# Patient Record
Sex: Male | Born: 1970 | Race: White | Hispanic: No | Marital: Single | Smoking: Former smoker
Health system: Southern US, Community
[De-identification: ages and names within clinical notes are randomized; demographics above are authoritative.]

## PROBLEM LIST (undated history)

## (undated) DIAGNOSIS — J449 Chronic obstructive pulmonary disease, unspecified: Secondary | ICD-10-CM

## (undated) DIAGNOSIS — F909 Attention-deficit hyperactivity disorder, unspecified type: Secondary | ICD-10-CM

## (undated) DIAGNOSIS — R011 Cardiac murmur, unspecified: Secondary | ICD-10-CM

## (undated) DIAGNOSIS — K219 Gastro-esophageal reflux disease without esophagitis: Secondary | ICD-10-CM

## (undated) DIAGNOSIS — J45909 Unspecified asthma, uncomplicated: Secondary | ICD-10-CM

## (undated) HISTORY — PX: OTHER SURGICAL HISTORY: SHX169

---

## 1999-03-24 ENCOUNTER — Emergency Department (HOSPITAL_COMMUNITY): Admission: EM | Admit: 1999-03-24 | Discharge: 1999-03-24 | Payer: Self-pay | Admitting: Emergency Medicine

## 2000-02-27 ENCOUNTER — Emergency Department (HOSPITAL_COMMUNITY): Admission: EM | Admit: 2000-02-27 | Discharge: 2000-02-27 | Payer: Self-pay | Admitting: Emergency Medicine

## 2000-02-27 ENCOUNTER — Encounter: Payer: Self-pay | Admitting: Emergency Medicine

## 2000-03-01 ENCOUNTER — Emergency Department (HOSPITAL_COMMUNITY): Admission: EM | Admit: 2000-03-01 | Discharge: 2000-03-01 | Payer: Self-pay | Admitting: Emergency Medicine

## 2000-12-05 ENCOUNTER — Emergency Department (HOSPITAL_COMMUNITY): Admission: EM | Admit: 2000-12-05 | Discharge: 2000-12-05 | Payer: Self-pay | Admitting: Emergency Medicine

## 2001-06-04 ENCOUNTER — Inpatient Hospital Stay (HOSPITAL_COMMUNITY): Admission: EM | Admit: 2001-06-04 | Discharge: 2001-06-11 | Payer: Self-pay | Admitting: Psychiatry

## 2001-06-04 ENCOUNTER — Encounter: Payer: Self-pay | Admitting: Emergency Medicine

## 2001-06-12 ENCOUNTER — Other Ambulatory Visit (HOSPITAL_COMMUNITY): Admission: RE | Admit: 2001-06-12 | Discharge: 2001-06-21 | Payer: Self-pay | Admitting: Psychiatry

## 2003-03-18 ENCOUNTER — Emergency Department (HOSPITAL_COMMUNITY): Admission: EM | Admit: 2003-03-18 | Discharge: 2003-03-18 | Payer: Self-pay | Admitting: Emergency Medicine

## 2003-03-18 ENCOUNTER — Encounter: Payer: Self-pay | Admitting: Emergency Medicine

## 2006-02-22 ENCOUNTER — Encounter: Payer: Self-pay | Admitting: Emergency Medicine

## 2007-08-21 ENCOUNTER — Emergency Department (HOSPITAL_COMMUNITY): Admission: EM | Admit: 2007-08-21 | Discharge: 2007-08-21 | Payer: Self-pay | Admitting: Emergency Medicine

## 2008-10-16 ENCOUNTER — Emergency Department (HOSPITAL_COMMUNITY): Admission: EM | Admit: 2008-10-16 | Discharge: 2008-10-16 | Payer: Self-pay | Admitting: Emergency Medicine

## 2010-05-21 ENCOUNTER — Observation Stay (HOSPITAL_COMMUNITY): Admission: EM | Admit: 2010-05-21 | Discharge: 2010-05-22 | Payer: Self-pay | Admitting: Emergency Medicine

## 2010-08-02 ENCOUNTER — Emergency Department (HOSPITAL_COMMUNITY): Admission: EM | Admit: 2010-08-02 | Discharge: 2010-08-02 | Payer: Self-pay | Admitting: Emergency Medicine

## 2011-01-08 LAB — CULTURE, BLOOD (ROUTINE X 2): Culture: NO GROWTH

## 2011-01-08 LAB — BASIC METABOLIC PANEL
BUN: 15 mg/dL (ref 6–23)
CO2: 25 mEq/L (ref 19–32)
Calcium: 9.2 mg/dL (ref 8.4–10.5)
Chloride: 104 mEq/L (ref 96–112)
Creatinine, Ser: 0.75 mg/dL (ref 0.4–1.5)
GFR calc Af Amer: >60 mL/min (ref >60)
GFR calc non Af Amer: >60 mL/min (ref >60)
Glucose, Bld: 127 mg/dL — ABNORMAL HIGH (ref 70–99)
Potassium: 4 mEq/L (ref 3.5–5.1)
Sodium: 137 mEq/L (ref 135–145)

## 2011-01-08 LAB — DIFFERENTIAL
Basophils Absolute: 0.1 10*3/uL (ref 0.0–0.1)
Basophils Relative: 1 % (ref 0–1)
Eosinophils Absolute: 0.1 10*3/uL (ref 0.0–0.7)
Eosinophils Relative: 0 % (ref 0–5)
Lymphocytes Relative: 15 % (ref 12–46)
Lymphs Abs: 2.5 10*3/uL (ref 0.7–4.0)
Monocytes Absolute: 1 10*3/uL (ref 0.1–1.0)
Monocytes Relative: 6 % (ref 3–12)
Neutro Abs: 13.3 10*3/uL — ABNORMAL HIGH (ref 1.7–7.7)
Neutrophils Relative %: 78 % — ABNORMAL HIGH (ref 43–77)

## 2011-01-08 LAB — CBC
HCT: 42.1 % (ref 39.0–52.0)
Hemoglobin: 14.4 g/dL (ref 13.0–17.0)
MCH: 32.8 pg (ref 26.0–34.0)
MCHC: 34.2 g/dL (ref 30.0–36.0)
MCV: 95.9 fL (ref 78.0–100.0)
Platelets: 257 10*3/uL (ref 150–400)
RBC: 4.39 MIL/uL (ref 4.22–5.81)
RDW: 14.8 % (ref 11.5–15.5)
WBC: 17 10*3/uL — ABNORMAL HIGH (ref 4.0–10.5)

## 2011-03-08 NOTE — Consult Note (Signed)
[name], [name]           ACCOUNT NO.:  [id number]   MEDICAL RECORD NO.:  [id number]          PATIENT TYPE:  EMS   LOCATION:  ED                           FACILITY:  [facility]   PHYSICIAN:  Randall Post, MD    DATE OF BIRTH:  1971/05/22   DATE OF CONSULTATION:  10/16/2008  DATE OF DISCHARGE:                                 CONSULTATION   CHIEF COMPLAINT:  Right knee laceration.   HISTORY:  [name] is a 40 year old man who cut his right  anterior knee on a storm door.  He works in Production designer, theatre/television/film.  This was not  an on-the-job injury.  He complained of acute sharp pain directly over  the knee.  It hurts when he bends it.  It is better when he keeps it  straight.  He describes the pain is sharp, and it is non-radiating.  He  denies any numbness or any other injuries.  The laceration occurred  almost 18 hours ago.   PAST MEDICAL HISTORY:  No diabetes or cardiac disease.   FAMILY HISTORY:  No diabetes or cardiac disease.   SOCIAL HISTORY:  He smokes one pack per day.  A tobacco intervention  session was performed.  He has a prescription for Chantix, and is  interested in quitting.  He is going to plan to quit after the New Year.  We have discussed the implications of tobacco use and the potential  complications with wound healing.  He understands and is going to quit.   REVIEW OF SYSTEMS:  A 14-point review of systems was performed and was  otherwise negative, with the exception of the musculoskeletal laceration  as above.  He has not had any fevers or chills.   PHYSICAL EXAMINATION:  CONSTITUTIONAL:  He is alert and oriented, in no  acute distress, and is well developed, well nourished.  EYES:  His extraocular movements are intact.  LYMPHATIC:  He has no cervical or axillary lymphadenopathy.  CARDIOVASCULAR:  He has no pedal edema.  RESPIRATORY:  He has no increase in respiratory efforts.  PSYCHIATRIC:  His judgment and insight are intact.  SKIN:  He has a  laceration over the anterior right knee.  NEUROLOGIC:  His sensation is intact distally throughout both lower  extremities.  MUSCULOSKELETAL:  His straight leg raise is intact, and his extensor  mechanism is intact.  He has a laceration down into the prepatellar  bursa.  This does not involve the joint.  He has no joint effusion.   X-RAYS:  X-rays demonstrate soft tissue wound anterior to the patella,  but there is no air in the joint, and no evidence for arthrotomy.  There  is no bony defect   IMPRESSION:  Right prepatellar laceration.   PLAN:  IV antibiotics in the emergency room.  Local anesthetic, followed  by a copious pulse lavage irrigation here in the emergency room.  Loose  wound closure, with p.o. antibiotics to go home, and a knee immobilizer.  Return to clinic with me on Monday for a wound check.      Randall Post, MD  Electronically  Signed     JPL/MEDQ  D:  10/16/2008  T:  10/17/2008  Job:  [id number]
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
## 2012-12-05 ENCOUNTER — Encounter (HOSPITAL_COMMUNITY): Payer: Self-pay | Admitting: *Deleted

## 2012-12-05 ENCOUNTER — Emergency Department (HOSPITAL_COMMUNITY)
Admission: EM | Admit: 2012-12-05 | Discharge: 2012-12-05 | Disposition: A | Discharge status: Home / Self Care | Payer: BC Managed Care – PPO | Attending: Emergency Medicine | Admitting: Emergency Medicine

## 2012-12-05 ENCOUNTER — Emergency Department (HOSPITAL_COMMUNITY): Payer: BC Managed Care – PPO

## 2012-12-05 DIAGNOSIS — F909 Attention-deficit hyperactivity disorder, unspecified type: Secondary | ICD-10-CM | POA: Insufficient documentation

## 2012-12-05 DIAGNOSIS — Z79899 Other long term (current) drug therapy: Secondary | ICD-10-CM | POA: Insufficient documentation

## 2012-12-05 DIAGNOSIS — J45901 Unspecified asthma with (acute) exacerbation: Secondary | ICD-10-CM

## 2012-12-05 DIAGNOSIS — F17200 Nicotine dependence, unspecified, uncomplicated: Secondary | ICD-10-CM | POA: Insufficient documentation

## 2012-12-05 HISTORY — DX: Attention-deficit hyperactivity disorder, unspecified type: F90.9

## 2012-12-05 HISTORY — DX: Unspecified asthma, uncomplicated: J45.909

## 2012-12-05 MED ORDER — PREDNISONE 20 MG PO TABS
60.0000 mg | ORAL_TABLET | Freq: Once | ORAL | Status: AC
  Administered 2012-12-05: 60 mg via ORAL
  Filled 2012-12-05: qty 3

## 2012-12-05 MED ORDER — PREDNISONE 20 MG PO TABS: ORAL_TABLET | ORAL | Status: DC

## 2012-12-05 MED ORDER — ALBUTEROL SULFATE (5 MG/ML) 0.5% IN NEBU
5.0000 mg | INHALATION_SOLUTION | Freq: Once | RESPIRATORY_TRACT | Status: AC
  Administered 2012-12-05: 5 mg via RESPIRATORY_TRACT
  Filled 2012-12-05: qty 1

## 2012-12-05 MED ORDER — IPRATROPIUM BROMIDE 0.02 % IN SOLN
0.5000 mg | Freq: Once | RESPIRATORY_TRACT | Status: AC
  Administered 2012-12-05: 0.5 mg via RESPIRATORY_TRACT
  Filled 2012-12-05: qty 2.5

## 2012-12-05 MED ORDER — ALBUTEROL SULFATE HFA 108 (90 BASE) MCG/ACT IN AERS
2.0000 | INHALATION_SPRAY | RESPIRATORY_TRACT | Status: DC | PRN
  Administered 2012-12-05: 2 via RESPIRATORY_TRACT
  Filled 2012-12-05: qty 6.7

## 2012-12-05 NOTE — ED Notes (Signed)
Pt with hx of asthma to ED c/o increased wheezing and sob x 1 month that has gotten worse in the past 3 days.  Audible wheezing noted, labored breathing, sats of 96%.

## 2012-12-05 NOTE — ED Provider Notes (Signed)
History     CSN: [id number]  Arrival date & time 12/05/12  1719   First MD Initiated Contact with Patient 12/05/12 1733      Chief Complaint  Patient presents with  . Asthma    (Consider location/radiation/quality/duration/timing/severity/associated sxs/prior treatment) Patient is a 42 y.o. male presenting with asthma. The history is provided by the patient.  Asthma This is a recurrent problem. The current episode started more than 1 week ago (Pt has a hard time breathing for about a week.  It has gotten worse in the past 2 days.  He has a history of asthma.). The problem occurs constantly. The problem has been gradually worsening. Associated symptoms include shortness of breath. Nothing aggravates the symptoms. Nothing relieves the symptoms. He has tried nothing for the symptoms.    Past Medical History  Diagnosis Date  . Asthma   . ADHD (attention deficit hyperactivity disorder)     Past Surgical History  Procedure Laterality Date  . Cervical fracture repair      No family history on file.  History  Substance Use Topics  . Smoking status: Current Every Day Smoker -- 1.00 packs/day    Types: Cigarettes  . Smokeless tobacco: Not on file  . Alcohol Use: No      Review of Systems  Constitutional: Negative.   HENT: Negative.   Eyes: Negative.   Respiratory: Positive for shortness of breath.   Cardiovascular: Negative.   Gastrointestinal: Negative.   Endocrine: Negative.   Genitourinary: Negative.   Neurological: Negative.   Psychiatric/Behavioral: Negative.     Allergies  Review of patient's allergies indicates no known allergies.  Home Medications   Current Outpatient Rx  Name  Route  Sig  Dispense  Refill  . albuterol (PROVENTIL HFA;VENTOLIN HFA) 108 (90 BASE) MCG/ACT inhaler   Inhalation   Inhale 2 puffs into the lungs every 6 (six) hours as needed for wheezing. For shortness of breath         . amphetamine-dextroamphetamine (ADDERALL XR) 30 MG 24  hr capsule   Oral   Take 60 mg by mouth every morning.         . citalopram (CELEXA) 10 MG tablet   Oral   Take 10 mg by mouth daily.         Marland Kitchen guaiFENesin (MUCINEX) 600 MG 12 hr tablet   Oral   Take 1,200 mg by mouth 2 (two) times daily.         Marland Kitchen ibuprofen (ADVIL,MOTRIN) 200 MG tablet   Oral   Take 400 mg by mouth every 6 (six) hours as needed for pain. For pain         . Multiple Vitamin (MULTIVITAMIN WITH MINERALS) TABS   Oral   Take 1 tablet by mouth daily.         Marland Kitchen oxymetazoline (AFRIN) 0.05 % nasal spray   Nasal   Place 2 sprays into the nose 2 (two) times daily.           BP 118/81  Pulse 116  Temp(Src) 99 F (37.2 C) (Oral)  Resp 20  SpO2 96%  Physical Exam  Nursing note and vitals reviewed. Constitutional: He is oriented to person, place, and time. He appears well-developed and well-nourished.  With audible wheezing.    HENT:  Head: Normocephalic and atraumatic.  Right Ear: External ear normal.  Left Ear: External ear normal.  Mouth/Throat: Oropharynx is clear and moist.  Eyes: Conjunctivae and EOM are normal. Pupils are  equal, round, and reactive to light.  Neck: Normal range of motion. Neck supple.  Cardiovascular: Normal rate, regular rhythm and normal heart sounds.   Pulmonary/Chest: Effort normal. He has wheezes.  Bilateral wheezing.    Abdominal: Soft. Bowel sounds are normal.  Musculoskeletal: Normal range of motion. He exhibits no edema and no tenderness.  Neurological: He is alert and oriented to person, place, and time.  No sensory or motor deficit.  Skin: Skin is warm and dry.  Psychiatric: He has a normal mood and affect. His behavior is normal.    ED Course  Procedures (including critical care time)  Labs Reviewed - No data to display Dg Chest 2 View  12/05/2012  *RADIOLOGY REPORT*  Clinical Data: Asthma and shortness of breath.  CHEST - 2 VIEW  Comparison: None.  Findings: The heart size is normal.  Mild perihilar  bronchitic change is present.  No focal airspace consolidation is evident. The visualized soft tissues and bony thorax are unremarkable.  IMPRESSION:  1.  Mild perihilar bronchitic changes are compatible with asthma. 2.  No focal airspace consolidation.   Original Report Authenticated By: Marin Roberts, M.D.    7:39 PM Has persistent wheezing after two albuterol treatments and 60 mg of prednisone.  Ordered a third nebulizer Rx.    1. Asthma attack            Carleene Cooper III, MD 12/05/12 2120

## 2014-01-05 ENCOUNTER — Emergency Department (HOSPITAL_COMMUNITY): Payer: 59

## 2014-01-05 ENCOUNTER — Encounter (HOSPITAL_COMMUNITY): Payer: Self-pay | Admitting: Emergency Medicine

## 2014-01-05 ENCOUNTER — Emergency Department (HOSPITAL_COMMUNITY)
Admission: EM | Admit: 2014-01-05 | Discharge: 2014-01-05 | Disposition: A | Discharge status: Home / Self Care | Payer: 59 | Attending: Emergency Medicine | Admitting: Emergency Medicine

## 2014-01-05 DIAGNOSIS — F17200 Nicotine dependence, unspecified, uncomplicated: Secondary | ICD-10-CM | POA: Insufficient documentation

## 2014-01-05 DIAGNOSIS — Z8659 Personal history of other mental and behavioral disorders: Secondary | ICD-10-CM | POA: Insufficient documentation

## 2014-01-05 DIAGNOSIS — J45901 Unspecified asthma with (acute) exacerbation: Secondary | ICD-10-CM | POA: Insufficient documentation

## 2014-01-05 DIAGNOSIS — Z79899 Other long term (current) drug therapy: Secondary | ICD-10-CM | POA: Insufficient documentation

## 2014-01-05 DIAGNOSIS — J40 Bronchitis, not specified as acute or chronic: Secondary | ICD-10-CM

## 2014-01-05 LAB — CBC WITH DIFFERENTIAL/PLATELET
Basophils Absolute: 0 10*3/uL (ref 0.0–0.1)
Basophils Relative: 0 % (ref 0–1)
Eosinophils Absolute: 0.2 10*3/uL (ref 0.0–0.7)
Eosinophils Relative: 2 % (ref 0–5)
HCT: 39 % (ref 39.0–52.0)
Hemoglobin: 13.6 g/dL (ref 13.0–17.0)
Lymphocytes Relative: 40 % (ref 12–46)
Lymphs Abs: 4.4 10*3/uL — ABNORMAL HIGH (ref 0.7–4.0)
MCH: 31.4 pg (ref 26.0–34.0)
MCHC: 34.9 g/dL (ref 30.0–36.0)
MCV: 90.1 fL (ref 78.0–100.0)
Monocytes Absolute: 0.8 10*3/uL (ref 0.1–1.0)
Monocytes Relative: 7 % (ref 3–12)
Neutro Abs: 5.7 10*3/uL (ref 1.7–7.7)
Neutrophils Relative %: 51 % (ref 43–77)
Platelets: 312 10*3/uL (ref 150–400)
RBC: 4.33 MIL/uL (ref 4.22–5.81)
RDW: 13.6 % (ref 11.5–15.5)
WBC: 11.2 10*3/uL — ABNORMAL HIGH (ref 4.0–10.5)

## 2014-01-05 LAB — COMPREHENSIVE METABOLIC PANEL
ALT: 51 U/L (ref 0–53)
AST: 32 U/L (ref 0–37)
Albumin: 3.6 g/dL (ref 3.5–5.2)
Alkaline Phosphatase: 115 U/L (ref 39–117)
BUN: 19 mg/dL (ref 6–23)
CO2: 30 mEq/L (ref 19–32)
Calcium: 9.4 mg/dL (ref 8.4–10.5)
Chloride: 103 mEq/L (ref 96–112)
Creatinine, Ser: 1.01 mg/dL (ref 0.50–1.35)
GFR calc Af Amer: >90 mL/min (ref >90)
GFR calc non Af Amer: 89 mL/min — ABNORMAL LOW (ref >90)
Glucose, Bld: 98 mg/dL (ref 70–99)
Potassium: 4.5 mEq/L (ref 3.7–5.3)
Sodium: 144 mEq/L (ref 137–147)
Total Bilirubin: <0.2 mg/dL — ABNORMAL LOW (ref 0.3–1.2)
Total Protein: 6.7 g/dL (ref 6.0–8.3)

## 2014-01-05 LAB — TROPONIN I: Troponin I: <0.3 ng/mL (ref <0.30)

## 2014-01-05 MED ORDER — ALBUTEROL SULFATE HFA 108 (90 BASE) MCG/ACT IN AERS
2.0000 | INHALATION_SPRAY | RESPIRATORY_TRACT | Status: DC | PRN
  Administered 2014-01-05: 2 via RESPIRATORY_TRACT
  Filled 2014-01-05: qty 6.7

## 2014-01-05 MED ORDER — AEROCHAMBER PLUS W/MASK MISC
Status: AC
  Administered 2014-01-05: 1
  Filled 2014-01-05: qty 1

## 2014-01-05 MED ORDER — PREDNISONE 20 MG PO TABS: 60.0000 mg | ORAL_TABLET | Freq: Every day | ORAL | Status: DC

## 2014-01-05 MED ORDER — IPRATROPIUM BROMIDE 0.02 % IN SOLN
0.5000 mg | Freq: Once | RESPIRATORY_TRACT | Status: AC
  Administered 2014-01-05: 0.5 mg via RESPIRATORY_TRACT
  Filled 2014-01-05: qty 2.5

## 2014-01-05 MED ORDER — ALBUTEROL SULFATE (2.5 MG/3ML) 0.083% IN NEBU
5.0000 mg | INHALATION_SOLUTION | Freq: Once | RESPIRATORY_TRACT | Status: AC
  Administered 2014-01-05: 5 mg via RESPIRATORY_TRACT
  Filled 2014-01-05: qty 6

## 2014-01-05 MED ORDER — PREDNISONE 20 MG PO TABS
60.0000 mg | ORAL_TABLET | Freq: Once | ORAL | Status: AC
  Administered 2014-01-05: 60 mg via ORAL
  Filled 2014-01-05: qty 3

## 2014-01-05 NOTE — ED Notes (Signed)
PT completed breathing treatment. Still sounds tight.

## 2014-01-05 NOTE — ED Notes (Signed)
Dr. Bernette MayersSheldon notified about respiratory assessment on pt, states will get chest x-ray and assess from there.

## 2014-01-05 NOTE — Discharge Instructions (Signed)
Bronchitis °Bronchitis is inflammation of the airways that extend from the windpipe into the lungs (bronchi). The inflammation often causes mucus to develop, which leads to a cough. If the inflammation becomes severe, it may cause shortness of breath. °CAUSES  °Bronchitis may be caused by:  °· Viral infections.   °· Bacteria.   °· Cigarette smoke.   °· Allergens, pollutants, and other irritants.   °SIGNS AND SYMPTOMS  °The most common symptom of bronchitis is a frequent cough that produces mucus. Other symptoms include: °· Fever.   °· Body aches.   °· Chest congestion.   °· Chills.   °· Shortness of breath.   °· Sore throat.   °DIAGNOSIS  °Bronchitis is usually diagnosed through a medical history and physical exam. Tests, such as chest X-rays, are sometimes done to rule out other conditions.  °TREATMENT  °You may need to avoid contact with whatever caused the problem (smoking, for example). Medicines are sometimes needed. These may include: °· Antibiotics. These may be prescribed if the condition is caused by bacteria. °· Cough suppressants. These may be prescribed for relief of cough symptoms.   °· Inhaled medicines. These may be prescribed to help open your airways and make it easier for you to breathe.   °· Steroid medicines. These may be prescribed for those with recurrent (chronic) bronchitis. °HOME CARE INSTRUCTIONS °· Get plenty of rest.   °· Drink enough fluids to keep your urine clear or pale yellow (unless you have a medical condition that requires fluid restriction). Increasing fluids may help thin your secretions and will prevent dehydration.   °· Only take over-the-counter or prescription medicines as directed by your health care provider. °· Only take antibiotics as directed. Make sure you finish them even if you start to feel better. °· Avoid secondhand smoke, irritating chemicals, and strong fumes. These will make bronchitis worse. If you are a smoker, quit smoking. Consider using nicotine gum or  skin patches to help control withdrawal symptoms. Quitting smoking will help your lungs heal faster.   °· Put a cool-mist humidifier in your bedroom at night to moisten the air. This may help loosen mucus. Change the water in the humidifier daily. You can also run the hot water in your shower and sit in the bathroom with the door closed for 5 10 minutes.   °· Follow up with your health care provider as directed.   °· Wash your hands frequently to avoid catching bronchitis again or spreading an infection to others.   °SEEK MEDICAL CARE IF: °Your symptoms do not improve after 1 week of treatment.  °SEEK IMMEDIATE MEDICAL CARE IF: °· Your fever increases. °· You have chills.   °· You have chest pain.   °· You have worsening shortness of breath.   °· You have bloody sputum. °· You faint.   °· You have lightheadedness. °· You have a severe headache.   °· You vomit repeatedly. °MAKE SURE YOU:  °· Understand these instructions. °· Will watch your condition. °· Will get help right away if you are not doing well or get worse. °Document Released: 10/10/2005 Document Revised: 07/31/2013 Document Reviewed: 06/04/2013 °ExitCare® Patient Information ©2014 [employer]. ° ° °Emergency Department Resource Guide °1) Find a Doctor and Pay Out of Pocket °Although you won't have to find out who is covered by your insurance plan, it is a good idea to ask around and get recommendations. You will then need to call the office and see if the doctor you have chosen will accept you as a new patient and what types of options they offer for patients who are self-pay. Some doctors   offer discounts or will set up payment plans for their patients who do not have insurance, but you will need to ask so you aren't surprised when you get to your appointment. ° °2) Contact Your Local Health Department °Not all health departments have doctors that can see patients for sick visits, but many do, so it is worth a call to see if yours does. If you don't  know where your local health department is, you can check in your phone book. The CDC also has a tool to help you locate your state's health department, and many state websites also have listings of all of their local health departments. ° °3) Find a Walk-in Clinic °If your illness is not likely to be very severe or complicated, you may want to try a walk in clinic. These are popping up all over the country in pharmacies, drugstores, and shopping centers. They're usually staffed by nurse practitioners or physician assistants that have been trained to treat common illnesses and complaints. They're usually fairly quick and inexpensive. However, if you have serious medical issues or chronic medical problems, these are probably not your best option. ° °No Primary Care Doctor: °- Call [facility] at  832-8000 - they can help you locate a primary care doctor that  accepts your insurance, provides certain services, etc. °- Physician Referral Service- 1-800-533-3463 ° °Chronic Pain Problems: °Organization         Address  Phone   Notes  °[facility]  (336) 297-2271 Patients need to be referred by their primary care doctor.  ° °Medication Assistance: °Organization         Address  Phone   Notes  °[place] [facility] [address] °[place], [place] [address] (336) 641-8030 --Must be a resident of [place] °-- Must have NO insurance coverage whatsoever (no Medicaid/ Medicare, etc.) °-- The pt. MUST have a primary care doctor that directs their care regularly and follows them in the community °  °[facility]  (866) 331-1348   °[employer]  (888) 892-1162   ° °Agencies that provide inexpensive medical care: °Organization         Address  Phone   Notes  °[facility]  (336) 832-8035   °[facility]    (336) 832-7272   °[facility] [address] °[place], [place] [address] (336) 832-4777   °[facility] [address], °[place] (336) 271-4999   °[facility]    (336) 373-0678   °[facility]    (336) 272-1050   °[facility] ° [address], [place] Phone:  (336) 832-4444, Fax:  (336) 832-4440 Hours of Operation:  9 am - 6 pm, M-F.  Also accepts Medicaid/Medicare and self-pay.  °[facility] ° [address], [place] Phone: (336) 832-3150, Fax: (336) 832-3151. Hours of Operation:  8:30 am - 5:30 pm, M-F.  Also accepts Medicaid and self-pay.  °[facility] [place] [address], [place] Phone: (336) 878-6027   °[facility] [address], [place], [place] (336)723-1848, Ext. 123 Mondays & Thursdays: 7-9 AM.  First 15 patients are seen on a first come, first serve basis. °  ° °Medicaid-accepting [place] Providers: ° °Organization         Address  Phone   Notes  °[facility] [address], [place] (336) 641-2100 Also accepts self-pay patients.  °[facility] [address], [place] ° (336) 856-9996   °[facility] [address], [place] (336) 288-8857   °[facility] [address] [place] [address], [place] (336) 299-7000   °Veita Bland [address], [place]  ° (336) 373-1557 Only accepts [place] Access Medicaid patients after they have their name applied to their card.  ° °Self-Pay (no insurance) in [place]: ° °Organization         Address  Phone   Notes  °Sickle Cell Patients, [facility] [address], [place] (336) 832-1970   °[facility] [address], [place] (336) 832-4400   °[facility] ° [address], [place] (336) 992-4800   °[facility]/Dr. Osei-Bonsu ° [address] [place] [address], [place] or [address], [place] (336) 841-8500 Phone  number for both [place] and [place] locations is the same.  °[facility] [address], [place] (336) 299-0000   °[facility] [address] [place] [address], [place] or [address] (336) 852-7530 °(336) 878-2260   °[facility] [address], [place] (336) 350-1642, phone; (336) 294-5005, fax Sees patients 1st and 3rd Saturday of every month.  Must not qualify for public or private insurance (i.e. Medicaid, Medicare, [place] Health Choice, Veterans' Benefits) • Household income should be no more than 200% of the poverty level •The clinic cannot treat you if you are pregnant or think you are pregnant • Sexually transmitted diseases are not treated at the clinic.  ° ° °Dental Care: °Organization         Address  Phone  Notes  °[facility] [address], [place] (336) 641-6152 Accepts children up to age 21 who are enrolled in Medicaid or [place] Health Choice; pregnant women with a Medicaid card; and children who have applied for Medicaid or [place] Health Choice, but were declined, whose parents can pay a reduced fee at time of service.  °[facility] [place]  [address], [place] (336) 641-7733 Accepts children up to age 21 who are enrolled in Medicaid or [place] Health Choice; pregnant women with a Medicaid card; and children who have applied for Medicaid or [place] Health Choice, but were declined, whose parents can pay a reduced fee at time of service.  °[facility] ° [address],  (336) 641-4533 Patients are seen by appointment only. Walk-ins are not accepted. [facility] will see patients 18 years of age and older. °Monday - Tuesday (8am-5pm) °Most Wednesdays (8:30-5pm) °$30 per visit, cash only  °[facility] ° [address], [place] (336) 641-4533 Patients are seen by appointment only.  Walk-ins are not accepted. [facility] will see patients 18 years of age and older. °One Wednesday Evening (Monthly: Volunteer Based).  $30 per visit, cash only  °[facility]  (919) 537-3737 for adults; Children under age 4, call Graduate Pediatric Dentistry at ([phone]) [phone]. Children aged 4-14, please call (919) [phone] to request a pediatric application. ° Dental services are provided in all areas of dental care including fillings, crowns and bridges, complete and partial dentures, implants, gum treatment, root canals, and extractions. Preventive care   is also provided. Treatment is provided to both adults and children. °Patients are selected via a lottery and there is often a waiting list. °  °[facility] [address], °[place] ° (336) 763-8833 [email or web address] °  °[facility] [address], [place], [place] (336)723-1848, Ext. 123 Second and Fourth Thursday of each month, opens at 6:30 AM; Clinic ends at 9 AM.  Patients are seen on a first-come first-served basis, and a limited number are seen during each clinic.  ° °[facility] ° [address], [place], [place] (336) 723-7904   Eligibility Requirements °You must have lived in [place], [place], or [place] counties for at least the last three months. °  You cannot be eligible for state or federal sponsored healthcare insurance, including Veterans Administration, Medicaid, or Medicare. °  You generally cannot be eligible for healthcare insurance through your employer.  °  How to apply: °Eligibility screenings are held every Tuesday and Wednesday afternoon from 1:00 pm until 4:00 pm. You do not need an appointment for the interview!  °[facility] [address], [place], [place] 336-631-2330   °[facility]  336-342-8273   °[facility]  336-703-3100   °[facility]  336-570-6415   ° °Behavioral Health  Resources in the Community: °Intensive Outpatient Programs °Organization         Address  Phone  Notes  °[facility] [address], [place], [place] 336-878-6098   °[facility] [address], [place], [place] 336-832-9800   °[facility] [address], [place], [place] ° 336-882-2125   °[facility] [address],  °[place], [place] 1-800-853-5163 or 336-641-4981   °Substance Abuse Resources °Organization         Address  Phone  Notes  °[facility]  336-882-2125   °[facility]  336-784-9470   °[facility]  336-285-9073   °[facility]  336-845-3988   °Residential & Outpatient Substance Abuse Program  1-800-659-3381   °Psychological Services °Organization         Address  Phone  Notes  ° Health  336- 832-9600   °[facility]  336- 378-7881   °[facility] [address], [place] 1-800-853-5163 or 336-641-4981   ° °Mobile Crisis Teams °Organization         Address  Phone  Notes  °[facility], Mobile Crisis Care Unit  1-877-626-1772   °Assertive °[facility] ° [address] [place], [place] 336-834-9664   °Sharon DeEsch [address] °[place] 336-554-5454   ° °Self-Help/Support Groups °Organization         Address  Phone             Notes  °[facility] [place] - variety of support groups  336- 373-1402 Call for more information  °Narcotics Anonymous (NA), [facility] [address], °[place]  2 meetings at this location  ° °Residential Treatment Programs °Organization         Address  Phone  Notes  °[facility] [address],    °[place]  1-866-801-8205   °[facility] ° [address], [place], [place] 704-293-8524   °[facility] [address], [place] 336-845-3988 Admissions: 8am-3pm M-F  °[facility] [address]    °  [place], [place] 336-841-1104   °[facility] [address], [place], [place] 336-379-7146   °[facility] [address]  °[place], [place] 336-285-9073   °[facility] Intensive Outpatient [address], [place], [place] 336-852-3033   °[facility] [address]  °[place], [place] 1-877-615-2722 or 336-784-9470   °[facility] [address] [place], [place] 336-227-7417 Accepts Medicaid  °[facility] [address]  °[place] 1-800-659-3381 Substance Abuse/Addiction Treatment  ° °[place] Behavioral Health Resources °Organization         Address  Phone  Notes  °[facility]  (888) 581-9988   °Julie Brannon, PhD [address] [place], [place]   (336) 349-5553 or (336) 951-0000   °[facility]   [address] °[place], [place] (336) 349-4454   °[facility] [address], [place], [place] (336) 342-8316 Insurance/Medicaid/sponsorship through [facility]  °[facility] [address]                                    [place], [place] (336) 342-8316 Therapy/tele-psych/case  °[facility] [address]  ° [place], [place] (336) 349-2233    °Dr. Arfeen  (336) 349-4544   °[facility] [place]  [employer] [place] [facility] 1) [address], [place] °2) [address], [place] °3)  [address], [place] (336) 349-3220 °(336) 342-7768 ° °(336) 342-8140   °[place] [facility] (336) 342-1394 or (336) 342-3537 (After Hours)    ° ° ° °

## 2014-01-05 NOTE — ED Provider Notes (Signed)
CSN: [id number]     Arrival date & time 01/05/14  1817 History   First MD Initiated Contact with Patient 01/05/14 1838     Chief Complaint  Patient presents with  . Chest Injury     (Consider location/radiation/quality/duration/timing/severity/associated sxs/prior Treatment) HPI Pt with history of mild-intermittent asthma and ongoing tobacco abuse reports 2 days of wheezing, SOB, cough productive of yellow sputum and chest soreness which is worse with coughing. No leg swelling or travel. No history of HTN, DM, HLD or CAD.   Past Medical History  Diagnosis Date  . Asthma   . ADHD (attention deficit hyperactivity disorder)    Past Surgical History  Procedure Laterality Date  . Cervical fracture repair     No family history on file. History  Substance Use Topics  . Smoking status: Current Every Day Smoker -- 1.00 packs/day    Types: Cigarettes  . Smokeless tobacco: Not on file  . Alcohol Use: No    Review of Systems  All other systems reviewed and are negative except as noted in HPI.    Allergies  Review of patient's allergies indicates no known allergies.  Home Medications   Current Outpatient Rx  Name  Route  Sig  Dispense  Refill  . albuterol (PROVENTIL HFA;VENTOLIN HFA) 108 (90 BASE) MCG/ACT inhaler   Inhalation   Inhale 2 puffs into the lungs every 6 (six) hours as needed for wheezing. For shortness of breath         . ibuprofen (ADVIL,MOTRIN) 200 MG tablet   Oral   Take 400 mg by mouth every 6 (six) hours as needed for pain. For pain         . Multiple Vitamin (MULTIVITAMIN WITH MINERALS) TABS   Oral   Take 1 tablet by mouth daily.         Marland Kitchen oxymetazoline (AFRIN) 0.05 % nasal spray   Nasal   Place 2 sprays into the nose 2 (two) times daily.          BP 128/74  Pulse 98  Temp(Src) 98.8 F (37.1 C)  Resp 20  Ht 5\' 7"  (1.702 m)  Wt 159 lb (72.122 kg)  BMI 24.90 kg/m2  SpO2 95% Physical Exam  Nursing note and vitals  reviewed. Constitutional: He is oriented to person, place, and time. He appears well-developed and well-nourished.  HENT:  Head: Normocephalic and atraumatic.  Eyes: EOM are normal. Pupils are equal, round, and reactive to light.  Neck: Normal range of motion. Neck supple.  Cardiovascular: Normal rate, normal heart sounds and intact distal pulses.   Pulmonary/Chest: Effort normal. No respiratory distress. He has wheezes. He has no rales. He exhibits no tenderness.  Abdominal: Bowel sounds are normal. He exhibits no distension. There is no tenderness.  Musculoskeletal: Normal range of motion. He exhibits no edema and no tenderness.  Neurological: He is alert and oriented to person, place, and time. He has normal strength. No cranial nerve deficit or sensory deficit.  Skin: Skin is warm and dry. No rash noted.  Psychiatric: He has a normal mood and affect.    ED Course  Procedures (including critical care time) Labs Review Labs Reviewed  CBC WITH DIFFERENTIAL - Abnormal; Notable for the following:    WBC 11.2 (*)    Lymphs Abs 4.4 (*)    All other components within normal limits  COMPREHENSIVE METABOLIC PANEL - Abnormal; Notable for the following:    Total Bilirubin <0.2 (*)    GFR calc  non Af Amer 89 (*)    All other components within normal limits  TROPONIN I   Imaging Review Dg Chest 2 View  01/05/2014   CLINICAL DATA:  Pain post trauma  EXAM: CHEST  2 VIEW  COMPARISON:  December 05, 2012  FINDINGS: The lungs are clear. The heart size and pulmonary vascularity are normal. No adenopathy. No pneumothorax. No bone lesions. There is a linear metallic foreign body either overlying or in the lower neck midline.  IMPRESSION: The near metallic foreign body either overlying or in the lower midline neck region. Lungs clear. No pneumothorax.   Electronically Signed   By: Bretta BangWilliam  Woodruff M.D.   On: 01/05/2014 20:10  metal from previous cervical spine surgery  EKG not in MUSE:  Date:  01/05/2014  Rate: 106  Rhythm: sinus tachycardia  QRS Axis: normal  Intervals: normal  ST/T Wave abnormalities: normal  Conduction Disutrbances:right bundle branch block, incomplete  Narrative Interpretation:   Old EKG Reviewed: none available    MDM   Final diagnoses:  Bronchitis    Pt with symptoms consistent with asthma/bronchitis. Labs and CXR ordered in triage are in process, in the meantime will give a neb and prednisone and reassess for symptom improvement.   8:54 PM Labs and imaging reviewed and unremarkable. Low risk for CAD, PE or other acute life threatening cardiac or pulmonary process. Likely a viral bronchitis, advised HFA, prednisone, stopping smoking and establishing with a PCP.   Charles B. Bernette MayersSheldon, MD 01/05/14 2055

## 2014-01-05 NOTE — ED Notes (Signed)
The pt is c/o mid-chest pain for 2 days with sob.  Productive cough.  He has a cold

## 2014-10-30 ENCOUNTER — Emergency Department (HOSPITAL_COMMUNITY)
Admission: EM | Admit: 2014-10-30 | Discharge: 2014-10-30 | Disposition: A | Discharge status: Home / Self Care | Payer: 59 | Source: Home / Self Care | Attending: Emergency Medicine | Admitting: Emergency Medicine

## 2014-10-30 ENCOUNTER — Ambulatory Visit (HOSPITAL_COMMUNITY): Payer: 59 | Attending: Emergency Medicine

## 2014-10-30 ENCOUNTER — Encounter (HOSPITAL_COMMUNITY): Payer: Self-pay | Admitting: *Deleted

## 2014-10-30 DIAGNOSIS — S99921A Unspecified injury of right foot, initial encounter: Secondary | ICD-10-CM

## 2014-10-30 DIAGNOSIS — R05 Cough: Secondary | ICD-10-CM | POA: Insufficient documentation

## 2014-10-30 DIAGNOSIS — R918 Other nonspecific abnormal finding of lung field: Secondary | ICD-10-CM | POA: Insufficient documentation

## 2014-10-30 DIAGNOSIS — R509 Fever, unspecified: Secondary | ICD-10-CM | POA: Diagnosis present

## 2014-10-30 DIAGNOSIS — J111 Influenza due to unidentified influenza virus with other respiratory manifestations: Secondary | ICD-10-CM

## 2014-10-30 DIAGNOSIS — S99929A Unspecified injury of unspecified foot, initial encounter: Secondary | ICD-10-CM

## 2014-10-30 DIAGNOSIS — R059 Cough, unspecified: Secondary | ICD-10-CM

## 2014-10-30 LAB — POCT RAPID STREP A: Streptococcus, Group A Screen (Direct): NEGATIVE

## 2014-10-30 MED ORDER — IPRATROPIUM-ALBUTEROL 0.5-2.5 (3) MG/3ML IN SOLN
RESPIRATORY_TRACT | Status: AC
  Filled 2014-10-30: qty 3

## 2014-10-30 MED ORDER — OSELTAMIVIR PHOSPHATE 75 MG PO CAPS: 75.0000 mg | ORAL_CAPSULE | Freq: Two times a day (BID) | ORAL | Status: DC

## 2014-10-30 MED ORDER — ACETAMINOPHEN 325 MG PO TABS
650.0000 mg | ORAL_TABLET | Freq: Once | ORAL | Status: AC
  Administered 2014-10-30: 650 mg via ORAL

## 2014-10-30 MED ORDER — IPRATROPIUM-ALBUTEROL 0.5-2.5 (3) MG/3ML IN SOLN
3.0000 mL | Freq: Once | RESPIRATORY_TRACT | Status: AC
  Administered 2014-10-30: 3 mL via RESPIRATORY_TRACT

## 2014-10-30 MED ORDER — HYDROCODONE-HOMATROPINE 5-1.5 MG/5ML PO SYRP: 5.0000 mL | ORAL_SOLUTION | Freq: Four times a day (QID) | ORAL | Status: DC | PRN

## 2014-10-30 MED ORDER — ACETAMINOPHEN 325 MG PO TABS
ORAL_TABLET | ORAL | Status: AC
  Filled 2014-10-30: qty 2

## 2014-10-30 MED ORDER — ALBUTEROL SULFATE HFA 108 (90 BASE) MCG/ACT IN AERS: 2.0000 | INHALATION_SPRAY | RESPIRATORY_TRACT | Status: DC | PRN

## 2014-10-30 NOTE — Discharge Instructions (Signed)
You have the flu. Take Tamiflu 1 pill twice a day for 5 days. Use the albuterol inhaler or nebulizer every 4 hours as needed. Use Hycodan cough syrup as needed for cough. This medicine has a narcotic in it so do not drive all taking it. You should start to feel better in the next 3-4 days. The fatigue may last longer.  If you develop trouble breathing, chest pain, are getting worse please go to the emergency room.

## 2014-10-30 NOTE — ED Notes (Addendum)
Pt  Reports     Non  Productive   Cough         X  3  Days  With  sorethroat   /   Body  Aches              He     Is  Sitting  Upright on  The  Exam table          Pt  Masked  And  Is  In a  Private  room

## 2014-10-30 NOTE — ED Provider Notes (Signed)
CSN: [id number]     Arrival date & time 10/30/14  1157 History   None    Chief Complaint  Patient presents with  . Fever   (Consider location/radiation/quality/duration/timing/severity/associated sxs/prior Treatment) HPI  He is a 44 year old man here with his wife for evaluation of fever and cough. His symptoms started about 3 days ago with fever, cough, sore throat, congestion. His symptoms are gradually worsening. He has had an episode of vomiting yesterday as well as several episodes of diarrhea. He reports some stomach pain with coughing.  He also reports headaches and body aches. He did not get a flu shot this year. He is a smoker. He has not had any cigarettes in the last 4 days and is planning on quitting.  He also would like to be evaluated for right toe pain. He was going to the bathroom last night when he kicked the door. He has pain and bruising in his right little toe.  Past Medical History  Diagnosis Date  . Asthma   . ADHD (attention deficit hyperactivity disorder)    Past Surgical History  Procedure Laterality Date  . Cervical fracture repair     History reviewed. No pertinent family history. History  Substance Use Topics  . Smoking status: Current Every Day Smoker -- 1.00 packs/day    Types: Cigarettes  . Smokeless tobacco: Not on file  . Alcohol Use: No    Review of Systems  Constitutional: Positive for fever. Negative for chills.  HENT: Positive for congestion, rhinorrhea and sore throat.   Respiratory: Positive for cough and wheezing. Negative for shortness of breath.   Cardiovascular: Negative for chest pain.  Gastrointestinal: Positive for vomiting, abdominal pain and diarrhea.  Musculoskeletal: Positive for myalgias.  Neurological: Positive for headaches. Negative for dizziness.    Allergies  Review of patient's allergies indicates no known allergies.  Home Medications   Prior to Admission medications   Medication Sig Start Date End Date Taking?  Authorizing Provider  albuterol (PROVENTIL HFA;VENTOLIN HFA) 108 (90 BASE) MCG/ACT inhaler Inhale 2 puffs into the lungs every 4 (four) hours as needed for wheezing or shortness of breath. 10/30/14   Charm RingsErin J Ola Raap, MD  HYDROcodone-homatropine (HYCODAN) 5-1.5 MG/5ML syrup Take 5 mLs by mouth every 6 (six) hours as needed for cough. 10/30/14   Charm RingsErin J Ladan Vanderzanden, MD  ibuprofen (ADVIL,MOTRIN) 200 MG tablet Take 400 mg by mouth every 6 (six) hours as needed for pain. For pain    Historical Provider, MD  Multiple Vitamin (MULTIVITAMIN WITH MINERALS) TABS Take 1 tablet by mouth daily.    Historical Provider, MD  oseltamivir (TAMIFLU) 75 MG capsule Take 1 capsule (75 mg total) by mouth every 12 (twelve) hours. 10/30/14   Charm RingsErin J Keysean Savino, MD  predniSONE (DELTASONE) 20 MG tablet Take 3 tablets (60 mg total) by mouth daily with breakfast. 01/05/14   Charles B. Bernette MayersSheldon, MD  RANITIDINE HCL PO Take 1 tablet by mouth daily as needed (for indigestion).    Historical Provider, MD   BP 118/73 mmHg  Pulse 147  Temp(Src) 103 F (39.4 C) (Oral)  Resp 16  SpO2 96% Physical Exam  Constitutional: He is oriented to person, place, and time. He appears well-developed and well-nourished. No distress.  HENT:  Head: Normocephalic and atraumatic.  Right Ear: External ear normal.  Left Ear: External ear normal.  Nose: Nose normal.  Mouth/Throat: Mucous membranes are not dry. Oropharyngeal exudate and posterior oropharyngeal erythema present.  Neck: Neck supple.  Cardiovascular: Normal rate,  regular rhythm and normal heart sounds.   No murmur heard. Pulmonary/Chest: Effort normal. No respiratory distress. He has wheezes (worse in the bases). He has no rales.  Bibasilar rhonchi  Lymphadenopathy:    He has no cervical adenopathy.  Neurological: He is alert and oriented to person, place, and time.    ED Course  Procedures (including critical care time) Labs Review Labs Reviewed  POCT RAPID STREP A [facility] URG CARE ONLY)    Imaging  Review Dg Chest 2 View  10/30/2014   CLINICAL DATA:  Cough.  Fever.  EXAM: CHEST  2 VIEW  COMPARISON:  01/05/2014  FINDINGS: There is peribronchial thickening consistent with bronchitis. There are no infiltrates or effusions. Heart size and vascularity are normal. No acute osseous abnormality.  IMPRESSION: Bronchitic changes.   Electronically Signed   By: Geanie Cooley M.D.   On: 10/30/2014 15:02   Dg Toe 5th Right  10/30/2014   CLINICAL DATA:  Stubbed right fifth toe yesterday, right fifth toe pain  EXAM: RIGHT FIFTH TOE  COMPARISON:  None.  FINDINGS: There is no evidence of fracture or dislocation. There is no evidence of arthropathy or other focal bone abnormality. Soft tissues are unremarkable.  IMPRESSION: Negative.   Electronically Signed   By: Natasha Mead M.D.   On: 10/30/2014 14:59     MDM   1. Influenza   2. Toe injury   3. Cough   4. Fever    Tylenol 650 mg given orally for fever. Also treated with a DuoNeb.  Subjective improvement after DuoNeb.  X-ray negative for pneumonia. Toe is not broken. Symptomatically treatment for toe with ice and ibuprofen. He can buddy tape it for comfort if needed. He likely has the flu. Treat with Tamiflu for 5 days. Albuterol and Hycodan given for symptomatic control. Follow-up if symptoms worsen, develops shortness of breath, or chest pain.    Charm Rings, MD 10/30/14 [phone]

## 2014-11-01 LAB — CULTURE, GROUP A STREP

## 2017-01-09 DIAGNOSIS — J018 Other acute sinusitis: Secondary | ICD-10-CM | POA: Diagnosis not present

## 2017-05-17 DIAGNOSIS — J189 Pneumonia, unspecified organism: Secondary | ICD-10-CM | POA: Diagnosis not present

## 2017-06-29 DIAGNOSIS — J0140 Acute pansinusitis, unspecified: Secondary | ICD-10-CM | POA: Diagnosis not present

## 2017-06-29 DIAGNOSIS — J209 Acute bronchitis, unspecified: Secondary | ICD-10-CM | POA: Diagnosis not present

## 2017-06-29 DIAGNOSIS — J189 Pneumonia, unspecified organism: Secondary | ICD-10-CM | POA: Diagnosis not present

## 2017-07-05 DIAGNOSIS — Z23 Encounter for immunization: Secondary | ICD-10-CM | POA: Diagnosis not present

## 2018-09-22 ENCOUNTER — Encounter (HOSPITAL_COMMUNITY): Payer: Self-pay | Admitting: Emergency Medicine

## 2018-09-22 ENCOUNTER — Emergency Department (HOSPITAL_COMMUNITY): Payer: BLUE CROSS/BLUE SHIELD

## 2018-09-22 ENCOUNTER — Other Ambulatory Visit: Payer: Self-pay

## 2018-09-22 ENCOUNTER — Emergency Department (HOSPITAL_COMMUNITY)
Admission: EM | Admit: 2018-09-22 | Discharge: 2018-09-22 | Disposition: A | Discharge status: Home / Self Care | Payer: BLUE CROSS/BLUE SHIELD | Attending: Emergency Medicine | Admitting: Emergency Medicine

## 2018-09-22 ENCOUNTER — Ambulatory Visit (HOSPITAL_COMMUNITY)
Admission: EM | Admit: 2018-09-22 | Discharge: 2018-09-22 | Disposition: A | Discharge status: Nursing Home (Includes ICF) | Payer: Self-pay

## 2018-09-22 DIAGNOSIS — F17210 Nicotine dependence, cigarettes, uncomplicated: Secondary | ICD-10-CM | POA: Insufficient documentation

## 2018-09-22 DIAGNOSIS — J069 Acute upper respiratory infection, unspecified: Secondary | ICD-10-CM | POA: Diagnosis not present

## 2018-09-22 DIAGNOSIS — R079 Chest pain, unspecified: Secondary | ICD-10-CM | POA: Diagnosis not present

## 2018-09-22 DIAGNOSIS — R05 Cough: Secondary | ICD-10-CM | POA: Diagnosis not present

## 2018-09-22 DIAGNOSIS — J4541 Moderate persistent asthma with (acute) exacerbation: Secondary | ICD-10-CM | POA: Diagnosis not present

## 2018-09-22 DIAGNOSIS — Z79899 Other long term (current) drug therapy: Secondary | ICD-10-CM | POA: Insufficient documentation

## 2018-09-22 DIAGNOSIS — R0602 Shortness of breath: Secondary | ICD-10-CM | POA: Diagnosis not present

## 2018-09-22 LAB — BASIC METABOLIC PANEL
Anion gap: 6 (ref 5–15)
BUN: 18 mg/dL (ref 6–20)
CO2: 25 mmol/L (ref 22–32)
Calcium: 9.1 mg/dL (ref 8.9–10.3)
Chloride: 104 mmol/L (ref 98–111)
Creatinine, Ser: 0.93 mg/dL (ref 0.61–1.24)
GFR calc Af Amer: >60 mL/min (ref >60)
GFR calc non Af Amer: >60 mL/min (ref >60)
Glucose, Bld: 99 mg/dL (ref 70–99)
Potassium: 3.9 mmol/L (ref 3.5–5.1)
Sodium: 135 mmol/L (ref 135–145)

## 2018-09-22 LAB — CBC WITH DIFFERENTIAL/PLATELET
Abs Immature Granulocytes: 0.02 10*3/uL (ref 0.00–0.07)
Basophils Absolute: 0 10*3/uL (ref 0.0–0.1)
Basophils Relative: 0 %
Eosinophils Absolute: 0.3 10*3/uL (ref 0.0–0.5)
Eosinophils Relative: 3 %
HCT: 41.7 % (ref 39.0–52.0)
Hemoglobin: 13.2 g/dL (ref 13.0–17.0)
Immature Granulocytes: 0 %
Lymphocytes Relative: 31 %
Lymphs Abs: 2.7 10*3/uL (ref 0.7–4.0)
MCH: 29.1 pg (ref 26.0–34.0)
MCHC: 31.7 g/dL (ref 30.0–36.0)
MCV: 91.9 fL (ref 80.0–100.0)
Monocytes Absolute: 0.7 10*3/uL (ref 0.1–1.0)
Monocytes Relative: 8 %
Neutro Abs: 5.1 10*3/uL (ref 1.7–7.7)
Neutrophils Relative %: 58 %
Platelets: 331 10*3/uL (ref 150–400)
RBC: 4.54 MIL/uL (ref 4.22–5.81)
RDW: 14 % (ref 11.5–15.5)
WBC: 8.9 10*3/uL (ref 4.0–10.5)
nRBC: 0 % (ref 0.0–0.2)

## 2018-09-22 LAB — I-STAT TROPONIN, ED: Troponin i, poc: 0 ng/mL (ref 0.00–0.08)

## 2018-09-22 MED ORDER — ALBUTEROL SULFATE HFA 108 (90 BASE) MCG/ACT IN AERS
2.0000 | INHALATION_SPRAY | Freq: Once | RESPIRATORY_TRACT | Status: AC
  Administered 2018-09-22: 2 via RESPIRATORY_TRACT
  Filled 2018-09-22: qty 6.7

## 2018-09-22 MED ORDER — SODIUM CHLORIDE 0.9 % IV BOLUS
1000.0000 mL | Freq: Once | INTRAVENOUS | Status: AC
  Administered 2018-09-22: 1000 mL via INTRAVENOUS

## 2018-09-22 MED ORDER — PREDNISONE 20 MG PO TABS: ORAL_TABLET | ORAL | 0 refills | Status: DC

## 2018-09-22 MED ORDER — PREDNISONE 20 MG PO TABS
60.0000 mg | ORAL_TABLET | Freq: Once | ORAL | Status: AC
  Administered 2018-09-22: 60 mg via ORAL
  Filled 2018-09-22: qty 3

## 2018-09-22 MED ORDER — IPRATROPIUM BROMIDE 0.02 % IN SOLN
0.5000 mg | Freq: Once | RESPIRATORY_TRACT | Status: AC
  Administered 2018-09-22: 0.5 mg via RESPIRATORY_TRACT
  Filled 2018-09-22: qty 2.5

## 2018-09-22 MED ORDER — ALBUTEROL SULFATE (2.5 MG/3ML) 0.083% IN NEBU
5.0000 mg | INHALATION_SOLUTION | Freq: Once | RESPIRATORY_TRACT | Status: AC
  Administered 2018-09-22: 5 mg via RESPIRATORY_TRACT
  Filled 2018-09-22: qty 6

## 2018-09-22 NOTE — ED Triage Notes (Signed)
Patient arrived from home, c/o of SOB and CP 4/10, staes he checked his SpO2 at [facility] and it was 87%. Hx of asthma, did a albuterol treatment PTA. NAD. AOx4.

## 2018-09-22 NOTE — ED Provider Notes (Signed)
[facility] Provider Note   CSN: [id number] Arrival date & time: 09/22/18  1542     History   Chief Complaint Chief Complaint  Patient presents with  . Shortness of Breath  . Chest Pain    HPI [name] is a 47 y.o. male.  HPI  47 year old male with a history of asthma and prior smoking presents with shortness of breath, cough, and chest tightness.  This started around 1 week ago and has significant he worsened starting 3 days ago.  He is been having chest congestion and wheezing.  A little bit of a sore throat a couple days ago but that has resolved.  No leg swelling.  No fevers at this time.  [facility] with an oxygen saturation of 87% and told to come to the ER.  He is been feeling short of breath more and more and has significant trouble with exertion.  Past Medical History:  Diagnosis Date  . ADHD (attention deficit hyperactivity disorder)   . Asthma     There are no active problems to display for this patient.   Past Surgical History:  Procedure Laterality Date  . cervical fracture repair          Home Medications    Prior to Admission medications   Medication Sig Start Date End Date Taking? Authorizing Provider  albuterol (PROVENTIL HFA;VENTOLIN HFA) 108 (90 BASE) MCG/ACT inhaler Inhale 2 puffs into the lungs every 4 (four) hours as needed for wheezing or shortness of breath. 10/30/14   Charm RingsHonig, Erin J, MD  HYDROcodone-homatropine [facility](HYCODAN) 5-1.5 MG/5ML syrup Take 5 mLs by mouth every 6 (six) hours as needed for cough. 10/30/14   Charm RingsHonig, Erin J, MD  ibuprofen (ADVIL,MOTRIN) 200 MG tablet Take 400 mg by mouth every 6 (six) hours as needed for pain. For pain    [provider]  Multiple Vitamin (MULTIVITAMIN WITH MINERALS) TABS Take 1 tablet by mouth daily.    [provider]  oseltamivir (TAMIFLU) 75 MG capsule Take 1 capsule (75 mg total) by mouth every 12 (twelve)  hours. 10/30/14   Charm RingsHonig, Erin J, MD  predniSONE (DELTASONE) 20 MG tablet 2 tabs po daily x 4 days 09/23/18   Pricilla LovelessGoldston, Naida Escalante, MD  RANITIDINE HCL PO Take 1 tablet by mouth daily as needed (for indigestion).    [provider]    Family History History reviewed. No pertinent family history.  Social History Social History   Tobacco Use  . Smoking status: Current Every Day Smoker    Packs/day: 1.00    Types: Cigarettes  Substance Use Topics  . Alcohol use: No  . Drug use: No     Allergies   Patient has no known allergies.   Review of Systems Review of Systems  Constitutional: Negative for fever.  HENT: Positive for congestion and sore throat.   Respiratory: Positive for cough, chest tightness, shortness of breath and wheezing.   Cardiovascular: Negative for chest pain and leg swelling.  All other systems reviewed and are negative.    Physical Exam Updated Vital Signs BP 124/70   Pulse 85   Temp 98.4 F (36.9 C) (Oral)   Resp 17   Ht 5\' 6"  (1.676 m)   Wt 84.4 kg   SpO2 98%   BMI 30.02 kg/m   Physical Exam  Constitutional: He appears well-developed and well-nourished.  Non-toxic appearance. He does not appear ill. No distress.  HENT:  Head: Normocephalic and atraumatic.  Right Ear: External ear normal.  Left Ear: External ear normal.  Nose: Nose normal.  Eyes: Right eye exhibits no discharge. Left eye exhibits no discharge.  Neck: Neck supple.  Cardiovascular: Normal rate, regular rhythm and normal heart sounds.  Pulmonary/Chest: Effort normal. No accessory muscle usage. No tachypnea. He has wheezes (diffuse, expiratory).  Abdominal: Soft. There is no tenderness.  Musculoskeletal: He exhibits no edema.       Right lower leg: He exhibits no edema.       Left lower leg: He exhibits no edema.  Neurological: He is alert.  Skin: Skin is warm and dry.  Psychiatric: His mood appears not anxious.  Nursing note and vitals reviewed.    ED Treatments /  Results  Labs (all labs ordered are listed, but only abnormal results are displayed) Labs Reviewed  BASIC METABOLIC PANEL  CBC WITH DIFFERENTIAL/PLATELET  I-STAT TROPONIN, ED    EKG EKG Interpretation  Date/Time:  Saturday September 22 2018 15:53:29 EST Ventricular Rate:  84 PR Interval:    QRS Duration: 122 QT Interval:  359 QTC Calculation: 425 R Axis:   79 Text Interpretation:  Sinus rhythm Right bundle branch block similar to earlier in the day Confirmed by Pricilla Loveless [phone]) on 09/22/2018 4:19:00 PM   Radiology Dg Chest 2 View  Result Date: 09/22/2018 CLINICAL DATA:  Shortness of breath with chest tightness and cough 1 week. EXAM: CHEST - 2 VIEW COMPARISON:  10/30/2014 FINDINGS: Lungs are adequately inflated without focal airspace consolidation or effusion. Cardiomediastinal silhouette and remainder of the exam is unchanged. IMPRESSION: No active cardiopulmonary disease. Electronically Signed   By: Elberta Fortis M.D.   On: 09/22/2018 17:13    Procedures Procedures (including critical care time)  Medications Ordered in ED Medications  albuterol (PROVENTIL HFA;VENTOLIN HFA) 108 (90 Base) MCG/ACT inhaler 2 puff (has no administration in time range)  sodium chloride 0.9 % bolus 1,000 mL (0 mLs Intravenous Stopped 09/22/18 1732)  predniSONE (DELTASONE) tablet 60 mg (60 mg Oral Given 09/22/18 1607)  albuterol (PROVENTIL) (2.5 MG/3ML) 0.083% nebulizer solution 5 mg (5 mg Nebulization Given 09/22/18 1608)  albuterol (PROVENTIL) (2.5 MG/3ML) 0.083% nebulizer solution 5 mg (5 mg Nebulization Given 09/22/18 1732)  ipratropium (ATROVENT) nebulizer solution 0.5 mg (0.5 mg Nebulization Given 09/22/18 1732)     Initial Impression / Assessment and Plan / ED Course  I have reviewed the triage vital signs and the nursing notes.  Pertinent labs & imaging results that were available during my care of the patient were reviewed by me and considered in my medical decision making (see  chart for details).     Patient feels better with albuterol treatments.  His symptoms appear to be bronchitis/asthma exacerbation.  He has some chest tightness but I doubt ACS.  Given length of symptoms with negative troponin I do not think further troponin testing is needed.  I doubt PE or dissection as well.  He has not been in hypoxic while in the emergency department.  He walked to the bathroom and felt well.  No indication for antibiotics as there is no obvious bacterial illness.  He appears stable for discharge home and has been given an albuterol inhaler and started on steroid burst.  We discussed the need to follow-up with a PCP as well as return precautions.  Final Clinical Impressions(s) / ED Diagnoses   Final diagnoses:  Moderate persistent asthma with exacerbation    ED Discharge Orders  Ordered    predniSONE (DELTASONE) 20 MG tablet     09/22/18 1841           Pricilla Loveless, MD 09/22/18 1853

## 2018-09-22 NOTE — ED Notes (Signed)
ED Provider at bedside. 

## 2018-09-22 NOTE — Discharge Instructions (Signed)
Use the albuterol inhaler 1-2 puffs every 4 hours as needed for cough or shortness of breath.  If you develop worsening shortness of breath, cough, dizziness, fever, chest pain, or any other new/concerning symptoms and return to the ER for evaluation.

## 2018-09-22 NOTE — ED Notes (Signed)
Patient verbalizes understanding of discharge instructions. Opportunity for questioning and answers were provided. Armband removed by staff, pt discharged from ED.  

## 2018-09-22 NOTE — ED Notes (Signed)
Pt presents with chest tightness and pain, weakness, nausea and low O2 sats.  Provider notified, EKG done and pt put on 6 Lts of oxygen to get O2 sats up  Pt wheeled down to ED per provider orders

## 2018-10-11 ENCOUNTER — Encounter: Payer: Self-pay | Admitting: Family Medicine

## 2018-10-11 ENCOUNTER — Ambulatory Visit (INDEPENDENT_AMBULATORY_CARE_PROVIDER_SITE_OTHER): Payer: BLUE CROSS/BLUE SHIELD | Admitting: Family Medicine

## 2018-10-11 VITALS — BP 112/68 | HR 88 | Temp 97.8°F | Ht 66.0 in | Wt 170.0 lb

## 2018-10-11 DIAGNOSIS — J4541 Moderate persistent asthma with (acute) exacerbation: Secondary | ICD-10-CM

## 2018-10-11 DIAGNOSIS — J3489 Other specified disorders of nose and nasal sinuses: Secondary | ICD-10-CM

## 2018-10-11 DIAGNOSIS — K219 Gastro-esophageal reflux disease without esophagitis: Secondary | ICD-10-CM

## 2018-10-11 DIAGNOSIS — Z8739 Personal history of other diseases of the musculoskeletal system and connective tissue: Secondary | ICD-10-CM

## 2018-10-11 DIAGNOSIS — M1990 Unspecified osteoarthritis, unspecified site: Secondary | ICD-10-CM | POA: Diagnosis not present

## 2018-10-11 DIAGNOSIS — F1011 Alcohol abuse, in remission: Secondary | ICD-10-CM

## 2018-10-11 DIAGNOSIS — Z87891 Personal history of nicotine dependence: Secondary | ICD-10-CM

## 2018-10-11 DIAGNOSIS — J302 Other seasonal allergic rhinitis: Secondary | ICD-10-CM

## 2018-10-11 DIAGNOSIS — R062 Wheezing: Secondary | ICD-10-CM | POA: Diagnosis not present

## 2018-10-11 DIAGNOSIS — Z7689 Persons encountering health services in other specified circumstances: Secondary | ICD-10-CM

## 2018-10-11 MED ORDER — IPRATROPIUM-ALBUTEROL 0.5-2.5 (3) MG/3ML IN SOLN
3.0000 mL | Freq: Once | RESPIRATORY_TRACT | Status: AC
  Administered 2018-10-11: 3 mL via RESPIRATORY_TRACT

## 2018-10-11 MED ORDER — FLUTICASONE-SALMETEROL 100-50 MCG/DOSE IN AEPB: 1.0000 | INHALATION_SPRAY | Freq: Two times a day (BID) | RESPIRATORY_TRACT | 3 refills | Status: DC

## 2018-10-11 NOTE — Progress Notes (Signed)
Patient presents to clinic today to establish care.  SUBJECTIVE: PMH: Pt is a 47 yo male with pmh sig for asthma, arthritis, seasonal allergies, GERD, h/o addiction.  Patient has not had a PCP.  Asthma: -Diagnosed in [id number] -Seen in ED on 09/22/2018, given albuterol inhaler -Endorses increased wheezing, coughing -Using albuterol as needed -Not on any other asthma medications -Never seen a pulmonologist  Arthritis: -History of broken neck in 1989 -Also with pain in hands, knees -Takes Motrin as needed  GERD: -Was taking Zantac every day -Now taking apple cider vinegar which has improved symptoms -ACV has also helped gout in the right toe  Seasonal allergies: -May take Claritin as needed -Endorses nasal drainage  H/o alcohol abuse: -Patient endorses history of alcohol abuse. -Denies substance abuse -Sober x9 years  H/o Tobacco abuse: -Quit 3 years ago -Was smoking 1 pack/day since early teen years. -finally "allowed" to smoke at age 47.  Allergies: NKDA  Past surgical history: Surgical repair for C5 fracture  Social history: Patient is single.  He is accompanied by his girlfriend.  Patient works for a living.  Patient denies current alcohol, tobacco, or drug use.  Health Maintenance: Dental --needs to find a dentist Immunizations --influenza vaccine October 2019  Past Medical History:  Diagnosis Date  . ADHD (attention deficit hyperactivity disorder)   . Asthma     Past Surgical History:  Procedure Laterality Date  . cervical fracture repair      Current Outpatient Medications on File Prior to Visit  Medication Sig Dispense Refill  . albuterol (PROVENTIL HFA;VENTOLIN HFA) 108 (90 BASE) MCG/ACT inhaler Inhale 2 puffs into the lungs every 4 (four) hours as needed for wheezing or shortness of breath. 1 Inhaler 0   No current facility-administered medications on file prior to visit.     No Known Allergies  History reviewed. No pertinent family  history.  Social History   Socioeconomic History  . Marital status: Legally Separated    Spouse name: Not on file  . Number of children: Not on file  . Years of education: Not on file  . Highest education level: Not on file  Occupational History  . Not on file  Social Needs  . Financial resource strain: Not on file  . Food insecurity:    Worry: Not on file    Inability: Not on file  . Transportation needs:    Medical: Not on file    Non-medical: Not on file  Tobacco Use  . Smoking status: Current Every Day Smoker    Packs/day: 1.00    Types: Cigarettes  . Smokeless tobacco: Never Used  Substance and Sexual Activity  . Alcohol use: No  . Drug use: No  . Sexual activity: Not on file  Lifestyle  . Physical activity:    Days per week: Not on file    Minutes per session: Not on file  . Stress: Not on file  Relationships  . Social connections:    Talks on phone: Not on file    Gets together: Not on file    Attends religious service: Not on file    Active member of club or organization: Not on file    Attends meetings of clubs or organizations: Not on file    Relationship status: Not on file  . Intimate partner violence:    Fear of current or ex partner: Not on file    Emotionally abused: Not on file    Physically abused: Not on  file    Forced sexual activity: Not on file  Other Topics Concern  . Not on file  Social History Narrative  . Not on file    ROS General: Denies fever, chills, night sweats, changes in weight, changes in appetite HEENT: Denies headaches, ear pain, changes in vision, rhinorrhea, sore throat CV: Denies CP, palpitations, SOB, orthopnea Pulm: Denies SOB   + cough, wheezing GI: Denies abdominal pain, nausea, vomiting, diarrhea, constipation GU: Denies dysuria, hematuria, frequency, vaginal discharge Msk: Denies muscle cramps, joint pains Neuro: Denies weakness, numbness, tingling Skin: Denies rashes, bruising Psych: Denies depression,  anxiety, hallucinations  BP 112/68 (BP Location: Left Arm, Patient Position: Sitting, Cuff Size: Normal)   Pulse 88   Temp 97.8 F (36.6 C) (Oral)   Ht 5\' 6"  (1.676 m)   Wt 170 lb (77.1 kg)   SpO2 95%   BMI 27.44 kg/m   Physical Exam Gen. Pleasant, well developed, well-nourished, in NAD HEENT - [place]/AT, PERRL, no scleral icterus, no nasal drainage, Large nasal septum perforation, pharynx without erythema or exudate. Neck: No JVD, no thyromegaly, no carotid bruits Lungs: no use of accessory muscles, scattered wheezes throughout.  Wheezes in b/l bases s/p neb.  no rales or rhonchi Cardiovascular: RRR, No r/g/m, no peripheral edema Abdomen: BS present, soft, nontender,nondistended Neuro:  A&Ox3, CN II-XII intact, normal gait Skin:  Warm, dry, intact, no lesions   Recent Results (from the past 2160 hour(s))  Basic metabolic panel     Status: None   Collection Time: 09/22/18  3:54 PM  Result Value Ref Range   Sodium 135 135 - 145 mmol/L   Potassium 3.9 3.5 - 5.1 mmol/L   Chloride 104 98 - 111 mmol/L   CO2 25 22 - 32 mmol/L   Glucose, Bld 99 70 - 99 mg/dL   BUN 18 6 - 20 mg/dL   Creatinine, Ser 0.98 0.61 - 1.24 mg/dL   Calcium 9.1 8.9 - 11.9 mg/dL   GFR calc non Af Amer >60 >60 mL/min   GFR calc Af Amer >60 >60 mL/min   Anion gap 6 5 - 15    Comment: Performed at [facility], [address], [place], [place] [address]  CBC with Differential     Status: None   Collection Time: 09/22/18  3:54 PM  Result Value Ref Range   WBC 8.9 4.0 - 10.5 K/uL   RBC 4.54 4.22 - 5.81 MIL/uL   Hemoglobin 13.2 13.0 - 17.0 g/dL   HCT 95.6 21.3 - 08.6 %   MCV 91.9 80.0 - 100.0 fL   MCH 29.1 26.0 - 34.0 pg   MCHC 31.7 30.0 - 36.0 g/dL   RDW 57.8 46.9 - 62.9 %   Platelets 331 150 - 400 K/uL   nRBC 0.0 0.0 - 0.2 %   Neutrophils Relative % 58 %   Neutro Abs 5.1 1.7 - 7.7 K/uL   Lymphocytes Relative 31 %   Lymphs Abs 2.7 0.7 - 4.0 K/uL   Monocytes Relative 8 %   Monocytes Absolute 0.7 0.1  - 1.0 K/uL   Eosinophils Relative 3 %   Eosinophils Absolute 0.3 0.0 - 0.5 K/uL   Basophils Relative 0 %   Basophils Absolute 0.0 0.0 - 0.1 K/uL   Immature Granulocytes 0 %   Abs Immature Granulocytes 0.02 0.00 - 0.07 K/uL    Comment: Performed at [facility], [address], [place], [place] [address]  I-stat troponin, ED  Status: None   Collection Time: 09/22/18  4:10 PM  Result Value Ref Range   Troponin i, poc 0.00 0.00 - 0.08 ng/mL   Comment 3            Comment: Due to the release kinetics of cTnI, a negative result within the first hours of the onset of symptoms does not rule out myocardial infarction with certainty. If myocardial infarction is still suspected, repeat the test at appropriate intervals.     Assessment/Plan: Wheezing  - Plan: ipratropium-albuterol (DUONEB) 0.5-2.5 (3) MG/3ML nebulizer solution 3 mL  Moderate persistent asthma with acute exacerbation  -Wheezing on exam.  DuoNeb given in clinic with improvement in breathing -continue Albuterol inhaler prn -consider humidifier at night -will start Advair -pt wishes to see Pulm.  Referral placed. - Plan: Fluticasone-Salmeterol (ADVAIR) 100-50 MCG/DOSE AEPB, Ambulatory referral to Pulmonology  Arthritis -discussed taking NSAIDs with food -Consider water aerobics  Gastroesophageal reflux disease, esophagitis presence not specified -Currently stable -Avoid foods known to cause symptoms -Pt wishes to continue ACV  Seasonal allergies -Continue OTC meds such as Claritin as needed  Encounter to establish care -We reviewed the PMH, PSH, FH, SH, Meds and Allergies. -We provided refills for any medications we will prescribe as needed. -We addressed current concerns per orders and patient instructions. -We have asked for records for pertinent exams, studies, vaccines and notes from previous providers. -We have advised patient to follow up per instructions below.  History of gout -currently  asymptomatic -encouraged to increase po intake of water and limit foods known to cause symptoms.  History of alcohol abuse -congratulated on sobriety -continue to monitor  Perforation of nasal septum  -stable  Former smoker -congratulated on efforts to remain tobacco free -continue to evaluate at each visit. -consider low dose CT for lung cancer screening given 30+yr pk hx  F/u prn in the next month  Abbe AmsterdamShannon Banks, MD

## 2018-10-11 NOTE — Patient Instructions (Signed)
Asthma, Adult  Asthma is a long-term (chronic) condition that causes recurrent episodes in which the airways become tight and narrow. The airways are the passages that lead from the nose and mouth down into the lungs. Asthma episodes, also called asthma attacks, can cause coughing, wheezing, shortness of breath, and chest pain. The airways can also fill with mucus. During an attack, it can be difficult to breathe. Asthma attacks can range from minor to life threatening. Asthma cannot be cured, but medicines and lifestyle changes can help control it and treat acute attacks. What are the causes? This condition is believed to be caused by inherited (genetic) and environmental factors, but its exact cause is not known. There are many things that can bring on an asthma attack or make asthma symptoms worse (triggers). Asthma triggers are different for each person. Common triggers include:  Mold.  Dust.  Cigarette smoke.  Cockroaches.  Things that can cause allergy symptoms (allergens), such as animal dander or pollen from trees or grass.  Air pollutants such as household cleaners, wood smoke, smog, or chemical odors.  Cold air, weather changes, and winds (which increase molds and pollen in the air).  Strong emotional expressions such as crying or laughing hard.  Stress.  Certain medicines (such as aspirin) or types of medicines (such as beta-blockers).  Sulfites in foods and drinks. Foods and drinks that may contain sulfites include dried fruit, potato chips, and sparkling grape juice.  Infections or inflammatory conditions such as the flu, a cold, or inflammation of the nasal membranes (rhinitis).  Gastroesophageal reflux disease (GERD).  Exercise or strenuous activity. What are the signs or symptoms? Symptoms of this condition may occur right after asthma is triggered or many hours later. Symptoms include:  Wheezing. This can sound like whistling when you breathe.  Excessive  nighttime or early morning coughing.  Frequent or severe coughing with a common cold.  Chest tightness.  Shortness of breath.  Tiredness (fatigue) with minimal activity. How is this diagnosed? This condition is diagnosed based on:  Your medical history.  A physical exam.  Tests, which may include: ? Lung function studies and pulmonary studies (spirometry). These tests can evaluate the flow of air in your lungs. ? Allergy tests. ? Imaging tests, such as X-rays. How is this treated? There is no cure for this condition, but treatment can help control your symptoms. Treatment for asthma usually involves:  Identifying and avoiding your asthma triggers.  Using medicines to control your symptoms. Generally, two types of medicines are used to treat asthma: ? Controller medicines. These help prevent asthma symptoms from occurring. They are usually taken every day. ? Fast-acting reliever or rescue medicines. These quickly relieve asthma symptoms by widening the narrow and tight airways. They are used as needed and provide short-term relief.  Using supplemental oxygen. This may be needed during a severe episode.  Using other medicines, such as: ? Allergy medicines, such as antihistamines, if your asthma attacks are triggered by allergens. ? Immune medicines (immunomodulators). These are medicines that help control the immune system.  Creating an asthma action plan. An asthma action plan is a written plan for managing and treating your asthma attacks. This plan includes: ? A list of your asthma triggers and how to avoid them. ? Information about when medicines should be taken and when their dosage should be changed. ? Instructions about using a device called a peak flow meter. A peak flow meter measures how well the lungs are working and the   severity of your asthma. It helps you monitor your condition. Follow these instructions at home: Controlling your home environment Control your home  environment in the following ways to help avoid triggers and prevent asthma attacks:  Change your heating and air conditioning filter regularly.  Limit your use of fireplaces and wood stoves.  Get rid of pests (such as roaches and mice) and their droppings.  Throw away plants if you see mold on them.  Clean floors and dust surfaces regularly. Use unscented cleaning products.  Try to have someone else vacuum for you regularly. Stay out of rooms while they are being vacuumed and for a short while afterward. If you vacuum, use a dust mask from a hardware store, a double-layered or microfilter vacuum cleaner bag, or a vacuum cleaner with a HEPA filter.  Replace carpet with wood, tile, or vinyl flooring. Carpet can trap dander and dust.  Use allergy-proof pillows, mattress covers, and box spring covers.  Keep your bedroom a trigger-free room.  Avoid pets and keep windows closed when allergens are in the air.  Wash beddings every week in hot water and dry them in a dryer.  Use blankets that are made of polyester or cotton.  Clean bathrooms and kitchens with bleach. If possible, have someone repaint the walls in these rooms with mold-resistant paint. Stay out of the rooms that are being cleaned and painted.  Wash your hands often with soap and water. If soap and water are not available, use hand sanitizer.  Do not allow anyone to smoke in your home. General instructions  Take over-the-counter and prescription medicines only as told by your health care provider. ? Speak with your health care provider if you have questions about how or when to take the medicines. ? Make note if you are requiring more frequent dosages.  Do not use any products that contain nicotine or tobacco, such as cigarettes and e-cigarettes. If you need help quitting, ask your health care provider. Also, avoid being exposed to secondhand smoke.  Use a peak flow meter as told by your health care provider. Record and  keep track of the readings.  Understand and use the asthma action plan to help minimize, or stop an asthma attack, without needing to seek medical care.  Make sure you stay up to date on your yearly vaccinations as told by your health care provider. This may include vaccines for the flu and pneumonia.  Avoid outdoor activities when allergen counts are high and when air quality is low.  Wear a ski mask that covers your nose and mouth during outdoor winter activities. Exercise indoors on cold days if you can.  Warm up before exercising, and take time for a cool-down period after exercise.  Keep all follow-up visits as told by your health care provider. This is important. Where to find more information  For information about asthma, turn to the Centers for Disease Control and Prevention at www.cdc.gov/asthma/faqs.htm  For air quality information, turn to AirNow at https://airnow.gov/ Contact a health care provider if:  You have wheezing, shortness of breath, or a cough even while you are taking medicine to prevent attacks.  The mucus you cough up (sputum) is thicker than usual.  Your sputum changes from clear or white to yellow, green, gray, or bloody.  Your medicines are causing side effects, such as a rash, itching, swelling, or trouble breathing.  You need to use a reliever medicine more than 2-3 times a week.  Your peak flow reading   is still at 50-79% of your personal best after following your action plan for 1 hour.  You have a fever. Get help right away if:  You are getting worse and do not respond to treatment during an asthma attack.  You are short of breath when at rest or when doing very little physical activity.  You have difficulty eating, drinking, or talking.  You have chest pain or tightness.  You develop a fast heartbeat or palpitations.  You have a bluish color to your lips or fingernails.  You are light-headed or dizzy, or you faint.  Your peak flow  reading is less than 50% of your personal best.  You feel too tired to breathe normally. Summary  Asthma is a long-term (chronic) condition that causes recurrent episodes in which the airways become tight and narrow. These episodes can cause coughing, wheezing, shortness of breath, and chest pain.  Asthma cannot be cured, but medicines and lifestyle changes can help control it and treat acute attacks.  Make sure you understand how to avoid triggers and how and when to use your medicines.  Asthma attacks can range from minor to life threatening. Get help right away if you have an asthma attack and do not respond to treatment with your usual rescue medicines. This information is not intended to replace advice given to you by your health care provider. Make sure you discuss any questions you have with your health care provider. Document Released: 10/10/2005 Document Revised: 11/14/2016 Document Reviewed: 11/14/2016 Elsevier Interactive Patient Education  2019 Elsevier Inc.  Postnasal Drip Postnasal drip is the feeling of mucus going down the back of your throat. Mucus is a slimy substance that moistens and cleans your nose and throat, as well as the air pockets in face bones near your forehead and cheeks (sinuses). Small amounts of mucus pass from your nose and sinuses down the back of your throat all the time. This is normal. When you produce too much mucus or the mucus gets too thick, you can feel it. Some common causes of postnasal drip include:  Having more mucus because of: ? A cold or the flu. ? Allergies. ? Cold air. ? Certain medicines.  Having more mucus that is thicker because of: ? A sinus or nasal infection. ? Dry air. ? A food allergy. Follow these instructions at home: Relieving discomfort   Gargle with a salt-water mixture 3-4 times a day or as needed. To make a salt-water mixture, completely dissolve -1 tsp of salt in 1 cup of warm water.  If the air in your home is  dry, use a humidifier to add moisture to the air.  Use a saline spray or container (neti pot) to flush out the nose (nasal irrigation). These methods can help clear away mucus and keep the nasal passages moist. General instructions  Take over-the-counter and prescription medicines only as told by your health care provider.  Follow instructions from your health care provider about eating or drinking restrictions. You may need to avoid caffeine.  Avoid things that you know you are allergic to (allergens), like dust, mold, pollen, pets, or certain foods.  Drink enough fluid to keep your urine pale yellow.  Keep all follow-up visits as told by your health care provider. This is important. Contact a health care provider if:  You have a fever.  You have a sore throat.  You have difficulty swallowing.  You have headache.  You have sinus pain.  You have a cough that  does not go away.  The mucus from your nose becomes thick and is green or yellow in color.  You have cold or flu symptoms that last more than 10 days. Summary  Postnasal drip is the feeling of mucus going down the back of your throat.  If your health care provider approves, use nasal irrigation or a nasal spray 2?4 times a day.  Avoid things that you know you are allergic to (allergens), like dust, mold, pollen, pets, or certain foods. This information is not intended to replace advice given to you by your health care provider. Make sure you discuss any questions you have with your health care provider. Document Released: 01/23/2017 Document Revised: 01/23/2017 Document Reviewed: 01/23/2017 Elsevier Interactive Patient Education  2019 [employer]

## 2018-10-12 ENCOUNTER — Encounter: Payer: Self-pay | Admitting: Family Medicine

## 2018-10-12 DIAGNOSIS — J3489 Other specified disorders of nose and nasal sinuses: Secondary | ICD-10-CM | POA: Insufficient documentation

## 2018-10-12 DIAGNOSIS — J309 Allergic rhinitis, unspecified: Secondary | ICD-10-CM | POA: Insufficient documentation

## 2018-10-12 DIAGNOSIS — J4541 Moderate persistent asthma with (acute) exacerbation: Secondary | ICD-10-CM | POA: Insufficient documentation

## 2018-10-12 DIAGNOSIS — K219 Gastro-esophageal reflux disease without esophagitis: Secondary | ICD-10-CM | POA: Insufficient documentation

## 2018-10-12 DIAGNOSIS — F1011 Alcohol abuse, in remission: Secondary | ICD-10-CM | POA: Insufficient documentation

## 2018-10-12 DIAGNOSIS — Z87891 Personal history of nicotine dependence: Secondary | ICD-10-CM | POA: Insufficient documentation

## 2018-10-12 DIAGNOSIS — J302 Other seasonal allergic rhinitis: Secondary | ICD-10-CM | POA: Insufficient documentation

## 2018-10-12 DIAGNOSIS — M1990 Unspecified osteoarthritis, unspecified site: Secondary | ICD-10-CM | POA: Insufficient documentation

## 2018-10-12 DIAGNOSIS — Z8739 Personal history of other diseases of the musculoskeletal system and connective tissue: Secondary | ICD-10-CM | POA: Insufficient documentation

## 2018-11-19 ENCOUNTER — Ambulatory Visit (INDEPENDENT_AMBULATORY_CARE_PROVIDER_SITE_OTHER)
Admission: RE | Admit: 2018-11-19 | Discharge: 2018-11-19 | Disposition: A | Discharge status: Home / Self Care | Payer: BLUE CROSS/BLUE SHIELD | Source: Ambulatory Visit | Attending: Pulmonary Disease | Admitting: Pulmonary Disease

## 2018-11-19 ENCOUNTER — Encounter: Payer: Self-pay | Admitting: Pulmonary Disease

## 2018-11-19 ENCOUNTER — Ambulatory Visit: Payer: BLUE CROSS/BLUE SHIELD | Admitting: Pulmonary Disease

## 2018-11-19 VITALS — BP 128/70 | HR 100 | Ht 65.5 in | Wt 171.0 lb

## 2018-11-19 DIAGNOSIS — R509 Fever, unspecified: Secondary | ICD-10-CM | POA: Diagnosis not present

## 2018-11-19 DIAGNOSIS — R0602 Shortness of breath: Secondary | ICD-10-CM | POA: Diagnosis not present

## 2018-11-19 DIAGNOSIS — J4540 Moderate persistent asthma, uncomplicated: Secondary | ICD-10-CM | POA: Diagnosis not present

## 2018-11-19 LAB — POCT INFLUENZA A/B
Influenza A, POC: NEGATIVE
Influenza B, POC: NEGATIVE

## 2018-11-19 LAB — NITRIC OXIDE: Nitric Oxide: 20

## 2018-11-19 MED ORDER — FLUTICASONE PROPIONATE 50 MCG/ACT NA SUSP: 2.0000 | Freq: Every day | NASAL | 2 refills | Status: DC

## 2018-11-19 MED ORDER — OMEPRAZOLE 20 MG PO CPDR: 20.0000 mg | DELAYED_RELEASE_CAPSULE | Freq: Every day | ORAL | 5 refills | Status: DC

## 2018-11-19 MED ORDER — PREDNISONE 10 MG PO TABS: ORAL_TABLET | ORAL | 0 refills | Status: DC

## 2018-11-19 NOTE — Progress Notes (Signed)
[name]    [id number]    02/01/1971  Primary Care Physician:Banks, Bettey MareShannon R, MD  Referring Physician: Deeann SaintBanks, Shannon R, MD [address] [place], [place] [id number]  Chief complaint: Consult for asthma with exacerbation  HPI: 48 year old with past medical history of asthma, ADHD. He has a history of childhood asthma.  Not on any regular inhalers.  Complains of bronchitis with increasing cough, congestion, sputum for the past 2 months.  He had been given several rounds of prednisone.  Started on [employer]ixela generic inhaler 2 months ago. States that he is somewhat better but still continues to have symptoms of congestion, dyspnea on exertion, cough with white mucus. 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  Previously worked in [employer] Exposures: possible asbestos exposure while he was working in [employer].  No mold, hot tub, Jacuzzi, humidifier Smoking history: 30-pack-year smoker.  Quit in 2017 Travel history: No significant travel Relevant family history: Family history of emphysema.  Outpatient Encounter Medications as of 11/19/2018  Medication Sig  . WIXELA INHUB 100-50 MCG/DOSE AEPB 1 puff 2 (two) times daily.  Marland Kitchen. albuterol (PROVENTIL HFA;VENTOLIN HFA) 108 (90 BASE) MCG/ACT inhaler Inhale 2 puffs into the lungs every 4 (four) hours as needed for wheezing or shortness of breath. (Patient not taking: Reported on 11/19/2018)  . [DISCONTINUED] Fluticasone-Salmeterol (ADVAIR) 100-50 MCG/DOSE AEPB Inhale 1 puff into the lungs 2 (two) times daily. (Patient not taking: Reported on 11/19/2018)   No facility-administered encounter medications on file as of 11/19/2018.     Allergies as of 11/19/2018  . (No Known Allergies)    Past Medical History:  Diagnosis Date  . ADHD (attention deficit hyperactivity disorder)   . Asthma     Past Surgical History:    Procedure Laterality Date  . cervical fracture repair      No family history on file.  Social History   Socioeconomic History  . Marital status: Legally Separated    Spouse name: Not on file  . Number of children: Not on file  . Years of education: Not on file  . Highest education level: Not on file  Occupational History  . Not on file  Social Needs  . Financial resource strain: Not on file  . Food insecurity:    Worry: Not on file    Inability: Not on file  . Transportation needs:    Medical: Not on file    Non-medical: Not on file  Tobacco Use  . Smoking status: Former Smoker    Packs/day: 1.00    Years: 37.00    Pack years: 37.00    Types: Cigarettes    Last attempt to quit: 2017    Years since quitting: 3.0  . Smokeless tobacco: Never Used  Substance and Sexual Activity  . Alcohol use: No  . Drug use: No  . Sexual activity: Not on file  Lifestyle  . Physical activity:    Days per week: Not on file    Minutes per session: Not on file  . Stress: Not on file  Relationships  . Social connections:    Talks on phone: Not on file    Gets together: Not on file    Attends religious service: Not on file    Active member of club or organization: Not on file    Attends meetings of clubs or organizations: Not  on file    Relationship status: Not on file  . Intimate partner violence:    Fear of current or ex partner: Not on file    Emotionally abused: Not on file    Physically abused: Not on file    Forced sexual activity: Not on file  Other Topics Concern  . Not on file  Social History Narrative  . Not on file    Review of systems: Review of Systems  Constitutional: Negative for fever and chills.  HENT: Negative.   Eyes: Negative for blurred vision.  Respiratory: as per HPI  Cardiovascular: Negative for chest pain and palpitations.  Gastrointestinal: Negative for vomiting, diarrhea, blood per rectum. Genitourinary: Negative for dysuria, urgency, frequency  and hematuria.  Musculoskeletal: Negative for myalgias, back pain and joint pain.  Skin: Negative for itching and rash.  Neurological: Negative for dizziness, tremors, focal weakness, seizures and loss of consciousness.  Endo/Heme/Allergies: Negative for environmental allergies.  Psychiatric/Behavioral: Negative for depression, suicidal ideas and hallucinations.  All other systems reviewed and are negative.  Physical Exam: Blood pressure 128/70, pulse 100, height 5' 5.5" (1.664 m), weight 171 lb (77.6 kg), SpO2 96 %. Gen:      No acute distress HEENT:  EOMI, sclera anicteric Neck:     No masses; no thyromegaly Lungs:    Bilateral expiratory wheeze, crackles. CV:         Regular rate and rhythm; no murmurs Abd:      + bowel sounds; soft, non-tender; no palpable masses, no distension Ext:    No edema; adequate peripheral perfusion Skin:      Warm and dry; no rash Neuro: alert and oriented x 3 Psych: normal mood and affect  Data Reviewed: Imaging: Chest x-ray 09/22/2018- clear lungs with no airspace consolidation or effusion.  No active cardiopulmonary disease.  I have reviewed the images personally  PFTs: Pending  Labs: CBC 09/22/2018-WBC 8.9, eos 3%, absolute eosinophil count 267  Assessment:  Asthma Get CBC differential, IgE level and PFTs We will need evaluation for COPD as well given significant smoking history Continue Wixela inhaler Continues to be symptomatic we will give him a prednisone taper starting at 40 mg.  Reduce dose by 10 mg every 3 days  Postnasal drip, GERD Try chlorpheniramine 8 mg 3 times daily and Flonase Prilosec 20 mg once daily for GERD.  Plan/Recommendations: - CBC, IgE, PFTs - Continue inhalers - Chlorphentermine, Flonase - Prilosec  Chilton Greathouse MD [facility] 11/19/2018, 4:30 PM  CC: Deeann Saint, MD

## 2018-11-19 NOTE — Patient Instructions (Signed)
We will get a chest x-ray today We will check CBC differential, IgE level Prednisone taper starting at 40 mg.  Reduce dose by 10 mg every 3 days Continue the wixela inhaler  For postnasal drip try chlorpheniramine 8 mg 3 times daily.  This medication is available over-the-counter We will also start you on Flonase and Prilosec 20 mg daily for acid reflux.  Follow-up in 2 to 4 weeks with PFTs.

## 2018-11-20 LAB — CBC WITH DIFFERENTIAL/PLATELET
Basophils Absolute: 0 10*3/uL (ref 0.0–0.1)
Basophils Relative: 0.3 % (ref 0.0–3.0)
Eosinophils Absolute: 0.2 10*3/uL (ref 0.0–0.7)
Eosinophils Relative: 1.8 % (ref 0.0–5.0)
HCT: 43.1 % (ref 39.0–52.0)
Hemoglobin: 14.2 g/dL (ref 13.0–17.0)
Lymphocytes Relative: 30.6 % (ref 12.0–46.0)
Lymphs Abs: 3.7 10*3/uL (ref 0.7–4.0)
MCHC: 33 g/dL (ref 30.0–36.0)
MCV: 92.4 fl (ref 78.0–100.0)
Monocytes Absolute: 1.2 10*3/uL — ABNORMAL HIGH (ref 0.1–1.0)
Monocytes Relative: 9.6 % (ref 3.0–12.0)
Neutro Abs: 7 10*3/uL (ref 1.4–7.7)
Neutrophils Relative %: 57.7 % (ref 43.0–77.0)
Platelets: 380 10*3/uL (ref 150.0–400.0)
RBC: 4.66 Mil/uL (ref 4.22–5.81)
RDW: 14.5 % (ref 11.5–15.5)
WBC: 12.2 10*3/uL — ABNORMAL HIGH (ref 4.0–10.5)

## 2018-11-20 LAB — IGE: IgE (Immunoglobulin E), Serum: 56 kU/L (ref <=114)

## 2018-11-30 ENCOUNTER — Other Ambulatory Visit: Payer: Self-pay | Admitting: Family Medicine

## 2018-11-30 DIAGNOSIS — J4541 Moderate persistent asthma with (acute) exacerbation: Secondary | ICD-10-CM

## 2018-12-05 ENCOUNTER — Ambulatory Visit: Payer: BLUE CROSS/BLUE SHIELD | Admitting: Pulmonary Disease

## 2018-12-05 ENCOUNTER — Telehealth: Payer: Self-pay | Admitting: Pulmonary Disease

## 2018-12-05 ENCOUNTER — Ambulatory Visit (INDEPENDENT_AMBULATORY_CARE_PROVIDER_SITE_OTHER): Payer: BLUE CROSS/BLUE SHIELD | Admitting: Pulmonary Disease

## 2018-12-05 ENCOUNTER — Encounter: Payer: Self-pay | Admitting: Pulmonary Disease

## 2018-12-05 VITALS — BP 140/72 | HR 91 | Ht 65.5 in | Wt 171.8 lb

## 2018-12-05 DIAGNOSIS — R0602 Shortness of breath: Secondary | ICD-10-CM

## 2018-12-05 DIAGNOSIS — J4540 Moderate persistent asthma, uncomplicated: Secondary | ICD-10-CM

## 2018-12-05 DIAGNOSIS — J321 Chronic frontal sinusitis: Secondary | ICD-10-CM

## 2018-12-05 LAB — NITRIC OXIDE: FeNO level (ppb): 32

## 2018-12-05 LAB — PULMONARY FUNCTION TEST
DL/VA % pred: 83 %
DL/VA: 3.8 ml/min/mmHg/L
DLCO cor % pred: 82 %
DLCO cor: 21.25 ml/min/mmHg
DLCO unc % pred: 82 %
DLCO unc: 21.01 ml/min/mmHg
FEF 25-75 Post: 0.99 L/sec
FEF 25-75 Pre: 0.69 L/sec
FEF2575-%Change-Post: 42 %
FEF2575-%Pred-Post: 31 %
FEF2575-%Pred-Pre: 22 %
FEV1-%Change-Post: 13 %
FEV1-%Pred-Post: 52 %
FEV1-%Pred-Pre: 46 %
FEV1-Post: 1.77 L
FEV1-Pre: 1.57 L
FEV1FVC-%Change-Post: 1 %
FEV1FVC-%Pred-Pre: 64 %
FEV6-%Change-Post: 11 %
FEV6-%Pred-Post: 80 %
FEV6-%Pred-Pre: 71 %
FEV6-Post: 3.37 L
FEV6-Pre: 3.01 L
FEV6FVC-%Change-Post: 0 %
FEV6FVC-%Pred-Post: 101 %
FEV6FVC-%Pred-Pre: 100 %
FVC-%Change-Post: 10 %
FVC-%Pred-Post: 78 %
FVC-%Pred-Pre: 71 %
FVC-Post: 3.43 L
FVC-Pre: 3.1 L
Post FEV1/FVC ratio: 52 %
Post FEV6/FVC ratio: 98 %
Pre FEV1/FVC ratio: 51 %
Pre FEV6/FVC Ratio: 97 %
RV % pred: 206 %
RV: 3.64 L
TLC % pred: 116 %
TLC: 7.04 L

## 2018-12-05 MED ORDER — TIOTROPIUM BROMIDE MONOHYDRATE 2.5 MCG/ACT IN AERS: 2.0000 | INHALATION_SPRAY | Freq: Every day | RESPIRATORY_TRACT | 2 refills | Status: DC

## 2018-12-05 MED ORDER — FLUTICASONE-SALMETEROL 500-50 MCG/DOSE IN AEPB: 1.0000 | INHALATION_SPRAY | Freq: Two times a day (BID) | RESPIRATORY_TRACT | 2 refills | Status: DC

## 2018-12-05 NOTE — Progress Notes (Signed)
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 ,CMA  

## 2018-12-05 NOTE — Telephone Encounter (Signed)
Called patient, he stated that his Spiriva is getting to costly for him. He is requesting something else to be called in that is cheaper. Advised patient to contact insurance company and ask for his formulary so that we would know what is cheapest and would work best. Patient stated that he was told by our office that we have something that is an alternative to Spiriva he can have. Patient stated that he would like to try that first before contacting his insurance company. Patient stated that he would also like to have a rescue inhaler to be sent to his pharmacy. Dr. Isaiah Serge please advise, thank you.   Instructions   We will increase the wixela inahler to 500/50 Add Spiriva 2.5 Start using chlorpheniramine 8 mg 3 times daily over-the-counter, continue Flonase We will refer you to ENT for evaluation of chronic sinusitis  Follow-up in 4 weeks.

## 2018-12-05 NOTE — Progress Notes (Addendum)
Wofford Billock Birman    [id number]    Jan 17, 1971  Primary Care Physician:Banks, Bettey Mare, MD  Referring Physician: Deeann Saint, MD [address] [place], [place] [address]  Chief complaint: Follow-up for COPD, asthma  HPI: 48 year old with past medical history of asthma, ADHD. He has a history of childhood asthma.  Not on any regular inhalers.  Complains of bronchitis with increasing cough, congestion, sputum for the past 2 months.  He had been given several rounds of prednisone.  Started on [employer] generic inhaler 2 months ago. States that he is somewhat better but still continues to have symptoms of congestion, dyspnea on exertion, cough with white mucus. 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  No mold, hot tub, Jacuzzi, humidifier Smoking history: 30-pack-year smoker.  Quit in 2017 Travel history: No significant travel Relevant family history: Family history of emphysema. ACQ score 12/05/2018- 2.86  Interim history: Continues to have significant symptoms of sinus congestion, postnasal drip, chest congestion with wheezing Does not feel prednisone has helped any  Outpatient Encounter Medications as of 12/05/2018  Medication Sig  . albuterol (PROVENTIL HFA;VENTOLIN HFA) 108 (90 BASE) MCG/ACT inhaler Inhale 2 puffs into the lungs every 4 (four) hours as needed for wheezing or shortness of breath.  . fluticasone (FLONASE) 50 MCG/ACT nasal spray Place 2 sprays into both nostrils daily.  Marland Kitchen omeprazole (PRILOSEC) 20 MG capsule Take 1 capsule (20 mg total) by mouth daily.  Monte Fantasia INHUB 100-50 MCG/DOSE AEPB TAKE 1 PUFF BY MOUTH TWICE A DAY  . [DISCONTINUED] predniSONE (DELTASONE) 10 MG tablet 4 tabs x 3 days, 3 tabs x 3 days, 2 tabs x 3 days, 1 tab x 3 days  then stop   No facility-administered encounter medications on file as of 12/05/2018.    Physical Exam: Blood pressure 140/72, pulse 91, height 5' 5.5" (1.664 m), weight 171 lb 12.8 oz (77.9 kg), SpO2 97 %. Gen:      No acute distress HEENT:  EOMI, sclera anicteric Neck:     No masses; no thyromegaly Lungs:    Bilateral expiratory wheeze CV:         Regular rate and rhythm; no murmurs Abd:      + bowel sounds; soft, non-tender; no palpable masses, no distension Ext:    No edema; adequate peripheral perfusion Skin:      Warm and dry; no rash Neuro: alert and oriented x 3 Psych: normal mood and affect  Data Reviewed: Imaging: Chest x-ray 09/22/2018- clear lungs with no airspace consolidation or effusion.  No active cardiopulmonary disease.  I have reviewed the images personally  PFTs: 12/05/2018 FVC 3.43 [78%), FEV1 1.77 [52%], F/F 52, TLC 116, DLCO 82% Severe obstruction with air trapping and bronchodilator response  FENO 12/05/2018-32  Labs: CBC 09/22/2018-WBC 8.9, eos 3%, absolute eosinophil count 267 CBC 11/19/2018-WBC 12.2, eos 1.8%, absolute eosinophil count 220 IgE 11/19/2018- 56  Assessment:  Asthma, COPD Continue Wixela inhaler.  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  Plan/Recommendations: - Wixela 500, Spiriva 2.5 -  ENT referral - Chlorphentermine, Flonase - Prilosec  Chilton Greathouse MD [facility] 12/05/2018, 1:44 PM  CC: Deeann Saint, MD

## 2018-12-05 NOTE — Patient Instructions (Signed)
We will increase the wixela inahler to 500/50 Add Spiriva 2.5 Start using chlorpheniramine 8 mg 3 times daily over-the-counter, continue Flonase We will refer you to ENT for evaluation of chronic sinusitis  Follow-up in 4 weeks.

## 2018-12-07 NOTE — Telephone Encounter (Signed)
I dont think there are cheaper alternative to spiriva. Can he check with his insurance for a list of covered meds?

## 2018-12-07 NOTE — Telephone Encounter (Signed)
Called and spoke to pt and relayed below message.  Pt states he will contact insurance company for a medication formulary. I have advised pt to contact our office once he has obtained formulary.  Pt states he has picked Spiriva Rx up. Pt is also requesting refill on Ventolin. Rx was not originally ordered by Dr. Isaiah Serge, nor is it mentioned to continue it in last OV note.  Dr. Isaiah Serge please advise if okay to refill Ventolin. Thanks.

## 2018-12-10 NOTE — Telephone Encounter (Signed)
Message routed to Dr. Mannam 

## 2018-12-11 MED ORDER — ALBUTEROL SULFATE HFA 108 (90 BASE) MCG/ACT IN AERS: 2.0000 | INHALATION_SPRAY | RESPIRATORY_TRACT | 6 refills | Status: DC | PRN

## 2018-12-11 NOTE — Telephone Encounter (Signed)
Ok to refill ventolin  

## 2018-12-11 NOTE — Telephone Encounter (Signed)
Called and spoke with Patient.  Per Dr Isaiah Serge, ok for Ventolin refill.  Patient requested [facility].  Ventolin prescription sent to requested pharmacy.  Nothing further at this time.

## 2019-01-02 ENCOUNTER — Ambulatory Visit: Payer: BLUE CROSS/BLUE SHIELD | Admitting: Pulmonary Disease

## 2019-01-26 ENCOUNTER — Other Ambulatory Visit: Payer: Self-pay | Admitting: Pulmonary Disease

## 2019-02-27 ENCOUNTER — Other Ambulatory Visit: Payer: Self-pay | Admitting: Pulmonary Disease

## 2019-03-01 DIAGNOSIS — J0101 Acute recurrent maxillary sinusitis: Secondary | ICD-10-CM | POA: Diagnosis not present

## 2019-03-01 DIAGNOSIS — J310 Chronic rhinitis: Secondary | ICD-10-CM | POA: Diagnosis not present

## 2019-05-17 ENCOUNTER — Other Ambulatory Visit: Payer: Self-pay | Admitting: Pulmonary Disease

## 2019-05-29 ENCOUNTER — Other Ambulatory Visit: Payer: Self-pay | Admitting: Pulmonary Disease

## 2019-07-08 ENCOUNTER — Other Ambulatory Visit: Payer: Self-pay

## 2019-07-08 MED ORDER — OMEPRAZOLE 20 MG PO CPDR: 20.0000 mg | DELAYED_RELEASE_CAPSULE | Freq: Every day | ORAL | 0 refills | Status: DC

## 2019-09-06 ENCOUNTER — Other Ambulatory Visit: Payer: Self-pay | Admitting: Pulmonary Disease

## 2019-09-07 ENCOUNTER — Other Ambulatory Visit: Payer: Self-pay | Admitting: Pulmonary Disease

## 2019-10-26 ENCOUNTER — Other Ambulatory Visit: Payer: Self-pay | Admitting: Pulmonary Disease

## 2019-11-13 DIAGNOSIS — M7541 Impingement syndrome of right shoulder: Secondary | ICD-10-CM | POA: Diagnosis not present

## 2019-11-13 DIAGNOSIS — M25511 Pain in right shoulder: Secondary | ICD-10-CM | POA: Diagnosis not present

## 2019-11-13 DIAGNOSIS — M542 Cervicalgia: Secondary | ICD-10-CM | POA: Diagnosis not present

## 2019-11-27 DIAGNOSIS — M542 Cervicalgia: Secondary | ICD-10-CM | POA: Diagnosis not present

## 2019-11-27 DIAGNOSIS — M25511 Pain in right shoulder: Secondary | ICD-10-CM | POA: Diagnosis not present

## 2019-12-04 ENCOUNTER — Other Ambulatory Visit: Payer: Self-pay | Admitting: Pulmonary Disease

## 2019-12-05 DIAGNOSIS — M25511 Pain in right shoulder: Secondary | ICD-10-CM | POA: Diagnosis not present

## 2019-12-12 DIAGNOSIS — M7501 Adhesive capsulitis of right shoulder: Secondary | ICD-10-CM | POA: Diagnosis not present

## 2019-12-12 DIAGNOSIS — M7541 Impingement syndrome of right shoulder: Secondary | ICD-10-CM | POA: Diagnosis not present

## 2019-12-12 DIAGNOSIS — M25511 Pain in right shoulder: Secondary | ICD-10-CM | POA: Diagnosis not present

## 2019-12-12 DIAGNOSIS — M75120 Complete rotator cuff tear or rupture of unspecified shoulder, not specified as traumatic: Secondary | ICD-10-CM | POA: Diagnosis not present

## 2020-01-21 ENCOUNTER — Other Ambulatory Visit: Payer: Self-pay

## 2020-01-21 ENCOUNTER — Encounter: Payer: Self-pay | Admitting: Primary Care

## 2020-01-21 ENCOUNTER — Ambulatory Visit: Payer: BLUE CROSS/BLUE SHIELD | Admitting: Primary Care

## 2020-01-21 ENCOUNTER — Ambulatory Visit (INDEPENDENT_AMBULATORY_CARE_PROVIDER_SITE_OTHER): Payer: BC Managed Care – PPO

## 2020-01-21 ENCOUNTER — Other Ambulatory Visit: Payer: Self-pay | Admitting: Pulmonary Disease

## 2020-01-21 VITALS — BP 110/80 | HR 85 | Temp 98.0°F | Ht 65.5 in | Wt 157.0 lb

## 2020-01-21 DIAGNOSIS — J441 Chronic obstructive pulmonary disease with (acute) exacerbation: Secondary | ICD-10-CM

## 2020-01-21 DIAGNOSIS — J449 Chronic obstructive pulmonary disease, unspecified: Secondary | ICD-10-CM | POA: Insufficient documentation

## 2020-01-21 DIAGNOSIS — J4540 Moderate persistent asthma, uncomplicated: Secondary | ICD-10-CM

## 2020-01-21 MED ORDER — ALBUTEROL SULFATE HFA 108 (90 BASE) MCG/ACT IN AERS: 2.0000 | INHALATION_SPRAY | RESPIRATORY_TRACT | 2 refills | Status: DC | PRN

## 2020-01-21 MED ORDER — AZITHROMYCIN 250 MG PO TABS: ORAL_TABLET | ORAL | 0 refills | Status: DC

## 2020-01-21 NOTE — Patient Instructions (Addendum)
Recommendations: Stop ONEOK Trelegy- take 1 puff every morning (rinse mouth after use) Take mucinex 600mg  twice a day  Rx: Zpack as prescribed  Follow-up: 7 to 10 days for preop clearance

## 2020-01-21 NOTE — Progress Notes (Signed)
@Patient  ID: [name], male    DOB: [birth year], 49 y.o.   MRN: [id number]  Chief Complaint  Patient presents with  . Follow-up    Surgical clearance/ Moderate persistent asthma     Referring provider: Billie Ruddy, MD  HPI: 49 year old male, former smoker.  Past medical history significant for COPD/asthma.  Patient of Dr. Vaughan Browner, last seen on 12/05/18. Maintained on Wixela 100.   01/21/2020 Patient presents today for an acute visit.  Accompanied by his girlfriend by phone call.  Patient was seen yesterday by Dr. Tonita Cong for rotator cuff surgery and was found to have abnormal lung sounds on exam.  Patient's surgery was delayed and asked to follow-up with pulmonary office.  Patient states that he has been in normal health but that his breathing has been fair overall.  He reports getting winded easily with activities such as showering and hiking.  He experiences occasional chest tightness, wheezing and cough.  His cough is productive with a purulent yellow mucus.  12/05/18 PFTs- FVC 3.43 (78%), FEV1 1.77 (52%), ratio 52, +BD  No Known Allergies  Immunization History  Administered Date(s) Administered  . Influenza,inj,Quad PF,6+ Mos 07/05/2017, 09/01/2018    Past Medical History:  Diagnosis Date  . ADHD (attention deficit hyperactivity disorder)   . Asthma     Tobacco History: Social History   Tobacco Use  Smoking Status Former Smoker  . Packs/day: 1.00  . Years: 37.00  . Pack years: 37.00  . Types: Cigarettes  . Quit date: 2017  . Years since quitting: 4.2  Smokeless Tobacco Never Used   Counseling given: Not Answered   Outpatient Medications Prior to Visit  Medication Sig Dispense Refill  . cyclobenzaprine (FLEXERIL) 10 MG tablet Take 10 mg by mouth 3 (three) times daily as needed.    . mupirocin ointment (BACTROBAN) 2 % APPLY TO ANTERIOR NOSTRILS TWICE A DAY X 7 DAYS PRE OP    . WIXELA INHUB 100-50 MCG/DOSE AEPB TAKE 1 PUFF BY MOUTH TWICE A DAY 60 each 3   . albuterol (PROVENTIL HFA;VENTOLIN HFA) 108 (90 Base) MCG/ACT inhaler Inhale 2 puffs into the lungs every 4 (four) hours as needed for wheezing or shortness of breath. 1 Inhaler 6  . albuterol (VENTOLIN HFA) 108 (90 Base) MCG/ACT inhaler INHALE 2 PUFFS INTO THE LUNGS EVERY 4 (FOUR) HOURS AS NEEDED FOR WHEEZING OR SHORTNESS OF BREATH. 8.5 g 2  . fluticasone (FLONASE) 50 MCG/ACT nasal spray Place 2 sprays into both nostrils daily. 16 g 2  . omeprazole (PRILOSEC) 20 MG capsule Take 1 capsule (20 mg total) by mouth daily. 30 capsule 0  . SPIRIVA RESPIMAT 2.5 MCG/ACT AERS INHALE 2 PUFFS BY MOUTH INTO THE LUNGS DAILY 4 g 2  . WIXELA INHUB 500-50 MCG/DOSE AEPB INHALE 1 PUFF BY MOUTH TWICE A DAY 180 each 0   No facility-administered medications prior to visit.   Review of Systems  Review of Systems  Respiratory: Positive for cough, shortness of breath and wheezing.     Physical Exam  BP 110/80 (BP Location: Right Arm, Patient Position: Sitting, Cuff Size: Normal)   Pulse 85   Temp 98 F (36.7 C) (Temporal)   Ht 5' 5.5" (1.664 m)   Wt 157 lb (71.2 kg)   SpO2 95% Comment: room air  BMI 25.73 kg/m  Physical Exam Constitutional:      Appearance: Normal appearance.  HENT:     Head: Normocephalic and atraumatic.     Mouth/Throat:  Mouth: Mucous membranes are moist.     Pharynx: Oropharynx is clear.  Cardiovascular:     Rate and Rhythm: Normal rate and regular rhythm.  Pulmonary:     Breath sounds: Rhonchi present.     Comments: Faint rhonchi left base Neurological:     General: No focal deficit present.     Mental Status: He is alert and oriented to person, place, and time. Mental status is at baseline.  Psychiatric:        Mood and Affect: Mood normal.        Behavior: Behavior normal.        Thought Content: Thought content normal.        Judgment: Judgment normal.      Lab Results:  CBC    Component Value Date/Time   WBC 12.2 (H) 11/19/2018 1713   RBC 4.66  11/19/2018 1713   HGB 14.2 11/19/2018 1713   HCT 43.1 11/19/2018 1713   PLT 380.0 11/19/2018 1713   MCV 92.4 11/19/2018 1713   MCH 29.1 09/22/2018 1554   MCHC 33.0 11/19/2018 1713   RDW 14.5 11/19/2018 1713   LYMPHSABS 3.7 11/19/2018 1713   MONOABS 1.2 (H) 11/19/2018 1713   EOSABS 0.2 11/19/2018 1713   BASOSABS 0.0 11/19/2018 1713    BMET    Component Value Date/Time   NA 135 09/22/2018 1554   K 3.9 09/22/2018 1554   CL 104 09/22/2018 1554   CO2 25 09/22/2018 1554   GLUCOSE 99 09/22/2018 1554   BUN 18 09/22/2018 1554   CREATININE 0.93 09/22/2018 1554   CALCIUM 9.1 09/22/2018 1554   GFRNONAA >60 09/22/2018 1554   GFRAA >60 09/22/2018 1554    BNP No results found for: BNP  ProBNP No results found for: PROBNP  Imaging: No results found.   Assessment & Plan:   COPD mixed type (HCC) - Acute exacerbation COPD/bronchitis vs chronic disease - Pulmonary function testing showed severe obstructive airway disease with bronchodilator response  - Change Wixela to Trelegy 100 one puff once daily  - Advised mucinex 600mg  twice a day - RX zpack as prescribed - Checking CXR  - FY in 7-10 days to pre-op clearance   9-10, NP 01/21/2020

## 2020-01-21 NOTE — Assessment & Plan Note (Addendum)
-   Acute exacerbation COPD/bronchitis vs chronic disease - Pulmonary function testing showed severe obstructive airway disease with bronchodilator response  - Change Wixela to Trelegy 100 one puff once daily  - Advised mucinex 600mg  twice a day - RX zpack as prescribed - Checking CXR  - FY in 7-10 days to pre-op clearance

## 2020-01-22 NOTE — Progress Notes (Signed)
Please let patient know CXR showed no acute cardiopulmonary process. Mild emphysema.

## 2020-01-31 ENCOUNTER — Encounter: Payer: Self-pay | Admitting: Primary Care

## 2020-01-31 ENCOUNTER — Other Ambulatory Visit: Payer: Self-pay

## 2020-01-31 ENCOUNTER — Ambulatory Visit (INDEPENDENT_AMBULATORY_CARE_PROVIDER_SITE_OTHER): Payer: BC Managed Care – PPO | Admitting: Primary Care

## 2020-01-31 DIAGNOSIS — J449 Chronic obstructive pulmonary disease, unspecified: Secondary | ICD-10-CM | POA: Diagnosis not present

## 2020-01-31 DIAGNOSIS — Z01811 Encounter for preprocedural respiratory examination: Secondary | ICD-10-CM | POA: Insufficient documentation

## 2020-01-31 DIAGNOSIS — Z0000 Encounter for general adult medical examination without abnormal findings: Secondary | ICD-10-CM | POA: Insufficient documentation

## 2020-01-31 MED ORDER — TRELEGY ELLIPTA 100-62.5-25 MCG/INH IN AEPB: 1.0000 | INHALATION_SPRAY | Freq: Once | RESPIRATORY_TRACT | 3 refills | Status: AC

## 2020-01-31 NOTE — Progress Notes (Signed)
@Patient  ID: [name], male    DOB: [birth year], 49 y.o.   MRN: [id number]  Chief Complaint  Patient presents with  . Follow-up    productive cough, clear to yellow phlegm, feels improvement with Trelegy    Referring provider: [id number], MD  HPI: 49 year old male, former smoker.  Past medical history significant for COPD/asthma.  Patient of Dr. 44, last seen on 12/05/18. Maintained on Wixela 100.   Previous [facility] pulmonary encounter: 01/21/2020 Patient presents today for an acute visit.  Accompanied by his girlfriend by phone call.  Patient was seen yesterday by Dr. 01/23/2020 for rotator cuff surgery and was found to have abnormal lung sounds on exam.  Patient's surgery was delayed and asked to follow-up with pulmonary office.  Patient states that he has been in normal health but that his breathing has been fair overall.  He reports getting winded easily with activities such as showering and hiking.  He experiences occasional chest tightness, wheezing and cough.  His cough is productive with a purulent yellow mucus.  01/31/2020 Patient presents today for 10 days follow-up COPD exacerbation and pre-op clearance. He was started on Trelegy during last visit and given Zpack. He is doing much better. Reports significant improvement in his breathing. He went hiking over the weekend and felt like this breathing was much less labored, His cough is also better, still productive but clearing. He ex[eroemces rare wheezing. No chest tightness. No shortness of breath. Planning for right shoulder surgery with Dr. 04/01/2020 surgical center, date to be determined after today's appointment. Clearance form will be faxed to their office.   PFTs 12/05/18- FVC 3.43 (78%), FEV1 1.77 (52%), ratio 52, +BD   No Known Allergies  Immunization History  Administered Date(s) Administered  . Influenza,inj,Quad PF,6+ Mos 07/05/2017, 09/01/2018  . PFIZER SARS-COV-2 Vaccination 12/21/2019, 01/11/2020    Past  Medical History:  Diagnosis Date  . ADHD (attention deficit hyperactivity disorder)   . Asthma     Tobacco History: Social History   Tobacco Use  Smoking Status Former Smoker  . Packs/day: 1.00  . Years: 37.00  . Pack years: 37.00  . Types: Cigarettes  . Quit date: 2017  . Years since quitting: 4.2  Smokeless Tobacco Never Used   Counseling given: Not Answered   Outpatient Medications Prior to Visit  Medication Sig Dispense Refill  . albuterol (VENTOLIN HFA) 108 (90 Base) MCG/ACT inhaler Inhale 2 puffs into the lungs every 4 (four) hours as needed for wheezing or shortness of breath. 8.5 g 2  . cyclobenzaprine (FLEXERIL) 10 MG tablet Take 10 mg by mouth 3 (three) times daily as needed.    . mupirocin ointment (BACTROBAN) 2 % APPLY TO ANTERIOR NOSTRILS TWICE A DAY X 7 DAYS PRE OP    . Fluticasone-Umeclidin-Vilant (TRELEGY ELLIPTA) 100-62.5-25 MCG/INH AEPB Inhale 1 puff into the lungs daily.    11-22-1985 azithromycin (ZITHROMAX) 250 MG tablet Zpack taper as directed 6 tablet 0  . WIXELA INHUB 100-50 MCG/DOSE AEPB TAKE 1 PUFF BY MOUTH TWICE A DAY (Patient not taking: Reported on 01/31/2020) 60 each 3   No facility-administered medications prior to visit.   Review of Systems  Review of Systems  Constitutional: Negative.   Respiratory: Positive for cough. Negative for chest tightness, shortness of breath and wheezing.   Cardiovascular: Negative.    Physical Exam  BP 128/68 (BP Location: Left Arm, Cuff Size: Normal)   Pulse (!) 104   Temp (!) 97.1 F (36.2 C) (  Temporal)   Ht 5' 6.5" (1.689 m)   Wt 162 lb 3.2 oz (73.6 kg)   SpO2 97%   BMI 25.79 kg/m  Physical Exam Constitutional:      Appearance: Normal appearance.  HENT:     Head: Normocephalic and atraumatic.     Mouth/Throat:     Mouth: Mucous membranes are moist.     Pharynx: Oropharynx is clear.     Comments: Mallampati class I Cardiovascular:     Rate and Rhythm: Normal rate and regular rhythm.  Pulmonary:      Effort: Pulmonary effort is normal.     Breath sounds: Normal breath sounds. No wheezing.  Musculoskeletal:        General: Normal range of motion.     Cervical back: Normal range of motion and neck supple.     Comments: Decreased ROM right shoulder   Neurological:     General: No focal deficit present.     Mental Status: He is alert and oriented to person, place, and time. Mental status is at baseline.  Psychiatric:        Mood and Affect: Mood normal.        Behavior: Behavior normal.        Thought Content: Thought content normal.        Judgment: Judgment normal.      Lab Results:  CBC    Component Value Date/Time   WBC 12.2 (H) 11/19/2018 1713   RBC 4.66 11/19/2018 1713   HGB 14.2 11/19/2018 1713   HCT 43.1 11/19/2018 1713   PLT 380.0 11/19/2018 1713   MCV 92.4 11/19/2018 1713   MCH 29.1 09/22/2018 1554   MCHC 33.0 11/19/2018 1713   RDW 14.5 11/19/2018 1713   LYMPHSABS 3.7 11/19/2018 1713   MONOABS 1.2 (H) 11/19/2018 1713   EOSABS 0.2 11/19/2018 1713   BASOSABS 0.0 11/19/2018 1713    BMET    Component Value Date/Time   NA 135 09/22/2018 1554   K 3.9 09/22/2018 1554   CL 104 09/22/2018 1554   CO2 25 09/22/2018 1554   GLUCOSE 99 09/22/2018 1554   BUN 18 09/22/2018 1554   CREATININE 0.93 09/22/2018 1554   CALCIUM 9.1 09/22/2018 1554   GFRNONAA >60 09/22/2018 1554   GFRAA >60 09/22/2018 1554    BNP No results found for: BNP  ProBNP No results found for: PROBNP  Imaging: DG Chest 2 View  Result Date: 01/21/2020 CLINICAL DATA:  Bronchitis, COPD, asthma EXAM: CHEST - 2 VIEW COMPARISON:  11/19/2018 FINDINGS: Frontal and lateral views of the chest demonstrate a stable cardiac silhouette. Mild background emphysema without airspace disease, effusion, or pneumothorax. No acute bony abnormalities. IMPRESSION: 1. Stable exam, no acute process. Electronically Signed   By: Randa Ngo M.D.   On: 01/21/2020 21:04     Assessment & Plan:   COPD mixed type  [facility]) - Former smoker. Mixed COPD/asthma (FEV1 52%) - Significant improvement on Trelegy 100 - patient could use Trelegy 200 in the future if needed  - Continue Trelegy 100 one puff daily (rinse mouth after use) - Continue Ventolin 2 puffs every 4 hours for break through shortness of breath and wheezing  Pre-operative respiratory examination - Patient cleared for right shoulder surgery with Dr. Tonita Cong from pulmonary standpoint.  - Treated for COPD exacerbation on 3/30 with Azithromycin, mucinex and change maintenance inhaler to triple therapy. CXR showed no active cardiopulmonary disease.  - Recommend Aggressive pulmonary toilet with O2, bronchodilatation, and incentive spirometry  and early ambulat  Recommend 1. Short duration of surgery as much as possible and avoid paralytic if possible 2. Recovery in step down or ICU with Pulmonary consultation if neccessary  3. DVT prophylaxis 4. Aggressive pulmonary toilet with o2, bronchodilatation, and incentive spirometry and early ambulation   Glenford Bayley, NP 01/31/2020

## 2020-01-31 NOTE — Assessment & Plan Note (Signed)
-   Former smoker. Mixed COPD/asthma (FEV1 52%) - Significant improvement on Trelegy 100 - patient could use Trelegy 200 in the future if needed  - Continue Trelegy 100 one puff daily (rinse mouth after use) - Continue Ventolin 2 puffs every 4 hours for break through shortness of breath and wheezing

## 2020-01-31 NOTE — Assessment & Plan Note (Signed)
-   Patient cleared for right shoulder surgery with Dr. Shelle Iron from pulmonary standpoint.  - Treated for COPD exacerbation on 3/30 with Azithromycin, mucinex and change maintenance inhaler to triple therapy. CXR showed no active cardiopulmonary disease.  - Recommend Aggressive pulmonary toilet with O2, bronchodilatation, and incentive spirometry and early ambulat

## 2020-01-31 NOTE — Patient Instructions (Addendum)
Pleasure seeing you again [name]  I am so glad that you are feeling better From pulmonary standpoint you are cleared for shoulder surgery with [name] office  Recommendations: - Continue Trelegy 1 puff daily (rinse mouth after use) - Use Ventolin 2 puffs every 4 hours for break through shortness of breath and wheezing  - Post-op Aggressive pulmonary toilet with O2, bronchodilatation, and incentive spirometry and early ambulation  Follow-up: - 6 months follow-up with [name] or Randall Sandy NP

## 2020-02-12 ENCOUNTER — Encounter (HOSPITAL_COMMUNITY): Payer: Self-pay | Admitting: Specialist

## 2020-02-12 ENCOUNTER — Inpatient Hospital Stay (HOSPITAL_COMMUNITY): Admission: RE | Admit: 2020-02-12 | Payer: BC Managed Care – PPO | Source: Ambulatory Visit

## 2020-02-12 ENCOUNTER — Other Ambulatory Visit: Payer: Self-pay

## 2020-02-12 ENCOUNTER — Ambulatory Visit: Payer: Self-pay | Admitting: Orthopedic Surgery

## 2020-02-12 NOTE — Progress Notes (Signed)
DUE TO COVID-19 ONLY ONE VISITOR IS ALLOWED TO COME WITH YOU AND STAY IN THE WAITING ROOM ONLY DURING PRE OP AND PROCEDURE DAY OF SURGERY. THE 1 VISITOR MAY VISIT WITH YOU AFTER SURGERY IN YOUR PRIVATE ROOM DURING VISITING HOURS ONLY!  YOU NEED TO HAVE A COVID 19 TEST ON__4/22/21 _____ @__0930am_____ , THIS TEST MUST BE DONE BEFORE SURGERY, COME  [address], [place] , .  [facility]) ONCE YOUR COVID TEST IS COMPLETED, PLEASE BEGIN THE QUARANTINE INSTRUCTIONS AS OUTLINED IN YOUR HANDOUT.                [name]  02/12/2020   Your procedure is scheduled on:  02/14/20   Report to [facility] Main  Entrance   Report to admitting at   1100 AM     Call this number if you have problems the morning of surgery [phone]    Remember: Do not eat food   :After Midnight. BRUSH YOUR TEETH MORNING OF SURGERY AND RINSE YOUR MOUTH OUT, NO CHEWING GUM CANDY OR MINTS.     Take these medicines the morning of surgery with A SIP OF WATER:  Inhalers as usual and bring                                   You may not have any metal on your body including hair pins and              piercings  Do not wear jewelry, make-up, lotions, powders or perfumes, deodorant                        Men may shave face and neck.   Do not bring valuables to the hospital. [facility] IS NOT             RESPONSIBLE   FOR VALUABLES.  Contacts, dentures or bridgework may not be worn into surgery.  Leave suitcase in the car. After surgery it may be brought to your room.     Patients discharged the day of surgery will not be allowed to drive home. IF YOU ARE HAVING SURGERY AND GOING HOME THE SAME DAY, YOU MUST HAVE AN ADULT TO DRIVE YOU HOME AND BE WITH YOU FOR 24 HOURS. YOU MAY GO HOME BY TAXI OR UBER OR ORTHERWISE, BUT AN ADULT MUST ACCOMPANY YOU HOME AND STAY WITH YOU FOR 24 HOURS.  Name and phone number of your driver:Becky 10-03-1998 - girlfriend                 Please  read over the following fact sheets you were given: _____________________________________________________________________             NO SOLID FOOD AFTER MIDNIGHT THE NIGHT PRIOR TO SURGERY. NOTHING BY MOUTH EXCEPT CLEAR LIQUIDS UNTIL1000am . PLEASE FINISH ENSURE DRINK PER SURGEON ORDER  WHICH NEEDS TO BE COMPLETED AT 1000am .   CLEAR LIQUID DIET   Foods Allowed                                                                     Foods Excluded  Coffee  and tea, regular and decaf                             liquids that you cannot  Plain Jell-O any favor except red or purple                                           see through such as: Fruit ices (not with fruit pulp)                                     milk, soups, orange juice  Iced Popsicles                                    All solid food Carbonated beverages, regular and diet                                    Cranberry, grape and apple juices Sports drinks like Gatorade Lightly seasoned clear broth or consume(fat free) Sugar, honey syrup  Sample Menu Breakfast                                Lunch                                     Supper Cranberry juice                    Beef broth                            Chicken broth Jell-O                                     Grape juice                           Apple juice Coffee or tea                        Jell-O                                      Popsicle                                                Coffee or tea                        Coffee or tea  _____________________________________________________________________  [facility] - Preparing for Surgery Before surgery, you can play an important role.  Because skin is not sterile, your skin needs to be as  free of germs as possible.  You can reduce the number of germs on your skin by washing with CHG (chlorahexidine gluconate) soap before surgery.  CHG is an antiseptic cleaner which kills germs and bonds with the skin to  continue killing germs even after washing. Please DO NOT use if you have an allergy to CHG or antibacterial soaps.  If your skin becomes reddened/irritated stop using the CHG and inform your nurse when you arrive at Short Stay. Do not shave (including legs and underarms) for at least 48 hours prior to the first CHG shower.  You may shave your face/neck. Please follow these instructions carefully:  1.  Shower with CHG Soap the night before surgery and the  morning of Surgery.  2.  If you choose to wash your hair, wash your hair first as usual with your  normal  shampoo.  3.  After you shampoo, rinse your hair and body thoroughly to remove the  shampoo.                           4.  Use CHG as you would any other liquid soap.  You can apply chg directly  to the skin and wash                       Gently with a scrungie or clean washcloth.  5.  Apply the CHG Soap to your body ONLY FROM THE NECK DOWN.   Do not use on face/ open                           Wound or open sores. Avoid contact with eyes, ears mouth and genitals (private parts).                       Wash face,  Genitals (private parts) with your normal soap.             6.  Wash thoroughly, paying special attention to the area where your surgery  will be performed.  7.  Thoroughly rinse your body with warm water from the neck down.  8.  DO NOT shower/wash with your normal soap after using and rinsing off  the CHG Soap.                9.  Pat yourself dry with a clean towel.            10.  Wear clean pajamas.            11.  Place clean sheets on your bed the night of your first shower and do not  sleep with pets. Day of Surgery : Do not apply any lotions/deodorants the morning of surgery.  Please wear clean clothes to the hospital/surgery center.  FAILURE TO FOLLOW THESE INSTRUCTIONS MAY RESULT IN THE CANCELLATION OF YOUR SURGERY PATIENT SIGNATURE_________________________________  NURSE  SIGNATURE__________________________________  ________________________________________________________________________   [name]  An incentive spirometer is a tool that can help keep your lungs clear and active. This tool measures how well you are filling your lungs with each breath. Taking long deep breaths may help reverse or decrease the chance of developing breathing (pulmonary) problems (especially infection) following:  A long period of time when you are unable to move or be active. BEFORE THE PROCEDURE   If the spirometer includes an indicator to show your best effort,  your nurse or respiratory therapist will set it to a desired goal.  If possible, sit up straight or lean slightly forward. Try not to slouch.  Hold the incentive spirometer in an upright position. INSTRUCTIONS FOR USE  1. Sit on the edge of your bed if possible, or sit up as far as you can in bed or on a chair. 2. Hold the incentive spirometer in an upright position. 3. Breathe out normally. 4. Place the mouthpiece in your mouth and seal your lips tightly around it. 5. Breathe in slowly and as deeply as possible, raising the piston or the ball toward the top of the column. 6. Hold your breath for 3-5 seconds or for as long as possible. Allow the piston or ball to fall to the bottom of the column. 7. Remove the mouthpiece from your mouth and breathe out normally. 8. Rest for a few seconds and repeat Steps 1 through 7 at least 10 times every 1-2 hours when you are awake. Take your time and take a few normal breaths between deep breaths. 9. The spirometer may include an indicator to show your best effort. Use the indicator as a goal to work toward during each repetition. 10. After each set of 10 deep breaths, practice coughing to be sure your lungs are clear. If you have an incision (the cut made at the time of surgery), support your incision when coughing by placing a pillow or rolled up towels firmly  against it. Once you are able to get out of bed, walk around indoors and cough well. You may stop using the incentive spirometer when instructed by your caregiver.  RISKS AND COMPLICATIONS  Take your time so you do not get dizzy or light-headed.  If you are in pain, you may need to take or ask for pain medication before doing incentive spirometry. It is harder to take a deep breath if you are having pain. AFTER USE  Rest and breathe slowly and easily.  It can be helpful to keep track of a log of your progress. Your caregiver can provide you with a simple table to help with this. If you are using the spirometer at home, follow these instructions: [place] IF:   You are having difficultly using the spirometer.  You have trouble using the spirometer as often as instructed.  Your pain medication is not giving enough relief while using the spirometer.  You develop fever of 100.5 F (38.1 C) or higher. SEEK IMMEDIATE MEDICAL CARE IF:   You cough up bloody sputum that had not been present before.  You develop fever of 102 F (38.9 C) or greater.  You develop worsening pain at or near the incision site. MAKE SURE YOU:   Understand these instructions.  Will watch your condition.  Will get help right away if you are not doing well or get worse. Document Released: 02/20/2007 Document Revised: 01/02/2012 Document Reviewed: 04/23/2007 [facility] Patient Information 2014 [place], [place].   ________________________________________________________________________

## 2020-02-13 ENCOUNTER — Ambulatory Visit: Payer: Self-pay | Admitting: Orthopedic Surgery

## 2020-02-13 ENCOUNTER — Encounter (HOSPITAL_COMMUNITY)
Admission: RE | Admit: 2020-02-13 | Discharge: 2020-02-13 | Disposition: A | Discharge status: Home / Self Care | Payer: BC Managed Care – PPO | Source: Ambulatory Visit | Attending: Specialist | Admitting: Specialist

## 2020-02-13 ENCOUNTER — Other Ambulatory Visit (HOSPITAL_COMMUNITY)
Admission: RE | Admit: 2020-02-13 | Discharge: 2020-02-13 | Disposition: A | Discharge status: Home / Self Care | Payer: BC Managed Care – PPO | Source: Ambulatory Visit | Attending: Specialist | Admitting: Specialist

## 2020-02-13 DIAGNOSIS — Z01812 Encounter for preprocedural laboratory examination: Secondary | ICD-10-CM | POA: Insufficient documentation

## 2020-02-13 DIAGNOSIS — Z20822 Contact with and (suspected) exposure to covid-19: Secondary | ICD-10-CM | POA: Diagnosis not present

## 2020-02-13 LAB — CBC WITH DIFFERENTIAL/PLATELET
Abs Immature Granulocytes: 0.02 10*3/uL (ref 0.00–0.07)
Basophils Absolute: 0 10*3/uL (ref 0.0–0.1)
Basophils Relative: 0 %
Eosinophils Absolute: 0.3 10*3/uL (ref 0.0–0.5)
Eosinophils Relative: 4 %
HCT: 43.1 % (ref 39.0–52.0)
Hemoglobin: 14.2 g/dL (ref 13.0–17.0)
Immature Granulocytes: 0 %
Lymphocytes Relative: 32 %
Lymphs Abs: 2.5 10*3/uL (ref 0.7–4.0)
MCH: 31.3 pg (ref 26.0–34.0)
MCHC: 32.9 g/dL (ref 30.0–36.0)
MCV: 94.9 fL (ref 80.0–100.0)
Monocytes Absolute: 0.8 10*3/uL (ref 0.1–1.0)
Monocytes Relative: 10 %
Neutro Abs: 4.2 10*3/uL (ref 1.7–7.7)
Neutrophils Relative %: 54 %
Platelets: 350 10*3/uL (ref 150–400)
RBC: 4.54 MIL/uL (ref 4.22–5.81)
RDW: 14.6 % (ref 11.5–15.5)
WBC: 7.8 10*3/uL (ref 4.0–10.5)
nRBC: 0 % (ref 0.0–0.2)

## 2020-02-13 LAB — SARS CORONAVIRUS 2 (TAT 6-24 HRS): SARS Coronavirus 2: NEGATIVE

## 2020-02-13 LAB — BASIC METABOLIC PANEL
Anion gap: 7 (ref 5–15)
BUN: 22 mg/dL — ABNORMAL HIGH (ref 6–20)
CO2: 22 mmol/L (ref 22–32)
Calcium: 9.3 mg/dL (ref 8.9–10.3)
Chloride: 108 mmol/L (ref 98–111)
Creatinine, Ser: 0.73 mg/dL (ref 0.61–1.24)
GFR calc Af Amer: >60 mL/min (ref >60)
GFR calc non Af Amer: >60 mL/min (ref >60)
Glucose, Bld: 98 mg/dL (ref 70–99)
Potassium: 4.6 mmol/L (ref 3.5–5.1)
Sodium: 137 mmol/L (ref 135–145)

## 2020-02-13 MED ORDER — VANCOMYCIN HCL 10 G IV SOLR: 1000.0000 mg | INTRAVENOUS | Status: AC

## 2020-02-13 NOTE — H&P (Signed)
[name] is an 49 y.o. male.   Chief Complaint: R shoulder pain HPI: Visit For: Follow up (to discuss right shoulder MRI) Hand Dominance: right Location: right; shoulder Duration: 6 weeks Quality: aching Severity: pain level 1/10 Aggravating Factors: ROM; worse at night  Past Medical History:  Diagnosis Date  . ADHD (attention deficit hyperactivity disorder)   . Asthma   . COPD (chronic obstructive pulmonary disease) (HCC)   . GERD (gastroesophageal reflux disease)   . Heart murmur    as a child     Past Surgical History:  Procedure Laterality Date  . cervical fracture repair      No family history on file. Social History:  reports that he quit smoking about 4 years ago. His smoking use included cigarettes. He has a 37.00 pack-year smoking history. He has never used smokeless tobacco. He reports that he does not drink alcohol or use drugs.  Allergies: No Known Allergies  (Not in a hospital admission)   Results for orders placed or performed during the hospital encounter of 02/13/20 (from the past 48 hour(s))  CBC WITH DIFFERENTIAL     Status: None   Collection Time: 02/13/20  8:13 AM  Result Value Ref Range   WBC 7.8 4.0 - 10.5 K/uL   RBC 4.54 4.22 - 5.81 MIL/uL   Hemoglobin 14.2 13.0 - 17.0 g/dL   HCT 43.1 39.0 - 52.0 %   MCV 94.9 80.0 - 100.0 fL   MCH 31.3 26.0 - 34.0 pg   MCHC 32.9 30.0 - 36.0 g/dL   RDW 14.6 11.5 - 15.5 %   Platelets 350 150 - 400 K/uL   nRBC 0.0 0.0 - 0.2 %   Neutrophils Relative % 54 %   Neutro Abs 4.2 1.7 - 7.7 K/uL   Lymphocytes Relative 32 %   Lymphs Abs 2.5 0.7 - 4.0 K/uL   Monocytes Relative 10 %   Monocytes Absolute 0.8 0.1 - 1.0 K/uL   Eosinophils Relative 4 %   Eosinophils Absolute 0.3 0.0 - 0.5 K/uL   Basophils Relative 0 %   Basophils Absolute 0.0 0.0 - 0.1 K/uL   Immature Granulocytes 0 %   Abs Immature Granulocytes 0.02 0.00 - 0.07 K/uL    Comment: Performed at [facility], [address] [place], [place] [address]   No results found.  Review of Systems  Constitutional: Negative.   HENT: Negative.   Eyes: Negative.   Respiratory: Negative.   Cardiovascular: Negative.   Gastrointestinal: Negative.   Endocrine: Negative.   Genitourinary: Negative.   Musculoskeletal: Positive for arthralgias.  Neurological: Negative.   Hematological: Negative.     There were no vitals taken for this visit. Physical Exam  Constitutional: He is oriented to person, place, and time. He appears well-developed and well-nourished.  HENT:  Head: Normocephalic.  Eyes: Pupils are equal, round, and reactive to light. Conjunctivae are normal.  Cardiovascular: Normal rate.  Respiratory: Effort normal.  GI: Soft.  Musculoskeletal:     Cervical back: Normal range of motion and neck supple.     Comments: Constitutional: General Appearance: healthy-appearing and NAD.  Psychiatric: Mood and Affect: normal mood and affect.  Cardiovascular System: Arterial Pulses Right: radial normal and brachial normal. Varicosities Right: no varicosities.  C-Spine/Neck: Active Range of Motion: flexion normal, extension normal, and no pain elicited on motion.  Shoulders: Inspection Right: no misalignment, atrophy, erythema, swelling, or scapular winging. Bony Palpation Right: no tenderness of the sternoclavicular joint, the   coracoid process, the acromioclavicular joint, the bicipital groove, or the scapula. Soft Tissue Palpation Right: tenderness of the supraspinatus and the subacromial bursa. Active Range of Motion Right: limited. Special Tests Right: Speed's test negative and Neer's test positive. Stability Right: no laxity, sulcus sign negative, and anterior apprehension test negative. Strength Right: abduction 5/5, adduction 5/5, flexion 5/5, and extension 5/5.  Skin: Right Upper Extremity: normal.  Neurological System: Biceps Reflex Right: normal (2). Brachioradialis Reflex Right: normal (2). Triceps Reflex  Right: normal (2). Sensation on the Right: C5 normal, C6 normal, and C7 normal.  Neurological: He is alert and oriented to person, place, and time.    MRI of the right shoulder demonstrates marked had supraspinatus tendon thinning with a small full-thickness tear at the footprint. Partial tearing of the subscap. A labral tear. Mild AC arthrosis.  Assessment/Plan Impression:   Patient demonstrates right shoulder pain secondary to predominantly a full--thickness tear of the rotator cuff in the supraspinatus with thinning of the supraspinatus as well an underlying adhesive capsulitis.   Plan:   I discussed options with the patient. Given the has a full-thickness tear and symptomatic we discussed repair of the rotator cuff tendon. He does have a fairly thin tendon there may be some cleavage tear associated with this. An extensive discussion concerning the pathology relevant anatomy and treatment options. After that discussion we mutually agreed to proceed with repair of the rotator cuff utilizing arthroscopic assistance if possible. The risks and benefits of that procedure were discussed including bleeding, infection, suboptimal range of motion, deep venous thrombosis, pulmonary embolism, anesthetic complications etc. in addition we discussed the postoperative course to include approximately 4 weeks of passive range of motion followed by 4 weeks of active range of motion followed by 4-12 weeks of progressive strengthening exercises. In addition we discussed protective activities to reduce the risk of a reinjury including impingement activities with elbow above the shoulder as well as reaching and repetitive circular motion activities. The hospital stay will either be as a outpatient with a regional block versus overnight depending upon the extent of the procedure and any challenging health issues with a first postoperative visit 2 weeks following the surgery.  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  Would use vancomycin.  Preoperative decolonization.  Outpatient.  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  I would suggest 3 months of disability.  I discussed the possibility of inability to repair or required a patch graft.  Plan R shoulder scope, SAD, debridement, mini-open RCR  Dorothy Spark, PA-C for Dr. Shelle Iron 02/13/2020, 8:47 AM

## 2020-02-13 NOTE — H&P (View-Only) (Signed)
[name] is an 49 y.o. male.   Chief Complaint: R shoulder pain HPI: Visit For: Follow up (to discuss right shoulder MRI) Hand Dominance: right Location: right; shoulder Duration: 6 weeks Quality: aching Severity: pain level 1/10 Aggravating Factors: ROM; worse at night  Past Medical History:  Diagnosis Date  . ADHD (attention deficit hyperactivity disorder)   . Asthma   . COPD (chronic obstructive pulmonary disease) ([place])   . GERD (gastroesophageal reflux disease)   . Heart murmur    as a child     Past Surgical History:  Procedure Laterality Date  . cervical fracture repair      No family history on file. Social History:  reports that he quit smoking about 4 years ago. His smoking use included cigarettes. He has a 37.00 pack-year smoking history. He has never used smokeless tobacco. He reports that he does not drink alcohol or use drugs.  Allergies: No Known Allergies  (Not in a hospital admission)   Results for orders placed or performed during the hospital encounter of 02/13/20 (from the past 48 hour(s))  CBC WITH DIFFERENTIAL     Status: None   Collection Time: 02/13/20  8:13 AM  Result Value Ref Range   WBC 7.8 4.0 - 10.5 K/uL   RBC 4.54 4.22 - 5.81 MIL/uL   Hemoglobin 14.2 13.0 - 17.0 g/dL   HCT 43.1 39.0 - 52.0 %   MCV 94.9 80.0 - 100.0 fL   MCH 31.3 26.0 - 34.0 pg   MCHC 32.9 30.0 - 36.0 g/dL   RDW 14.6 11.5 - 15.5 %   Platelets 350 150 - 400 K/uL   nRBC 0.0 0.0 - 0.2 %   Neutrophils Relative % 54 %   Neutro Abs 4.2 1.7 - 7.7 K/uL   Lymphocytes Relative 32 %   Lymphs Abs 2.5 0.7 - 4.0 K/uL   Monocytes Relative 10 %   Monocytes Absolute 0.8 0.1 - 1.0 K/uL   Eosinophils Relative 4 %   Eosinophils Absolute 0.3 0.0 - 0.5 K/uL   Basophils Relative 0 %   Basophils Absolute 0.0 0.0 - 0.1 K/uL   Immature Granulocytes 0 %   Abs Immature Granulocytes 0.02 0.00 - 0.07 K/uL    Comment: Performed at [facility], [place] [address], [place], [place] [address]   No results found.  Review of Systems  Constitutional: Negative.   HENT: Negative.   Eyes: Negative.   Respiratory: Negative.   Cardiovascular: Negative.   Gastrointestinal: Negative.   Endocrine: Negative.   Genitourinary: Negative.   Musculoskeletal: Positive for arthralgias.  Neurological: Negative.   Hematological: Negative.     There were no vitals taken for this visit. Physical Exam  Constitutional: He is oriented to person, place, and time. He appears well-developed and well-nourished.  HENT:  Head: Normocephalic.  Eyes: Pupils are equal, round, and reactive to light. Conjunctivae are normal.  Cardiovascular: Normal rate.  Respiratory: Effort normal.  GI: Soft.  Musculoskeletal:     Cervical back: Normal range of motion and neck supple.     Comments: Constitutional: General Appearance: healthy-appearing and NAD.  Psychiatric: Mood and Affect: normal mood and affect.  Cardiovascular System: Arterial Pulses Right: radial normal and brachial normal. Varicosities Right: no varicosities.  C-Spine/Neck: Active Range of Motion: flexion normal, extension normal, and no pain elicited on motion.  Shoulders: Inspection Right: no misalignment, atrophy, erythema, swelling, or scapular winging. Bony Palpation Right: no tenderness of the sternoclavicular joint, the  coracoid process, the acromioclavicular joint, the bicipital groove, or the scapula. Soft Tissue Palpation Right: tenderness of the supraspinatus and the subacromial bursa. Active Range of Motion Right: limited. Special Tests Right: Speed's test negative and Neer's test positive. Stability Right: no laxity, sulcus sign negative, and anterior apprehension test negative. Strength Right: abduction 5/5, adduction 5/5, flexion 5/5, and extension 5/5.  Skin: Right Upper Extremity: normal.  Neurological System: Biceps Reflex Right: normal (2). Brachioradialis Reflex Right: normal (2). Triceps Reflex  Right: normal (2). Sensation on the Right: C5 normal, C6 normal, and C7 normal.  Neurological: He is alert and oriented to person, place, and time.    MRI of the right shoulder demonstrates marked had supraspinatus tendon thinning with a small full-thickness tear at the footprint. Partial tearing of the subscap. A labral tear. Mild AC arthrosis.  Assessment/Plan Impression:   Patient demonstrates right shoulder pain secondary to predominantly a full--thickness tear of the rotator cuff in the supraspinatus with thinning of the supraspinatus as well an underlying adhesive capsulitis.   Plan:   I discussed options with the patient. Given the has a full-thickness tear and symptomatic we discussed repair of the rotator cuff tendon. He does have a fairly thin tendon there may be some cleavage tear associated with this. An extensive discussion concerning the pathology relevant anatomy and treatment options. After that discussion we mutually agreed to proceed with repair of the rotator cuff utilizing arthroscopic assistance if possible. The risks and benefits of that procedure were discussed including bleeding, infection, suboptimal range of motion, deep venous thrombosis, pulmonary embolism, anesthetic complications etc. in addition we discussed the postoperative course to include approximately 4 weeks of passive range of motion followed by 4 weeks of active range of motion followed by 4-12 weeks of progressive strengthening exercises. In addition we discussed protective activities to reduce the risk of a reinjury including impingement activities with elbow above the shoulder as well as reaching and repetitive circular motion activities. The hospital stay will either be as a outpatient with a regional block versus overnight depending upon the extent of the procedure and any challenging health issues with a first postoperative visit 2 weeks following the surgery.  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  Would use vancomycin.  Preoperative decolonization.  Outpatient.  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  I would suggest 3 months of disability.  I discussed the possibility of inability to repair or required a patch graft.  Plan R shoulder scope, SAD, debridement, mini-open RCR  Dorothy Spark, PA-C for Dr. Shelle Iron 02/13/2020, 8:47 AM

## 2020-02-14 ENCOUNTER — Observation Stay (HOSPITAL_COMMUNITY)
Admission: RE | Admit: 2020-02-14 | Discharge: 2020-02-15 | Disposition: A | Discharge status: Home / Self Care | Payer: BC Managed Care – PPO | Attending: Specialist | Admitting: Specialist

## 2020-02-14 ENCOUNTER — Other Ambulatory Visit: Payer: Self-pay

## 2020-02-14 ENCOUNTER — Encounter (HOSPITAL_COMMUNITY)
Admission: RE | Disposition: A | Discharge status: Home / Self Care | Payer: Self-pay | Source: Home / Self Care | Attending: Specialist

## 2020-02-14 ENCOUNTER — Ambulatory Visit (HOSPITAL_COMMUNITY): Payer: BC Managed Care – PPO | Admitting: Anesthesiology

## 2020-02-14 ENCOUNTER — Encounter (HOSPITAL_COMMUNITY): Payer: Self-pay | Admitting: Specialist

## 2020-02-14 DIAGNOSIS — Z87891 Personal history of nicotine dependence: Secondary | ICD-10-CM | POA: Diagnosis not present

## 2020-02-14 DIAGNOSIS — M75111 Incomplete rotator cuff tear or rupture of right shoulder, not specified as traumatic: Secondary | ICD-10-CM | POA: Diagnosis not present

## 2020-02-14 DIAGNOSIS — M75121 Complete rotator cuff tear or rupture of right shoulder, not specified as traumatic: Secondary | ICD-10-CM | POA: Diagnosis not present

## 2020-02-14 DIAGNOSIS — J449 Chronic obstructive pulmonary disease, unspecified: Secondary | ICD-10-CM | POA: Insufficient documentation

## 2020-02-14 DIAGNOSIS — Z96611 Presence of right artificial shoulder joint: Secondary | ICD-10-CM | POA: Diagnosis present

## 2020-02-14 DIAGNOSIS — M75101 Unspecified rotator cuff tear or rupture of right shoulder, not specified as traumatic: Secondary | ICD-10-CM | POA: Diagnosis not present

## 2020-02-14 DIAGNOSIS — Z8614 Personal history of Methicillin resistant Staphylococcus aureus infection: Secondary | ICD-10-CM | POA: Diagnosis not present

## 2020-02-14 DIAGNOSIS — M7541 Impingement syndrome of right shoulder: Secondary | ICD-10-CM | POA: Diagnosis not present

## 2020-02-14 DIAGNOSIS — M75120 Complete rotator cuff tear or rupture of unspecified shoulder, not specified as traumatic: Secondary | ICD-10-CM | POA: Diagnosis present

## 2020-02-14 DIAGNOSIS — Z7951 Long term (current) use of inhaled steroids: Secondary | ICD-10-CM | POA: Insufficient documentation

## 2020-02-14 DIAGNOSIS — G8918 Other acute postprocedural pain: Secondary | ICD-10-CM | POA: Diagnosis not present

## 2020-02-14 HISTORY — PX: SHOULDER ARTHROSCOPY WITH ROTATOR CUFF REPAIR AND SUBACROMIAL DECOMPRESSION: SHX5686

## 2020-02-14 HISTORY — DX: Cardiac murmur, unspecified: R01.1

## 2020-02-14 HISTORY — DX: Chronic obstructive pulmonary disease, unspecified: J44.9

## 2020-02-14 HISTORY — DX: Gastro-esophageal reflux disease without esophagitis: K21.9

## 2020-02-14 SURGERY — SHOULDER ARTHROSCOPY WITH ROTATOR CUFF REPAIR AND SUBACROMIAL DECOMPRESSION
Anesthesia: General | Laterality: Right

## 2020-02-14 MED ORDER — MEPERIDINE HCL 50 MG/ML IJ SOLN: 6.2500 mg | INTRAMUSCULAR | Status: DC | PRN

## 2020-02-14 MED ORDER — MAGNESIUM CITRATE PO SOLN: 1.0000 | Freq: Once | ORAL | Status: DC | PRN

## 2020-02-14 MED ORDER — MIDAZOLAM HCL 2 MG/2ML IJ SOLN
1.0000 mg | INTRAMUSCULAR | Status: DC
  Administered 2020-02-14: 2 mg via INTRAVENOUS
  Filled 2020-02-14: qty 2

## 2020-02-14 MED ORDER — BUPIVACAINE-EPINEPHRINE 0.5% -1:200000 IJ SOLN
INTRAMUSCULAR | Status: AC
  Filled 2020-02-14: qty 1

## 2020-02-14 MED ORDER — VANCOMYCIN HCL IN DEXTROSE 1-5 GM/200ML-% IV SOLN
INTRAVENOUS | Status: AC
  Filled 2020-02-14: qty 200

## 2020-02-14 MED ORDER — OXYCODONE HCL 5 MG/5ML PO SOLN: 5.0000 mg | Freq: Once | ORAL | Status: DC | PRN

## 2020-02-14 MED ORDER — BISACODYL 5 MG PO TBEC: 5.0000 mg | DELAYED_RELEASE_TABLET | Freq: Every day | ORAL | Status: DC | PRN

## 2020-02-14 MED ORDER — SODIUM CHLORIDE 0.9 % IV SOLN
INTRAVENOUS | Status: AC
  Filled 2020-02-14: qty 500000

## 2020-02-14 MED ORDER — LACTATED RINGERS IV SOLN: INTRAVENOUS | Status: DC

## 2020-02-14 MED ORDER — PROPOFOL 10 MG/ML IV BOLUS
INTRAVENOUS | Status: DC | PRN
  Administered 2020-02-14: 50 mg via INTRAVENOUS
  Administered 2020-02-14: 150 mg via INTRAVENOUS

## 2020-02-14 MED ORDER — ALBUTEROL SULFATE HFA 108 (90 BASE) MCG/ACT IN AERS
INHALATION_SPRAY | RESPIRATORY_TRACT | Status: AC
  Filled 2020-02-14: qty 6.7

## 2020-02-14 MED ORDER — ALBUTEROL SULFATE (2.5 MG/3ML) 0.083% IN NEBU: 3.0000 mL | INHALATION_SOLUTION | RESPIRATORY_TRACT | Status: DC | PRN

## 2020-02-14 MED ORDER — CYCLOBENZAPRINE HCL 10 MG PO TABS: 10.0000 mg | ORAL_TABLET | Freq: Three times a day (TID) | ORAL | 1 refills | Status: DC | PRN

## 2020-02-14 MED ORDER — DOCUSATE SODIUM 100 MG PO CAPS
100.0000 mg | ORAL_CAPSULE | Freq: Two times a day (BID) | ORAL | 0 refills | Status: DC | PRN
Start: 2020-02-14 — End: 2020-08-03

## 2020-02-14 MED ORDER — DOCUSATE SODIUM 100 MG PO CAPS
100.0000 mg | ORAL_CAPSULE | Freq: Two times a day (BID) | ORAL | Status: DC
  Administered 2020-02-14: 100 mg via ORAL
  Filled 2020-02-14: qty 1

## 2020-02-14 MED ORDER — METOCLOPRAMIDE HCL 5 MG/ML IJ SOLN: 5.0000 mg | Freq: Three times a day (TID) | INTRAMUSCULAR | Status: DC | PRN

## 2020-02-14 MED ORDER — FENTANYL CITRATE (PF) 100 MCG/2ML IJ SOLN
INTRAMUSCULAR | Status: DC | PRN
  Administered 2020-02-14: 50 ug via INTRAVENOUS
  Administered 2020-02-14: 25 ug via INTRAVENOUS

## 2020-02-14 MED ORDER — ONDANSETRON HCL 4 MG/2ML IJ SOLN
INTRAMUSCULAR | Status: AC
  Filled 2020-02-14: qty 2

## 2020-02-14 MED ORDER — VANCOMYCIN HCL IN DEXTROSE 1-5 GM/200ML-% IV SOLN
INTRAVENOUS | Status: AC
  Administered 2020-02-14: 1000 mg
  Filled 2020-02-14: qty 200

## 2020-02-14 MED ORDER — FENTANYL CITRATE (PF) 100 MCG/2ML IJ SOLN
50.0000 ug | INTRAMUSCULAR | Status: DC
  Administered 2020-02-14: 100 ug via INTRAVENOUS
  Filled 2020-02-14: qty 2

## 2020-02-14 MED ORDER — DEXAMETHASONE SODIUM PHOSPHATE 10 MG/ML IJ SOLN
INTRAMUSCULAR | Status: DC | PRN
  Administered 2020-02-14: 8 mg via INTRAVENOUS

## 2020-02-14 MED ORDER — ONDANSETRON HCL 4 MG PO TABS: 4.0000 mg | ORAL_TABLET | Freq: Four times a day (QID) | ORAL | Status: DC | PRN

## 2020-02-14 MED ORDER — PHENYLEPHRINE HCL (PRESSORS) 10 MG/ML IV SOLN
INTRAVENOUS | Status: AC
  Filled 2020-02-14: qty 1

## 2020-02-14 MED ORDER — ACETAMINOPHEN 500 MG PO TABS
500.0000 mg | ORAL_TABLET | Freq: Four times a day (QID) | ORAL | Status: DC
  Administered 2020-02-14 – 2020-02-15 (×3): 500 mg via ORAL
  Filled 2020-02-14 (×3): qty 1

## 2020-02-14 MED ORDER — FLUTICASONE FUROATE-VILANTEROL 100-25 MCG/INH IN AEPB
1.0000 | INHALATION_SPRAY | Freq: Every day | RESPIRATORY_TRACT | Status: DC
  Filled 2020-02-14: qty 28

## 2020-02-14 MED ORDER — FENTANYL CITRATE (PF) 100 MCG/2ML IJ SOLN
INTRAMUSCULAR | Status: AC
  Filled 2020-02-14: qty 2

## 2020-02-14 MED ORDER — PHENYLEPHRINE 40 MCG/ML (10ML) SYRINGE FOR IV PUSH (FOR BLOOD PRESSURE SUPPORT)
PREFILLED_SYRINGE | INTRAVENOUS | Status: DC | PRN
  Administered 2020-02-14 (×2): 120 ug via INTRAVENOUS

## 2020-02-14 MED ORDER — ONDANSETRON HCL 4 MG/2ML IJ SOLN: 4.0000 mg | Freq: Four times a day (QID) | INTRAMUSCULAR | Status: DC | PRN

## 2020-02-14 MED ORDER — BUPIVACAINE-EPINEPHRINE (PF) 0.5% -1:200000 IJ SOLN
INTRAMUSCULAR | Status: DC | PRN
  Administered 2020-02-14: 15 mL via PERINEURAL

## 2020-02-14 MED ORDER — LIDOCAINE 2% (20 MG/ML) 5 ML SYRINGE
INTRAMUSCULAR | Status: AC
  Filled 2020-02-14: qty 5

## 2020-02-14 MED ORDER — MENTHOL 3 MG MT LOZG: 1.0000 | LOZENGE | OROMUCOSAL | Status: DC | PRN

## 2020-02-14 MED ORDER — SODIUM CHLORIDE 0.9 % IV SOLN
INTRAVENOUS | Status: DC | PRN
  Administered 2020-02-14: 14:00:00 500 mL

## 2020-02-14 MED ORDER — PHENOL 1.4 % MT LIQD: 1.0000 | OROMUCOSAL | Status: DC | PRN

## 2020-02-14 MED ORDER — DIPHENHYDRAMINE HCL 12.5 MG/5ML PO ELIX
12.5000 mg | ORAL_SOLUTION | ORAL | Status: DC | PRN
  Administered 2020-02-14: 25 mg via ORAL
  Filled 2020-02-14: qty 10

## 2020-02-14 MED ORDER — ACETAMINOPHEN 325 MG PO TABS: 325.0000 mg | ORAL_TABLET | Freq: Four times a day (QID) | ORAL | Status: DC | PRN

## 2020-02-14 MED ORDER — FLUTICASONE-UMECLIDIN-VILANT 100-62.5-25 MCG/INH IN AEPB: 1.0000 | INHALATION_SPRAY | Freq: Every day | RESPIRATORY_TRACT | Status: DC

## 2020-02-14 MED ORDER — ACETAMINOPHEN 160 MG/5ML PO SOLN: 325.0000 mg | ORAL | Status: DC | PRN

## 2020-02-14 MED ORDER — ALBUTEROL SULFATE HFA 108 (90 BASE) MCG/ACT IN AERS
INHALATION_SPRAY | RESPIRATORY_TRACT | Status: DC | PRN
  Administered 2020-02-14: 4 via RESPIRATORY_TRACT

## 2020-02-14 MED ORDER — ENOXAPARIN SODIUM 40 MG/0.4ML ~~LOC~~ SOLN
40.0000 mg | SUBCUTANEOUS | Status: AC
  Administered 2020-02-15: 40 mg via SUBCUTANEOUS
  Filled 2020-02-14: qty 0.4

## 2020-02-14 MED ORDER — ACETAMINOPHEN 325 MG PO TABS: 325.0000 mg | ORAL_TABLET | ORAL | Status: DC | PRN

## 2020-02-14 MED ORDER — VANCOMYCIN HCL IN DEXTROSE 1-5 GM/200ML-% IV SOLN
1000.0000 mg | Freq: Once | INTRAVENOUS | Status: AC
  Administered 2020-02-14: 1000 mg via INTRAVENOUS

## 2020-02-14 MED ORDER — METHOCARBAMOL 500 MG PO TABS
500.0000 mg | ORAL_TABLET | Freq: Four times a day (QID) | ORAL | Status: DC | PRN
  Administered 2020-02-14: 500 mg via ORAL
  Filled 2020-02-14: qty 1

## 2020-02-14 MED ORDER — PROPOFOL 10 MG/ML IV BOLUS
INTRAVENOUS | Status: AC
  Filled 2020-02-14: qty 20

## 2020-02-14 MED ORDER — POLYETHYLENE GLYCOL 3350 17 G PO PACK: 17.0000 g | PACK | Freq: Every day | ORAL | Status: DC | PRN

## 2020-02-14 MED ORDER — METHOCARBAMOL 500 MG IVPB - SIMPLE MED
500.0000 mg | Freq: Four times a day (QID) | INTRAVENOUS | Status: DC | PRN
  Filled 2020-02-14: qty 50

## 2020-02-14 MED ORDER — BUPIVACAINE LIPOSOME 1.3 % IJ SUSP
INTRAMUSCULAR | Status: DC | PRN
  Administered 2020-02-14: 10 mL via PERINEURAL

## 2020-02-14 MED ORDER — FENTANYL CITRATE (PF) 100 MCG/2ML IJ SOLN: 25.0000 ug | INTRAMUSCULAR | Status: DC | PRN

## 2020-02-14 MED ORDER — EPINEPHRINE PF 1 MG/ML IJ SOLN
INTRAMUSCULAR | Status: AC
  Filled 2020-02-14: qty 4

## 2020-02-14 MED ORDER — PHENYLEPHRINE HCL-NACL 10-0.9 MG/250ML-% IV SOLN
INTRAVENOUS | Status: DC | PRN
  Administered 2020-02-14: 25 ug/min via INTRAVENOUS

## 2020-02-14 MED ORDER — ROCURONIUM BROMIDE 10 MG/ML (PF) SYRINGE
PREFILLED_SYRINGE | INTRAVENOUS | Status: DC | PRN
  Administered 2020-02-14: 50 mg via INTRAVENOUS

## 2020-02-14 MED ORDER — KCL IN DEXTROSE-NACL 20-5-0.45 MEQ/L-%-% IV SOLN
INTRAVENOUS | Status: DC
  Filled 2020-02-14: qty 1000

## 2020-02-14 MED ORDER — OXYCODONE HCL 5 MG PO TABS: 5.0000 mg | ORAL_TABLET | Freq: Once | ORAL | Status: DC | PRN

## 2020-02-14 MED ORDER — OXYCODONE-ACETAMINOPHEN 5-325 MG PO TABS: 1.0000 | ORAL_TABLET | Freq: Four times a day (QID) | ORAL | 0 refills | Status: DC | PRN

## 2020-02-14 MED ORDER — HYDROCODONE-ACETAMINOPHEN 7.5-325 MG PO TABS: 1.0000 | ORAL_TABLET | ORAL | Status: DC | PRN

## 2020-02-14 MED ORDER — POVIDONE-IODINE 7.5 % EX SOLN: Freq: Once | CUTANEOUS | Status: DC

## 2020-02-14 MED ORDER — SODIUM CHLORIDE 0.9 % IR SOLN
Status: DC | PRN
  Administered 2020-02-14: 6000 mL

## 2020-02-14 MED ORDER — METOCLOPRAMIDE HCL 5 MG PO TABS: 5.0000 mg | ORAL_TABLET | Freq: Three times a day (TID) | ORAL | Status: DC | PRN

## 2020-02-14 MED ORDER — LIDOCAINE 2% (20 MG/ML) 5 ML SYRINGE
INTRAMUSCULAR | Status: DC | PRN
  Administered 2020-02-14: 40 mg via INTRAVENOUS

## 2020-02-14 MED ORDER — UMECLIDINIUM BROMIDE 62.5 MCG/INH IN AEPB
1.0000 | INHALATION_SPRAY | Freq: Every day | RESPIRATORY_TRACT | Status: DC
  Filled 2020-02-14: qty 7

## 2020-02-14 MED ORDER — EPINEPHRINE PF 1 MG/ML IJ SOLN
INTRAMUSCULAR | Status: DC | PRN
  Administered 2020-02-14: 2 mg

## 2020-02-14 MED ORDER — VANCOMYCIN HCL IN DEXTROSE 1-5 GM/200ML-% IV SOLN
1000.0000 mg | Freq: Once | INTRAVENOUS | Status: AC
  Administered 2020-02-15: 1000 mg via INTRAVENOUS
  Filled 2020-02-14: qty 200

## 2020-02-14 MED ORDER — MORPHINE SULFATE (PF) 2 MG/ML IV SOLN: 0.5000 mg | INTRAVENOUS | Status: DC | PRN

## 2020-02-14 MED ORDER — BUPIVACAINE-EPINEPHRINE 0.5% -1:200000 IJ SOLN
INTRAMUSCULAR | Status: DC | PRN
  Administered 2020-02-14: 20 mL

## 2020-02-14 MED ORDER — TRAMADOL HCL 50 MG PO TABS
50.0000 mg | ORAL_TABLET | Freq: Four times a day (QID) | ORAL | Status: DC
  Administered 2020-02-14 – 2020-02-15 (×3): 50 mg via ORAL
  Filled 2020-02-14 (×3): qty 1

## 2020-02-14 MED ORDER — ONDANSETRON HCL 4 MG/2ML IJ SOLN
INTRAMUSCULAR | Status: DC | PRN
  Administered 2020-02-14: 4 mg via INTRAVENOUS

## 2020-02-14 MED ORDER — ONDANSETRON HCL 4 MG/2ML IJ SOLN: 4.0000 mg | Freq: Once | INTRAMUSCULAR | Status: DC | PRN

## 2020-02-14 MED ORDER — DEXAMETHASONE SODIUM PHOSPHATE 10 MG/ML IJ SOLN
INTRAMUSCULAR | Status: AC
  Filled 2020-02-14: qty 1

## 2020-02-14 MED ORDER — ALUM & MAG HYDROXIDE-SIMETH 200-200-20 MG/5ML PO SUSP: 30.0000 mL | ORAL | Status: DC | PRN

## 2020-02-14 MED ORDER — SUGAMMADEX SODIUM 200 MG/2ML IV SOLN
INTRAVENOUS | Status: DC | PRN
  Administered 2020-02-14: 180 mg via INTRAVENOUS

## 2020-02-14 MED ORDER — HYDROCODONE-ACETAMINOPHEN 5-325 MG PO TABS
1.0000 | ORAL_TABLET | ORAL | Status: DC | PRN
  Administered 2020-02-14: 1 via ORAL
  Filled 2020-02-14: qty 1

## 2020-02-14 MED ORDER — ROCURONIUM BROMIDE 10 MG/ML (PF) SYRINGE
PREFILLED_SYRINGE | INTRAVENOUS | Status: AC
  Filled 2020-02-14: qty 10

## 2020-02-14 SURGICAL SUPPLY — 67 items
ANCHOR NDL 9/16 CIR SZ 8 (NEEDLE) IMPLANT
ANCHOR NEEDLE 9/16 CIR SZ 8 (NEEDLE) IMPLANT
ANCHOR PEEK 4.75X19.1 SWLK C (Anchor) ×10 IMPLANT
BLADE EXCALIBUR 4.0MM X 13CM (MISCELLANEOUS) ×1
BLADE EXCALIBUR 4.0X13 (MISCELLANEOUS) ×2 IMPLANT
BLADE SURG SZ11 CARB STEEL (BLADE) ×3 IMPLANT
CANNULA ACUFO 5X76 (CANNULA) ×3 IMPLANT
CLEANER TIP ELECTROSURG 2X2 (MISCELLANEOUS) IMPLANT
CLOSURE WOUND 1/2 X4 (GAUZE/BANDAGES/DRESSINGS) ×1
COVER SURGICAL LIGHT HANDLE (MISCELLANEOUS) ×3 IMPLANT
COVER WAND RF STERILE (DRAPES) IMPLANT
DISSECTOR  3.8MM X 13CM (MISCELLANEOUS)
DISSECTOR 3.5MM X 13CM (MISCELLANEOUS) IMPLANT
DISSECTOR 3.8MM X 13CM (MISCELLANEOUS) IMPLANT
DRAPE STERI 35X30 U-POUCH (DRAPES) ×3 IMPLANT
DRESSING AQUACEL AG SP 3.5X6 (GAUZE/BANDAGES/DRESSINGS) IMPLANT
DRSG AQUACEL AG ADV 3.5X 4 (GAUZE/BANDAGES/DRESSINGS) IMPLANT
DRSG AQUACEL AG ADV 3.5X 6 (GAUZE/BANDAGES/DRESSINGS) IMPLANT
DRSG AQUACEL AG SP 3.5X6 (GAUZE/BANDAGES/DRESSINGS) ×3
DRSG PAD ABDOMINAL 8X10 ST (GAUZE/BANDAGES/DRESSINGS) IMPLANT
DURAPREP 26ML APPLICATOR (WOUND CARE) ×3 IMPLANT
ELECT NDL TIP 2.8 STRL (NEEDLE) IMPLANT
ELECT NEEDLE TIP 2.8 STRL (NEEDLE) IMPLANT
ELECT REM PT RETURN 15FT ADLT (MISCELLANEOUS) ×3 IMPLANT
FILTER STRAW (MISCELLANEOUS) ×3 IMPLANT
GLOVE BIOGEL PI IND STRL 7.5 (GLOVE) ×1 IMPLANT
GLOVE BIOGEL PI IND STRL 8 (GLOVE) ×1 IMPLANT
GLOVE BIOGEL PI INDICATOR 7.5 (GLOVE) ×2
GLOVE BIOGEL PI INDICATOR 8 (GLOVE) ×2
GLOVE SURG SS PI 7.5 STRL IVOR (GLOVE) ×6 IMPLANT
GLOVE SURG SS PI 8.0 STRL IVOR (GLOVE) ×6 IMPLANT
GOWN STRL REUS W/TWL XL LVL3 (GOWN DISPOSABLE) ×6 IMPLANT
KIT BASIN (CUSTOM PROCEDURE TRAY) ×3 IMPLANT
KIT TURNOVER KIT A (KITS) IMPLANT
MANIFOLD NEPTUNE II (INSTRUMENTS) ×3 IMPLANT
NDL SCORPION MULTI FIRE (NEEDLE) IMPLANT
NDL SPNL 18GX3.5 QUINCKE PK (NEEDLE) ×1 IMPLANT
NEEDLE SCORPION MULTI FIRE (NEEDLE) ×3 IMPLANT
NEEDLE SPNL 18GX3.5 QUINCKE PK (NEEDLE) ×3 IMPLANT
PACK SHOULDER (CUSTOM PROCEDURE TRAY) ×3 IMPLANT
PENCIL SMOKE EVACUATOR (MISCELLANEOUS) IMPLANT
PROTECTOR NERVE ULNAR (MISCELLANEOUS) ×3 IMPLANT
RESTRAINT HEAD UNIVERSAL NS (MISCELLANEOUS) IMPLANT
SLING ARM IMMOBILIZER LRG (SOFTGOODS) IMPLANT
SLING ARM IMMOBILIZER MED (SOFTGOODS) IMPLANT
SLING ULTRA II L (ORTHOPEDIC SUPPLIES) IMPLANT
STRIP CLOSURE SKIN 1/2X4 (GAUZE/BANDAGES/DRESSINGS) ×1 IMPLANT
SUCTION FRAZIER HANDLE 12FR (TUBING) ×3
SUCTION TUBE FRAZIER 12FR DISP (TUBING) ×1 IMPLANT
SUT ETHIBOND NAB CT1 #1 30IN (SUTURE) IMPLANT
SUT ETHILON 4 0 PS 2 18 (SUTURE) ×2 IMPLANT
SUT FIBERWIRE #2 38 T-5 BLUE (SUTURE)
SUT PROLENE 3 0 PS 2 (SUTURE) IMPLANT
SUT TIGER TAPE 7 IN WHITE (SUTURE) IMPLANT
SUT VIC AB 0 CT1 27 (SUTURE) ×3
SUT VIC AB 0 CT1 27XBRD ANTBC (SUTURE) ×1 IMPLANT
SUT VIC AB 1-0 CT2 27 (SUTURE) IMPLANT
SUT VIC AB 2-0 CT2 27 (SUTURE) IMPLANT
SUT VICRYL 0 UR6 27IN ABS (SUTURE) ×2 IMPLANT
SUTURE FIBERWR #2 38 T-5 BLUE (SUTURE) IMPLANT
TAPE FIBER 2MM 7IN #2 BLUE (SUTURE) IMPLANT
TOWEL OR 17X26 10 PK STRL BLUE (TOWEL DISPOSABLE) ×3 IMPLANT
TUBING ARTHROSCOPY IRRIG 16FT (MISCELLANEOUS) ×3 IMPLANT
TUBING CONNECTING 10 (TUBING) IMPLANT
TUBING CONNECTING 10' (TUBING)
WAND HAND CNTRL MULTIVAC 90 (MISCELLANEOUS) ×2 IMPLANT
WIPE CHG CHLORHEXIDINE 2% (PERSONAL CARE ITEMS) ×3 IMPLANT

## 2020-02-14 NOTE — Progress Notes (Signed)
Carlena Hurl PA answered page.  Pt stable in PACU in 2 L;  J Bissell reports pt may be transferred to tele with pulsoximetry post-op.

## 2020-02-14 NOTE — Discharge Instructions (Signed)

## 2020-02-14 NOTE — Transfer of Care (Signed)
Immediate Anesthesia Transfer of Care Note  Patient: [name]  Procedure(s) Performed: SHOULDER ARTHROSCOPY WITH MINI OPEN ROTATOR CUFF REPAIR AND SUBACROMIAL DECOMPRESSION AND DEBRIDEMENT (Right )  Patient Location: PACU  Anesthesia Type:General  Level of Consciousness: awake, alert  and oriented  Airway & Oxygen Therapy: Patient Spontanous Breathing and Patient connected to face mask oxygen  Post-op Assessment: Report given to RN, Post -op Vital signs reviewed and stable and Patient moving all extremities X 4  Post vital signs: Reviewed and stable  Last Vitals:  Vitals Value Taken Time  BP 129/89 02/14/20 1520  Temp    Pulse 90 02/14/20 1523  Resp 15 02/14/20 1523  SpO2 100 % 02/14/20 1523  Vitals shown include unvalidated device data.  Last Pain:  Vitals:   02/14/20 1245  TempSrc:   PainSc: 0-No pain         Complications: No apparent anesthesia complications

## 2020-02-14 NOTE — Brief Op Note (Signed)
02/14/2020  12:54 PM  PATIENT:  [name]  49 y.o. male  PRE-OPERATIVE DIAGNOSIS:  Right shoulder impingement, rotator cuff repair  POST-OPERATIVE DIAGNOSIS:  * No post-op diagnosis entered *  PROCEDURE:  Procedure(s) with comments: SHOULDER ARTHROSCOPY WITH MINI OPEN ROTATOR CUFF REPAIR AND SUBACROMIAL DECOMPRESSION AND DEBRIDEMENT (Right) - 90 MINS BLOCK OK- NO EXPAREL  SURGEON:  Surgeon(s) and Role:    * Jene Every, MD - Primary  PHYSICIAN ASSISTANT:   ASSISTANTS: Bissell   ANESTHESIA:   general  EBL:  min   BLOOD ADMINISTERED:none  DRAINS: none   LOCAL MEDICATIONS USED:  MARCAINE     SPECIMEN:  No Specimen  DISPOSITION OF SPECIMEN:  none  COUNTS:  YES  TOURNIQUET:  * No tourniquets in log *  DICTATION: .Other Dictation: Dictation Number  [id number]  PLAN OF CARE: Admit for overnight observation  PATIENT DISPOSITION:  PACU - hemodynamically stable.   Delay start of Pharmacological VTE agent (>24hrs) due to surgical blood loss or risk of bleeding: no

## 2020-02-14 NOTE — Plan of Care (Signed)
  Problem: Education: Goal: Knowledge of General Education information will improve Description Including pain rating scale, medication(s)/side effects and non-pharmacologic comfort measures Outcome: Progressing   Problem: Education: Goal: Knowledge of General Education information will improve Description Including pain rating scale, medication(s)/side effects and non-pharmacologic comfort measures Outcome: Progressing   

## 2020-02-14 NOTE — Anesthesia Procedure Notes (Signed)
Procedure Name: Intubation Date/Time: 02/14/2020 1:29 PM Performed by: Niel Hummer, CRNA Pre-anesthesia Checklist: Patient identified, Emergency Drugs available, Suction available and Patient being monitored Patient Re-evaluated:Patient Re-evaluated prior to induction Oxygen Delivery Method: Circle system utilized Preoxygenation: Pre-oxygenation with 100% oxygen Induction Type: IV induction Ventilation: Mask ventilation without difficulty and Oral airway inserted - appropriate to patient size Laryngoscope Size: Mac and 4 Grade View: Grade I Tube type: Oral Tube size: 7.5 mm Number of attempts: 1 Airway Equipment and Method: Stylet Placement Confirmation: ETT inserted through vocal cords under direct vision,  positive ETCO2 and breath sounds checked- equal and bilateral Secured at: 22 cm Tube secured with: Tape Dental Injury: Teeth and Oropharynx as per pre-operative assessment

## 2020-02-14 NOTE — Progress Notes (Signed)
Assisted Dr. Oddono with right, ultrasound guided, interscalene  block. Side rails up, monitors on throughout procedure. See vital signs in flow sheet. Tolerated Procedure well. 

## 2020-02-14 NOTE — Op Note (Signed)
NAME: [name], Randall W. MEDICAL RECORD [id number] ACCOUNT [id number] DATE OF BIRTH:Aug 25, 1971 FACILITY: [facility] LOCATION: [id number] PHYSICIAN:Dvaughn Fickle Connye Burkitt, MD  OPERATIVE REPORT  DATE OF PROCEDURE:  02/14/2020  PREOPERATIVE DIAGNOSES:   1.  Rotator cuff tear.   2.  Impingement syndrome of the right shoulder.  POSTOPERATIVE DIAGNOSES:   1.  Rotator cuff tear.  2.  Impingement syndrome of the right shoulder.  PROCEDURE PERFORMED: 1.  Right shoulder arthroscopy. 2.  Subacromial decompression, acromioplasty. 3.  Mini open rotator cuff repair utilizing 4 SwiveLock suture anchors.  ANESTHESIA:  General.  The patient had a scalene block preoperatively as well.  SURGEON:  Jene Every, MD  ASSISTANT:  Andrez Grime, PA  HISTORY:  A 49 year old with rotator cuff tear confirmed by MRI.  Supraspinatus with slight retraction.  Indicated for shoulder arthroscopy, subacromial decompression and mini open rotator cuff repair.  Risks and benefits discussed, including bleeding,  infection, damage to neurovascular structures, no change in symptoms, worsening symptoms, DVT, PE, anesthetic complications, etc.  TECHNIQUE:  With the patient in the beach chair position after the induction of adequate general anesthesia, 1 g vancomycin, the right shoulder and upper extremity was prepped and draped in the usual sterile fashion.  The patient has good range of motion  of the shoulder.  Surgical marker utilized to delineate the acromion, AC joint and the coracoid.  Standard posterolateral portal was utilized with an incision through the skin only with a #11 blade.  Cannula was then advanced through the glenohumeral joint in line with  the coracoid.  Tightness in the posterior capsule.  I therefore decided to forego the glenohumeral evaluation as his pathology was in the subacromial space.  I redirected in the subacromial space.  I triangulated with an anterolateral cannula after a  portal was fashioned  with a #11 blade.  Irrigant was utilized to insufflate the joint, 65 mmHg.  Inspection revealed a hypertrophic bursa.  A shaver was introduced and utilized to perform a full bursectomy.  Full thickness tear was identified in the  rotator cuff and the supraspinatus.  I used a shaver and shaved the anterolateral aspect of the acromion and performed a partial release of the CA ligament.  The Arthrocare wand was requested, however, the power station was unavailable.  I converted to a  mini open repair.  I removed all arthroscopic equipment and closed the portals with a 4-0 nylon simple sutures.  I made a 2.5 cm incision over the anterolateral aspect of the acromion after infiltration with 0.25% Marcaine with epinephrine.  The raphae  between the anterolateral heads of the deltoid was divided in line with the skin incision.  A self-retaining retractor was placed, a 2 mm Kerrison, removed a small spur off the anterolateral aspect of the acromion.  The rotator cuff tendon was  identified.  It was a crescent-type tear in traction.  I incised the degenerated portion of the tendon to good bleeding tissue, mobilized the tendon on the bursal and articular surface with an Allis and a Cobb.  I used a bare rongeur to perform a  bleeding bed in the greater tuberosity.  Following this, I then placed 2 SwiveLock suture anchors just lateral to the articular surface, separated by 1.5 cm.  I placed the awl and then inserted 2 SwiveLocks.  Excellent resistance to pullout was noted.   Two TigerTapes were utilized, 2 passed on the posterior leaflet and utilizing a Scorpion suture passer and 2 on the anterior and medial leaflet.  These were then crossed and placed into a second row configuration over the greater tuberosity after placing  2 pilot holes with an awl and inserting them with a SwiveLock.  One SwiveLock disassembled and required [place] to use a different SwiveLock.  The arm was placed at its side and then advanced a  centimeter.  It covered the greater tuberosity cancellous bed.   I placed it into the SwiveLocks.  Redundant suture removed.  I then used a rescue suture off the posterior SwiveLock, threaded it with a free needle in the posterior aspect of the supraspinatus as this was slightly torn and advanced it to the trough with  a surgical knife.  The remainder of the cuff was unremarkable.  We copiously irrigated with antibiotic irrigation.  I then repaired the raphae with 0 Vicryl interrupted figure-of-eight sutures, subcutaneous with 2-0 and skin with Prolene.  Sterile  dressing applied, placed in a sterile dressing, placed in a sling, extubated and transported to the recovery room in satisfactory condition.  The patient tolerated the procedure well.  No complications.  Assistant Lacie Draft, [place].  Minimal blood loss.  VN/NUANCE  D:02/14/2020 T:02/14/2020 JOB:[id number]

## 2020-02-14 NOTE — Interval H&P Note (Signed)
History and Physical Interval Note:  02/14/2020 12:34 PM  [name]  has presented today for surgery, with the diagnosis of Right shoulder impingement, rotator cuff repair.  The various methods of treatment have been discussed with the patient and family. After consideration of risks, benefits and other options for treatment, the patient has consented to  Procedure(s) with comments: SHOULDER ARTHROSCOPY WITH MINI OPEN ROTATOR CUFF REPAIR AND SUBACROMIAL DECOMPRESSION AND DEBRIDEMENT (Right) - 90 MINS BLOCK OK- NO EXPAREL as a surgical intervention.  The patient's history has been reviewed, patient examined, no change in status, stable for surgery.  I have reviewed the patient's chart and labs.  Questions were answered to the patient's satisfaction.     Javier Docker

## 2020-02-14 NOTE — Anesthesia Preprocedure Evaluation (Signed)
Anesthesia Evaluation  Patient identified by MRN, date of birth, ID band Patient awake    Reviewed: Allergy & Precautions, H&P , NPO status , Patient's Chart, lab work & pertinent test results, reviewed documented beta blocker date and time   Airway Mallampati: I  TM Distance: >3 FB Neck ROM: full    Dental no notable dental hx. (+) Teeth Intact, Dental Advisory Given   Pulmonary neg pulmonary ROS, former smoker,    Pulmonary exam normal breath sounds clear to auscultation       Cardiovascular Exercise Tolerance: Good negative cardio ROS   Rhythm:regular Rate:Normal     Neuro/Psych negative neurological ROS  negative psych ROS   GI/Hepatic negative GI ROS, Neg liver ROS, GERD  ,  Endo/Other  negative endocrine ROS  Renal/GU negative Renal ROS  negative genitourinary   Musculoskeletal negative musculoskeletal ROS (+) Arthritis ,   Abdominal   Peds negative pediatric ROS (+)  Hematology negative hematology ROS (+)   Anesthesia Other Findings   Reproductive/Obstetrics negative OB ROS                             Anesthesia Physical Anesthesia Plan  ASA: II  Anesthesia Plan: General   Post-op Pain Management: GA combined w/ Regional for post-op pain   Induction:   PONV Risk Score and Plan: 2 and Dexamethasone, Treatment may vary due to age or medical condition and Ondansetron  Airway Management Planned: Oral ETT and LMA  Additional Equipment:   Intra-op Plan:   Post-operative Plan: Extubation in OR  Informed Consent: I have reviewed the patients History and Physical, chart, labs and discussed the procedure including the risks, benefits and alternatives for the proposed anesthesia with the patient or authorized representative who has indicated his/her understanding and acceptance.     Dental Advisory Given  Plan Discussed with: CRNA, Anesthesiologist and  Surgeon  Anesthesia Plan Comments: (Discussed both nerve block for pain relief post-op and GA; including NV, sore throat, dental injury, and pulmonary complications)        Anesthesia Quick Evaluation

## 2020-02-14 NOTE — Anesthesia Procedure Notes (Signed)
Anesthesia Regional Block: Interscalene brachial plexus block   Pre-Anesthetic Checklist: ,, timeout performed, Correct Patient, Correct Site, Correct Laterality, Correct Procedure, Correct Position, site marked, Risks and benefits discussed,  Surgical consent,  Pre-op evaluation,  At surgeon's request and post-op pain management  Laterality: Right  Prep: chloraprep       Needles:  Injection technique: Single-shot  Needle Type: Echogenic Stimulator Needle     Needle Length: 5cm  Needle Gauge: 22     Additional Needles:   Procedures:, nerve stimulator,,, ultrasound used (permanent image in chart),,,,   Nerve Stimulator or Paresthesia:  Response: hand, 0.45 mA,   Additional Responses:   Narrative:  Start time: 02/14/2020 12:40 PM End time: 02/14/2020 12:50 PM Injection made incrementally with aspirations every 5 mL.  Performed by: Personally  Anesthesiologist: Bethena Midget, MD  Additional Notes: Functioning IV was confirmed and monitors were applied.  A 18mm 22ga Arrow echogenic stimulator needle was used. Sterile prep and drape,hand hygiene and sterile gloves were used. Ultrasound guidance: relevant anatomy identified, needle position confirmed, local anesthetic spread visualized around nerve(s)., vascular puncture avoided.  Image printed for medical record. Negative aspiration and negative test dose prior to incremental administration of local anesthetic. The patient tolerated the procedure well.

## 2020-02-15 DIAGNOSIS — M7541 Impingement syndrome of right shoulder: Secondary | ICD-10-CM | POA: Diagnosis not present

## 2020-02-15 DIAGNOSIS — M75121 Complete rotator cuff tear or rupture of right shoulder, not specified as traumatic: Secondary | ICD-10-CM | POA: Diagnosis not present

## 2020-02-15 DIAGNOSIS — Z7951 Long term (current) use of inhaled steroids: Secondary | ICD-10-CM | POA: Diagnosis not present

## 2020-02-15 DIAGNOSIS — Z8614 Personal history of Methicillin resistant Staphylococcus aureus infection: Secondary | ICD-10-CM | POA: Diagnosis not present

## 2020-02-15 DIAGNOSIS — Z87891 Personal history of nicotine dependence: Secondary | ICD-10-CM | POA: Diagnosis not present

## 2020-02-15 DIAGNOSIS — J449 Chronic obstructive pulmonary disease, unspecified: Secondary | ICD-10-CM | POA: Diagnosis not present

## 2020-02-15 DIAGNOSIS — M75120 Complete rotator cuff tear or rupture of unspecified shoulder, not specified as traumatic: Secondary | ICD-10-CM | POA: Diagnosis not present

## 2020-02-15 NOTE — Progress Notes (Signed)
Assessment unchanged. Pt verbalized and demonstrated understanding of dc instructions through teach back. Understands medications to resume as well as follow up care. Discharged via wc to front entrance accompanied by girlfriend and NT.

## 2020-02-15 NOTE — Evaluation (Signed)
Occupational Therapy Evaluation and Discharge Patient Details Name: [name] MRN: [id number] DOB: 1971/02/15 Today's Date: 02/15/2020    History of Present Illness s/p R shoulder arthroscopy with RCR, subacromial decompression and debridement PMH: COPD, ADHD.   Clinical Impression   All education completed as per MD order and reinforced with written handouts. Pt and girlfriend verbalized and/or demonstrated understanding. No further OT needs.    Follow Up Recommendations  Follow surgeon's recommendation for DC plan and follow-up therapies    Equipment Recommendations  None recommended by OT    Recommendations for Other Services       Precautions / Restrictions Precautions Precautions: Shoulder Type of Shoulder Precautions: pendulums, AROM elbow to hand, no Active shoulder movement Shoulder Interventions: Shoulder sling/immobilizer;For comfort(and sleep) Precaution Booklet Issued: Yes (comment) Required Braces or Orthoses: Sling Restrictions Weight Bearing Restrictions: Yes RUE Weight Bearing: Non weight bearing      Mobility Bed Mobility Overal bed mobility: Modified Independent                Transfers Overall transfer level: Independent                    Balance                                           ADL either performed or assessed with clinical judgement   ADL                                         General ADL Comments: Educated in positioning R UE in bed and chair, sling use and wearing schedule, compensatory strategies for ADL.     Vision Baseline Vision/History: No visual deficits Patient Visual Report: No change from baseline       Perception     Praxis      Pertinent Vitals/Pain Pain Assessment: No/denies pain     Hand Dominance Right   Extremity/Trunk Assessment Upper Extremity Assessment Upper Extremity Assessment: RUE deficits/detail RUE Deficits / Details: Performed  pendulums and ROM elbow to hand, nerve block intact           Communication Communication Communication: No difficulties   Cognition Arousal/Alertness: Awake/alert Behavior During Therapy: WFL for tasks assessed/performed Overall Cognitive Status: Within Functional Limits for tasks assessed                                     General Comments       Exercises     Shoulder Instructions      Home Living Family/patient expects to be discharged to:: Private residence Living Arrangements: Spouse/significant other Available Help at Discharge: Available 24 hours/day                                    Prior Functioning/Environment Level of Independence: Independent        Comments: works in maintenance        OT Problem List:        OT Treatment/Interventions:      OT Goals(Current goals can be found in the care plan section)    OT Frequency:  Barriers to D/C:            Co-evaluation              AM-PAC OT "6 Clicks" Daily Activity     Outcome Measure Help from another person eating meals?: A Little Help from another person taking care of personal grooming?: None Help from another person toileting, which includes using toliet, bedpan, or urinal?: None Help from another person bathing (including washing, rinsing, drying)?: A Little Help from another person to put on and taking off regular upper body clothing?: A Little Help from another person to put on and taking off regular lower body clothing?: None 6 Click Score: 21   End of Session    Activity Tolerance: Patient tolerated treatment well Patient left: in chair;with call bell/phone within reach;with family/visitor present  OT Visit Diagnosis: Muscle weakness (generalized) (M62.81)                Time: 1610-9604 OT Time Calculation (min): 37 min Charges:  OT General Charges $OT Visit: 1 Visit OT Evaluation $OT Eval Low Complexity: 1 Low OT  Treatments $Therapeutic Exercise: 8-22 mins  Martie Round, OTR/L Acute Rehabilitation Services Pager: [phone] Office: [phone]  Evern Bio 02/15/2020, 9:27 AM

## 2020-02-15 NOTE — Progress Notes (Signed)
Subjective: 1 Day Post-Op Procedure(s) (LRB): SHOULDER ARTHROSCOPY WITH MINI OPEN ROTATOR CUFF REPAIR AND SUBACROMIAL DECOMPRESSION AND DEBRIDEMENT (Right)  Patient seen in rounds for Dr. Shelle Iron Patient reports pain as 0 on 0-10 scale.   Patient still noticing a block at this time.  Reports numbness in his first 2 fingers. Sitting comfortably in the bed with his sling on  Objective: Vital signs in last 24 hours: Temp:  [97.6 F (36.4 C)-98.3 F (36.8 C)] 98 F (36.7 C) (04/24 0641) Pulse Rate:  [68-105] 90 (04/24 0641) Resp:  [5-23] 14 (04/24 0641) BP: (110-143)/(63-103) 130/83 (04/24 0641) SpO2:  [94 %-100 %] 95 % (04/24 0641) Weight:  [73.9 kg] 73.9 kg (04/23 1125)  Intake/Output from previous day: 04/23 0701 - 04/24 0700 In: 2388.1 [P.O.:480; I.V.:1508.1; IV Piggyback:400] Out: -  Intake/Output this shift: Total I/O In: 50.1 [I.V.:50.1] Out: -   Recent Labs    02/13/20 0813  HGB 14.2   Recent Labs    02/13/20 0813  WBC 7.8  RBC 4.54  HCT 43.1  PLT 350   Recent Labs    02/13/20 0813  NA 137  K 4.6  CL 108  CO2 22  BUN 22*  CREATININE 0.73  GLUCOSE 98  CALCIUM 9.3   No results for input(s): LABPT, INR in the last 72 hours.  Neurologically intact Intact pulses distally Incision: dressing C/D/I Compartment soft Patient is unable to flex or extend at right elbow, wrist due to block.   Decreased sensation in first 2 digits.   Assessment/Plan: 1 Day Post-Op Procedure(s) (LRB): SHOULDER ARTHROSCOPY WITH MINI OPEN ROTATOR CUFF REPAIR AND SUBACROMIAL DECOMPRESSION AND DEBRIDEMENT (Right) Patient is doing well this morning. Plan is to discharge patient home this morning Medications have already been sent over to pharmacy. Will remain in the sling until follow-up appointment      Denman George [facility] [phone] 02/15/2020, 8:17 AM

## 2020-02-16 NOTE — Anesthesia Postprocedure Evaluation (Signed)
Anesthesia Post Note  Patient: [name]  Procedure(s) Performed: SHOULDER ARTHROSCOPY WITH MINI OPEN ROTATOR CUFF REPAIR AND SUBACROMIAL DECOMPRESSION AND DEBRIDEMENT (Right )     Patient location during evaluation: PACU Anesthesia Type: General Level of consciousness: awake and alert Pain management: pain level controlled Vital Signs Assessment: post-procedure vital signs reviewed and stable Respiratory status: spontaneous breathing, nonlabored ventilation, respiratory function stable and patient connected to nasal cannula oxygen Cardiovascular status: blood pressure returned to baseline and stable Postop Assessment: no apparent nausea or vomiting Anesthetic complications: no    Last Vitals:  Vitals:   02/15/20 0148 02/15/20 0641  BP: 114/90 130/83  Pulse: 91 90  Resp: 18 14  Temp: 36.4 C 36.7 C  SpO2: 94% 95%    Last Pain:  Vitals:   02/15/20 0800  TempSrc:   PainSc: 0-No pain                 Shannell Mikkelsen

## 2020-02-17 ENCOUNTER — Encounter: Payer: Self-pay | Admitting: *Deleted

## 2020-02-20 NOTE — Discharge Summary (Addendum)
Patient ID: [name] MRN: [id number] DOB/AGE: [birth year] 49 y.o.  Admit date: 02/14/2020 Discharge date: 02/15/2020  Admission Diagnoses:  Active Problems:   Complete rotator cuff tear   History of arthroplasty of right shoulder   Discharge Diagnoses:  Same  Past Medical History:  Diagnosis Date  . ADHD (attention deficit hyperactivity disorder)   . Asthma   . COPD (chronic obstructive pulmonary disease) (HCC)   . GERD (gastroesophageal reflux disease)   . Heart murmur    as a child     Surgeries: Procedure(s): SHOULDER ARTHROSCOPY WITH MINI OPEN ROTATOR CUFF REPAIR AND SUBACROMIAL DECOMPRESSION AND DEBRIDEMENT on 02/14/2020   Consultants:   Discharged Condition: Improved  Hospital Course: [name] is an 49 y.o. male who was admitted 02/14/2020 for operative treatment of<principal problem not specified>. Patient has severe unremitting pain that affects sleep, daily activities, and work/hobbies. After pre-op clearance the patient was taken to the operating room on 02/14/2020 and underwent  Procedure(s): SHOULDER ARTHROSCOPY WITH MINI OPEN ROTATOR CUFF REPAIR AND SUBACROMIAL DECOMPRESSION AND DEBRIDEMENT.    Patient was given perioperative antibiotics:  Anti-infectives (From admission, onward)   Start     Dose/Rate Route Frequency Ordered Stop   02/15/20 0000  vancomycin (VANCOCIN) IVPB 1000 mg/200 mL premix     1,000 mg 200 mL/hr over 60 Minutes Intravenous  Once 02/14/20 1736 02/15/20 0240   02/14/20 1402  polymyxin B 500,000 Units, bacitracin 50,000 Units in sodium chloride 0.9 % 500 mL irrigation  Status:  Discontinued       As needed 02/14/20 1402 02/14/20 1708   02/14/20 1315  vancomycin (VANCOCIN) IVPB 1000 mg/200 mL premix     1,000 mg 200 mL/hr over 60 Minutes Intravenous  Once 02/14/20 1300 02/14/20 1421   02/14/20 1230  vancomycin (VANCOCIN) 1-5 GM/200ML-% IVPB    Note to Pharmacy: Kathe Becton   : cabinet override      02/14/20 1230 02/14/20  1246   02/14/20 1201  vancomycin (VANCOCIN) 1-5 GM/200ML-% IVPB  Status:  Discontinued    Note to Pharmacy: Kathe Becton   : cabinet override      02/14/20 1201 02/14/20 1207       Patient was given sequential compression devices, early ambulation, and chemoprophylaxis to prevent DVT.  Patient benefited maximally from hospital stay and there were no complications.    Recent vital signs: No data found.   Recent laboratory studies: No results for input(s): WBC, HGB, HCT, PLT, NA, K, CL, CO2, BUN, CREATININE, GLUCOSE, INR, CALCIUM in the last 72 hours.  Invalid input(s): PT, 2   Discharge Medications:   Allergies as of 02/15/2020   No Known Allergies     Medication List    TAKE these medications   albuterol 108 (90 Base) MCG/ACT inhaler Commonly known as: VENTOLIN HFA Inhale 2 puffs into the lungs every 4 (four) hours as needed for wheezing or shortness of breath.   cyclobenzaprine 10 MG tablet Commonly known as: FLEXERIL Take 1 tablet (10 mg total) by mouth 3 (three) times daily as needed for muscle spasms.   docusate sodium 100 MG capsule Commonly known as: Colace Take 1 capsule (100 mg total) by mouth 2 (two) times daily as needed for mild constipation.   mupirocin ointment 2 % Commonly known as: BACTROBAN Apply 1 application topically 2 (two) times daily.   oxyCODONE-acetaminophen 5-325 MG tablet Commonly known as: Percocet Take 1-2 tablets by mouth every 6 (six) hours as needed for severe pain.  traMADol 50 MG tablet Commonly known as: ULTRAM Take 50 mg by mouth 2 (two) times daily as needed for moderate pain.   Trelegy Ellipta 100-62.5-25 MCG/INH Aepb Generic drug: Fluticasone-Umeclidin-Vilant Inhale 1 puff into the lungs daily.       Diagnostic Studies: DG Chest 2 View  Result Date: 01/21/2020 CLINICAL DATA:  Bronchitis, COPD, asthma EXAM: CHEST - 2 VIEW COMPARISON:  11/19/2018 FINDINGS: Frontal and lateral views of the chest demonstrate a stable  cardiac silhouette. Mild background emphysema without airspace disease, effusion, or pneumothorax. No acute bony abnormalities. IMPRESSION: 1. Stable exam, no acute process. Electronically Signed   By: Randa Ngo M.D.   On: 01/21/2020 21:04    Disposition: Discharge disposition: 01-Home or Self Care       Discharge Instructions    Call MD / Call 911   Complete by: As directed    If you experience chest pain or shortness of breath, CALL 911 and be transported to the hospital emergency room.  If you develope a fever above 101 F, pus (white drainage) or increased drainage or redness at the wound, or calf pain, call your surgeon's office.   Constipation Prevention   Complete by: As directed    Drink plenty of fluids.  Prune juice may be helpful.  You may use a stool softener, such as Colace (over the counter) 100 mg twice a day.  Use MiraLax (over the counter) for constipation as needed.   Diet - low sodium heart healthy   Complete by: As directed    Increase activity slowly as tolerated   Complete by: As directed       Follow-up Information    Susa Day, MD Follow up in 2 week(s).   Specialty: Orthopedic Surgery Contact information: [address] [place] [address] [phone]            Signed: Cecilie Kicks 02/20/2020, 8:17 AM

## 2020-03-03 NOTE — Telephone Encounter (Signed)
To: [facility] POOL    From: Masaru Chamberlin Pelland    Created: 03/03/2020 5:27 PM     *-*-*This message was handled on 03/03/2020 5:35 PM by Pearlie Oyster, Tarig Zimmers P*-*-*  Hi Dr. Clent Ridges,   When I purchased my medication (Trelegy) the first time with insurance it was $100. This time I tried to use a coupon that I had and it actually would have been almost $400 with that particular coupon. I didn't know if you or your office offer any kind of money saving coupons or anything similar to help reduce the cost?   Thank you,      Kathlene November Michelotti     Above [employer] sent by pt. Amber/Rachael, is there any way you can help [place] out with this for pt.

## 2020-03-04 NOTE — Telephone Encounter (Signed)
No problem, Thank you [facility]

## 2020-03-09 NOTE — Telephone Encounter (Signed)
Sounds like patient was attempting to use a discount card instead of a Trelegy copay card. Called patient, he states he found and enrolled into the copay card and filled his Trelegy for $0.00 last week. Nothing further needed.

## 2020-03-09 NOTE — Telephone Encounter (Signed)
Thank you :)

## 2020-03-16 DIAGNOSIS — M25511 Pain in right shoulder: Secondary | ICD-10-CM | POA: Diagnosis not present

## 2020-03-25 DIAGNOSIS — M25511 Pain in right shoulder: Secondary | ICD-10-CM | POA: Diagnosis not present

## 2020-03-27 DIAGNOSIS — M25511 Pain in right shoulder: Secondary | ICD-10-CM | POA: Diagnosis not present

## 2020-03-30 DIAGNOSIS — M25511 Pain in right shoulder: Secondary | ICD-10-CM | POA: Diagnosis not present

## 2020-04-02 DIAGNOSIS — M25511 Pain in right shoulder: Secondary | ICD-10-CM | POA: Diagnosis not present

## 2020-04-09 DIAGNOSIS — M25512 Pain in left shoulder: Secondary | ICD-10-CM | POA: Diagnosis not present

## 2020-04-10 DIAGNOSIS — M25511 Pain in right shoulder: Secondary | ICD-10-CM | POA: Diagnosis not present

## 2020-04-20 DIAGNOSIS — M25511 Pain in right shoulder: Secondary | ICD-10-CM | POA: Diagnosis not present

## 2020-04-24 DIAGNOSIS — M25511 Pain in right shoulder: Secondary | ICD-10-CM | POA: Diagnosis not present

## 2020-04-28 DIAGNOSIS — M25511 Pain in right shoulder: Secondary | ICD-10-CM | POA: Diagnosis not present

## 2020-04-30 DIAGNOSIS — M25511 Pain in right shoulder: Secondary | ICD-10-CM | POA: Diagnosis not present

## 2020-05-05 DIAGNOSIS — M25511 Pain in right shoulder: Secondary | ICD-10-CM | POA: Diagnosis not present

## 2020-05-08 DIAGNOSIS — M25511 Pain in right shoulder: Secondary | ICD-10-CM | POA: Diagnosis not present

## 2020-05-13 DIAGNOSIS — M25511 Pain in right shoulder: Secondary | ICD-10-CM | POA: Diagnosis not present

## 2020-05-15 DIAGNOSIS — M25511 Pain in right shoulder: Secondary | ICD-10-CM | POA: Diagnosis not present

## 2020-05-19 DIAGNOSIS — M25511 Pain in right shoulder: Secondary | ICD-10-CM | POA: Diagnosis not present

## 2020-05-21 DIAGNOSIS — Z471 Aftercare following joint replacement surgery: Secondary | ICD-10-CM | POA: Diagnosis not present

## 2020-05-21 DIAGNOSIS — Z96611 Presence of right artificial shoulder joint: Secondary | ICD-10-CM | POA: Diagnosis not present

## 2020-05-22 DIAGNOSIS — M25511 Pain in right shoulder: Secondary | ICD-10-CM | POA: Diagnosis not present

## 2020-05-26 DIAGNOSIS — M25511 Pain in right shoulder: Secondary | ICD-10-CM | POA: Diagnosis not present

## 2020-05-29 DIAGNOSIS — M25511 Pain in right shoulder: Secondary | ICD-10-CM | POA: Diagnosis not present

## 2020-06-02 DIAGNOSIS — M25511 Pain in right shoulder: Secondary | ICD-10-CM | POA: Diagnosis not present

## 2020-06-05 DIAGNOSIS — M25511 Pain in right shoulder: Secondary | ICD-10-CM | POA: Diagnosis not present

## 2020-06-08 ENCOUNTER — Other Ambulatory Visit: Payer: Self-pay | Admitting: Primary Care

## 2020-06-10 DIAGNOSIS — M25511 Pain in right shoulder: Secondary | ICD-10-CM | POA: Diagnosis not present

## 2020-06-17 DIAGNOSIS — M25511 Pain in right shoulder: Secondary | ICD-10-CM | POA: Diagnosis not present

## 2020-06-24 DIAGNOSIS — M25511 Pain in right shoulder: Secondary | ICD-10-CM | POA: Diagnosis not present

## 2020-07-01 DIAGNOSIS — M25511 Pain in right shoulder: Secondary | ICD-10-CM | POA: Diagnosis not present

## 2020-07-02 DIAGNOSIS — M25511 Pain in right shoulder: Secondary | ICD-10-CM | POA: Diagnosis not present

## 2020-08-03 ENCOUNTER — Ambulatory Visit (INDEPENDENT_AMBULATORY_CARE_PROVIDER_SITE_OTHER): Payer: BC Managed Care – PPO | Admitting: Primary Care

## 2020-08-03 ENCOUNTER — Encounter: Payer: Self-pay | Admitting: Primary Care

## 2020-08-03 ENCOUNTER — Other Ambulatory Visit: Payer: Self-pay

## 2020-08-03 DIAGNOSIS — J449 Chronic obstructive pulmonary disease, unspecified: Secondary | ICD-10-CM

## 2020-08-03 MED ORDER — TRELEGY ELLIPTA 200-62.5-25 MCG/INH IN AEPB: 1.0000 | INHALATION_SPRAY | Freq: Every day | RESPIRATORY_TRACT | 0 refills | Status: DC

## 2020-08-03 MED ORDER — PREDNISONE 10 MG PO TABS: ORAL_TABLET | ORAL | 0 refills | Status: DC

## 2020-08-03 MED ORDER — TRELEGY ELLIPTA 200-62.5-25 MCG/INH IN AEPB: 1.0000 | INHALATION_SPRAY | Freq: Every day | RESPIRATORY_TRACT | 5 refills | Status: DC

## 2020-08-03 NOTE — Assessment & Plan Note (Addendum)
-   Continues to have breakthrough wheezing/chest tightness and cough. Experiences 2-3 exacerbations on average in 6 months (not treated in office or requiring abx) - Change to Trelegy 200 and adding Montelukast 10mg  at bedtime  - Continue Albuterol 2 puffs every 4-6 hours as needed for breakthrough shortness of breath/wheezing; Mucinex 600mg  twice daily prn congestion  - Rx Prednisone taper as directed (40mg  x 2 days; 30mg  x 2 days; 20mg  x 2 days; 10mg  x 2 days) - Review new medication and potential side effects with patient, he will notify our office if he does not tolerate them - FU in 6 months or sooner if symptoms do not improve/worsen with above plan

## 2020-08-03 NOTE — Patient Instructions (Addendum)
Nice seeing you today  Recommendations: - Change to Trelegy 200 (this is a higher dose inhaled steriod and replaces Trelegy 100) - Continue Albuterol 2 puffs every 4-6 hours as needed for breakthrough shortness of breath/wheezing - Adding Montelukast (Singulair) 10mg  at bedtime for allergies/asthma   Rx: - Prednisone taper as directed  - Trelegy Ellipta 200 - one puff daily (rinse mouth after use) - Montelukast 10mg  (Singulair)  Follow-up: - 6 months with Dr. / or sooner if symptoms worsen or you do not tolerate new medication listed above     Montelukast oral tablets What is this medicine? MONTELUKAST (mon te LOO kast) is used to prevent and treat the symptoms of asthma. It is also used to treat allergies. Do not use for an acute asthma attack. This medicine may be used for other purposes; ask your health care provider or pharmacist if you have questions. COMMON BRAND NAME(S): Singulair What should I tell my health care provider before I take this medicine? They need to know if you have any of these conditions:  liver disease  an unusual or allergic reaction to montelukast, other medicines, foods, dyes, or preservatives  pregnant or trying to get pregnant  breast-feeding How should I use this medicine? This medicine should be given by mouth. Follow the directions on the prescription label. Take this medicine at the same time every day. You may take this medicine with or without meals. Do not chew the tablets. Do not stop taking your medicine unless your doctor tells you to. Talk to your pediatrician regarding the use of this medicine in children. Special care may be needed. While this drug may be prescribed for children as young as 63 years of age for selected conditions, precautions do apply. Overdosage: If you think you have taken too much of this medicine contact a poison control center or emergency room at once. NOTE: This medicine is only for you. Do not share this  medicine with others. What if I miss a dose? If you miss a dose, skip it. Take your next dose at the normal time. Do not take extra or 2 doses at the same time to make up for the missed dose. What may interact with this medicine?  anti-infectives like rifampin and rifabutin  medicines for seizures like phenytoin, phenobarbital, and carbamazepine This list may not describe all possible interactions. Give your health care provider a list of all the medicines, herbs, non-prescription drugs, or dietary supplements you use. Also tell them if you smoke, drink alcohol, or use illegal drugs. Some items may interact with your medicine. What should I watch for while using this medicine? Visit your doctor or health care professional for regular checks on your progress. Tell your doctor or health care professional if your allergy or asthma symptoms do not improve. Take your medicine even when you do not have symptoms. Do not stop taking any of your medicine(s) unless your doctor tells you to. If you have asthma, talk to your doctor about what to do in an acute asthma attack. Always have your inhaled rescue medicine for asthma attacks with you. Patients and their families should watch for new or worsening thoughts of suicide or depression. Also watch for sudden changes in feelings such as feeling anxious, agitated, panicky, irritable, hostile, aggressive, impulsive, severely restless, overly excited and hyperactive, or not being able to sleep. Any worsening of mood or thoughts of suicide or dying should be reported to your health care professional right away. What side  effects may I notice from receiving this medicine? Side effects that you should report to your doctor or health care professional as soon as possible:  allergic reactions like skin rash or hives, or swelling of the face, lips, or tongue  breathing problems  changes in emotions or moods  confusion  depressed mood  fever or  infection  hallucinations  joint pain  painful lumps under the skin  pain, tingling, numbness in the hands or feet  redness, blistering, peeling, or loosening of the skin, including inside the mouth  restlessness  seizures  sleep walking  signs and symptoms of infection like fever; chills; cough; sore throat; flu-like illness  signs and symptoms of liver injury like dark yellow or brown urine; general ill feeling or flu-like symptoms; light-colored stools; loss of appetite; nausea; right upper belly pain; unusually weak or tired; yellowing of the eyes or skin  sinus pain or swelling  stuttering  suicidal thoughts or other mood changes  tremors  trouble sleeping  uncontrolled muscle movements  unusual bleeding or bruising  vivid or bad dreams Side effects that usually do not require medical attention (report to your doctor or health care professional if they continue or are bothersome):  dizziness  drowsiness  headache  runny nose  stomach upset  tiredness This list may not describe all possible side effects. Call your doctor for medical advice about side effects. You may report side effects to FDA at 1-800-FDA-1088. Where should I keep my medicine? Keep out of the reach of children. Store at room temperature between 15 and 30 degrees C (59 and 86 degrees F). Protect from light and moisture. Keep this medicine in the original bottle. Throw away any unused medicine after the expiration date. NOTE: This sheet is a summary. It may not cover all possible information. If you have questions about this medicine, talk to your doctor, pharmacist, or health care provider.  2020 Elsevier/Gold Standard (2019-02-08 12:54:33)

## 2020-08-03 NOTE — Progress Notes (Signed)
@Patient  ID: [name], male    DOB: [birth year], 49 y.o.   MRN: [id number]  Chief Complaint  Patient presents with  . Follow-up    Pt states he has been doing okay since last visit. States he has had a few flare ups since last visit where he has been congested, has had some wheezing, and some increased SOB. Pt also has had an occ cough with clear to yellow phlegm.    Referring provider: [id number], MD  HPI: 49 year old male, former smoker.  Past medical history significant for mixed COPD/asthma.  Patient of Dr. 44. Maintained on Trelegy 200.  PFTs in February 2020 showed FEV1 1.77 (52%), ratio 52, +BD.   Previous [facility] pulmonary encounter: 01/21/2020 Patient presents today for an acute visit.  Accompanied by his girlfriend by phone call.  Patient was seen yesterday by Dr. 01/23/2020 for rotator cuff surgery and was found to have abnormal lung sounds on exam.  Patient's surgery was delayed and asked to follow-up with pulmonary office.  Patient states that he has been in normal health but that his breathing has been fair overall.  He reports getting winded easily with activities such as showering and hiking.  He experiences occasional chest tightness, wheezing and cough.  His cough is productive with a purulent yellow mucus.  01/31/2020 Patient presents today for 10 days follow-up COPD exacerbation and pre-op clearance. He was started on Trelegy during last visit and given Zpack. He is doing much better. Reports significant improvement in his breathing. He went hiking over the weekend and felt like this breathing was much less labored, His cough is also better, still productive but clearing. He ex[eroemces rare wheezing. No chest tightness. No shortness of breath. Planning for right shoulder surgery with Dr. 04/01/2020 surgical center, date to be determined after today's appointment. Clearance form will be faxed to their office.   08/03/2020- Interim hx Patient presents today for 6 month  follow-up COPD/Asthma. He has had a handful of flare ups over the last 6 months. He is compliant with Trelegy but will occasionally be without his prescription for 203 days before filling and will noticed his asthma symptoms are worse off inhaler. Reports that chest tightness and cough are typically worse in the morning. He has some wheezing today. Cough is productive with clear mucus. He is not currently taking mucinex but does reports that it helps when he uses it.     Pulmonary function testing: 12/05/18- FVC 3.43 (78%), FEV1 1.77 (52%), ratio 52, +BD   No Known Allergies  Immunization History  Administered Date(s) Administered  . Influenza,inj,Quad PF,6+ Mos 07/05/2017, 09/01/2018  . PFIZER SARS-COV-2 Vaccination 12/21/2019, 01/11/2020    Past Medical History:  Diagnosis Date  . ADHD (attention deficit hyperactivity disorder)   . Asthma   . COPD (chronic obstructive pulmonary disease) (HCC)   . GERD (gastroesophageal reflux disease)   . Heart murmur    as a child     Tobacco History: Social History   Tobacco Use  Smoking Status Former Smoker  . Packs/day: 1.00  . Years: 37.00  . Pack years: 37.00  . Types: Cigarettes  . Quit date: 2017  . Years since quitting: 4.7  Smokeless Tobacco Never Used   Counseling given: Not Answered   Outpatient Medications Prior to Visit  Medication Sig Dispense Refill  . albuterol (VENTOLIN HFA) 108 (90 Base) MCG/ACT inhaler Inhale 2 puffs into the lungs every 4 (four) hours as needed for wheezing or shortness  of breath. 8.5 g 2  . cyclobenzaprine (FLEXERIL) 10 MG tablet Take 1 tablet (10 mg total) by mouth 3 (three) times daily as needed for muscle spasms. 30 tablet 1  . oxyCODONE-acetaminophen (PERCOCET) 5-325 MG tablet Take 1-2 tablets by mouth every 6 (six) hours as needed for severe pain. 40 tablet 0  . traMADol (ULTRAM) 50 MG tablet Take 50 mg by mouth 2 (two) times daily as needed for moderate pain.     . TRELEGY ELLIPTA 100-62.5-25  MCG/INH AEPB INHALE 1 PUFF INTO THE LUNGS ONCE FOR 1 DOSE. 60 each 2  . docusate sodium (COLACE) 100 MG capsule Take 1 capsule (100 mg total) by mouth 2 (two) times daily as needed for mild constipation. 40 capsule 0  . mupirocin ointment (BACTROBAN) 2 % Apply 1 application topically 2 (two) times daily.      No facility-administered medications prior to visit.    Review of Systems  Review of Systems  Constitutional: Negative.   Respiratory: Positive for cough and wheezing. Negative for shortness of breath.   Cardiovascular: Negative.    Physical Exam  BP 110/62 (BP Location: Left Arm, Cuff Size: Normal)   Pulse 89   Temp 97.6 F (36.4 C) (Other (Comment)) Comment (Src): wrist  Ht 5\' 6"  (1.676 m)   Wt 163 lb (73.9 kg)   SpO2 95%   BMI 26.31 kg/m  Physical Exam Constitutional:      Appearance: Normal appearance.  HENT:     Head: Normocephalic and atraumatic.     Mouth/Throat:     Comments: Deferred d/t masking Cardiovascular:     Rate and Rhythm: Normal rate and regular rhythm.  Pulmonary:     Effort: Pulmonary effort is normal.     Breath sounds: Wheezing and rhonchi present.  Skin:    General: Skin is warm and dry.  Neurological:     General: No focal deficit present.     Mental Status: He is alert and oriented to person, place, and time. Mental status is at baseline.  Psychiatric:        Mood and Affect: Mood normal.        Behavior: Behavior normal.        Thought Content: Thought content normal.        Judgment: Judgment normal.      Lab Results:  CBC    Component Value Date/Time   WBC 7.8 02/13/2020 0813   RBC 4.54 02/13/2020 0813   HGB 14.2 02/13/2020 0813   HCT 43.1 02/13/2020 0813   PLT 350 02/13/2020 0813   MCV 94.9 02/13/2020 0813   MCH 31.3 02/13/2020 0813   MCHC 32.9 02/13/2020 0813   RDW 14.6 02/13/2020 0813   LYMPHSABS 2.5 02/13/2020 0813   MONOABS 0.8 02/13/2020 0813   EOSABS 0.3 02/13/2020 0813   BASOSABS 0.0 02/13/2020 0813     BMET    Component Value Date/Time   NA 137 02/13/2020 0813   K 4.6 02/13/2020 0813   CL 108 02/13/2020 0813   CO2 22 02/13/2020 0813   GLUCOSE 98 02/13/2020 0813   BUN 22 (H) 02/13/2020 0813   CREATININE 0.73 02/13/2020 0813   CALCIUM 9.3 02/13/2020 0813   GFRNONAA >60 02/13/2020 0813   GFRAA >60 02/13/2020 0813    BNP No results found for: BNP  ProBNP No results found for: PROBNP  Imaging: No results found.   Assessment & Plan:   COPD mixed type (HCC) - Continues to have breakthrough wheezing/chest tightness  and cough. Experiences 2-3 exacerbations on average in 6 months (not treated in office or requiring abx) - Change to Trelegy 200 and adding Montelukast 10mg  at bedtime  - Continue Albuterol 2 puffs every 4-6 hours as needed for breakthrough shortness of breath/wheezing; Mucinex 600mg  twice daily prn congestion  - Rx Prednisone taper as directed (40mg  x 2 days; 30mg  x 2 days; 20mg  x 2 days; 10mg  x 2 days) - Review new medication and potential side effects with patient, he will notify our office if he does not tolerate them - FU in 6 months or sooner if symptoms do not improve/worsen with above plan    , NP 08/03/2020

## 2020-08-21 ENCOUNTER — Other Ambulatory Visit: Payer: Self-pay | Admitting: Primary Care

## 2020-08-21 DIAGNOSIS — J441 Chronic obstructive pulmonary disease with (acute) exacerbation: Secondary | ICD-10-CM

## 2020-08-21 DIAGNOSIS — J4540 Moderate persistent asthma, uncomplicated: Secondary | ICD-10-CM

## 2020-09-16 IMAGING — DX DG CHEST 2V
2 series · 2 of 2 positions shown · non-contrast
Comparison: 11/19/2018

CLINICAL DATA: Bronchitis, COPD, asthma

EXAM:
CHEST - 2 VIEW

[chest pa]
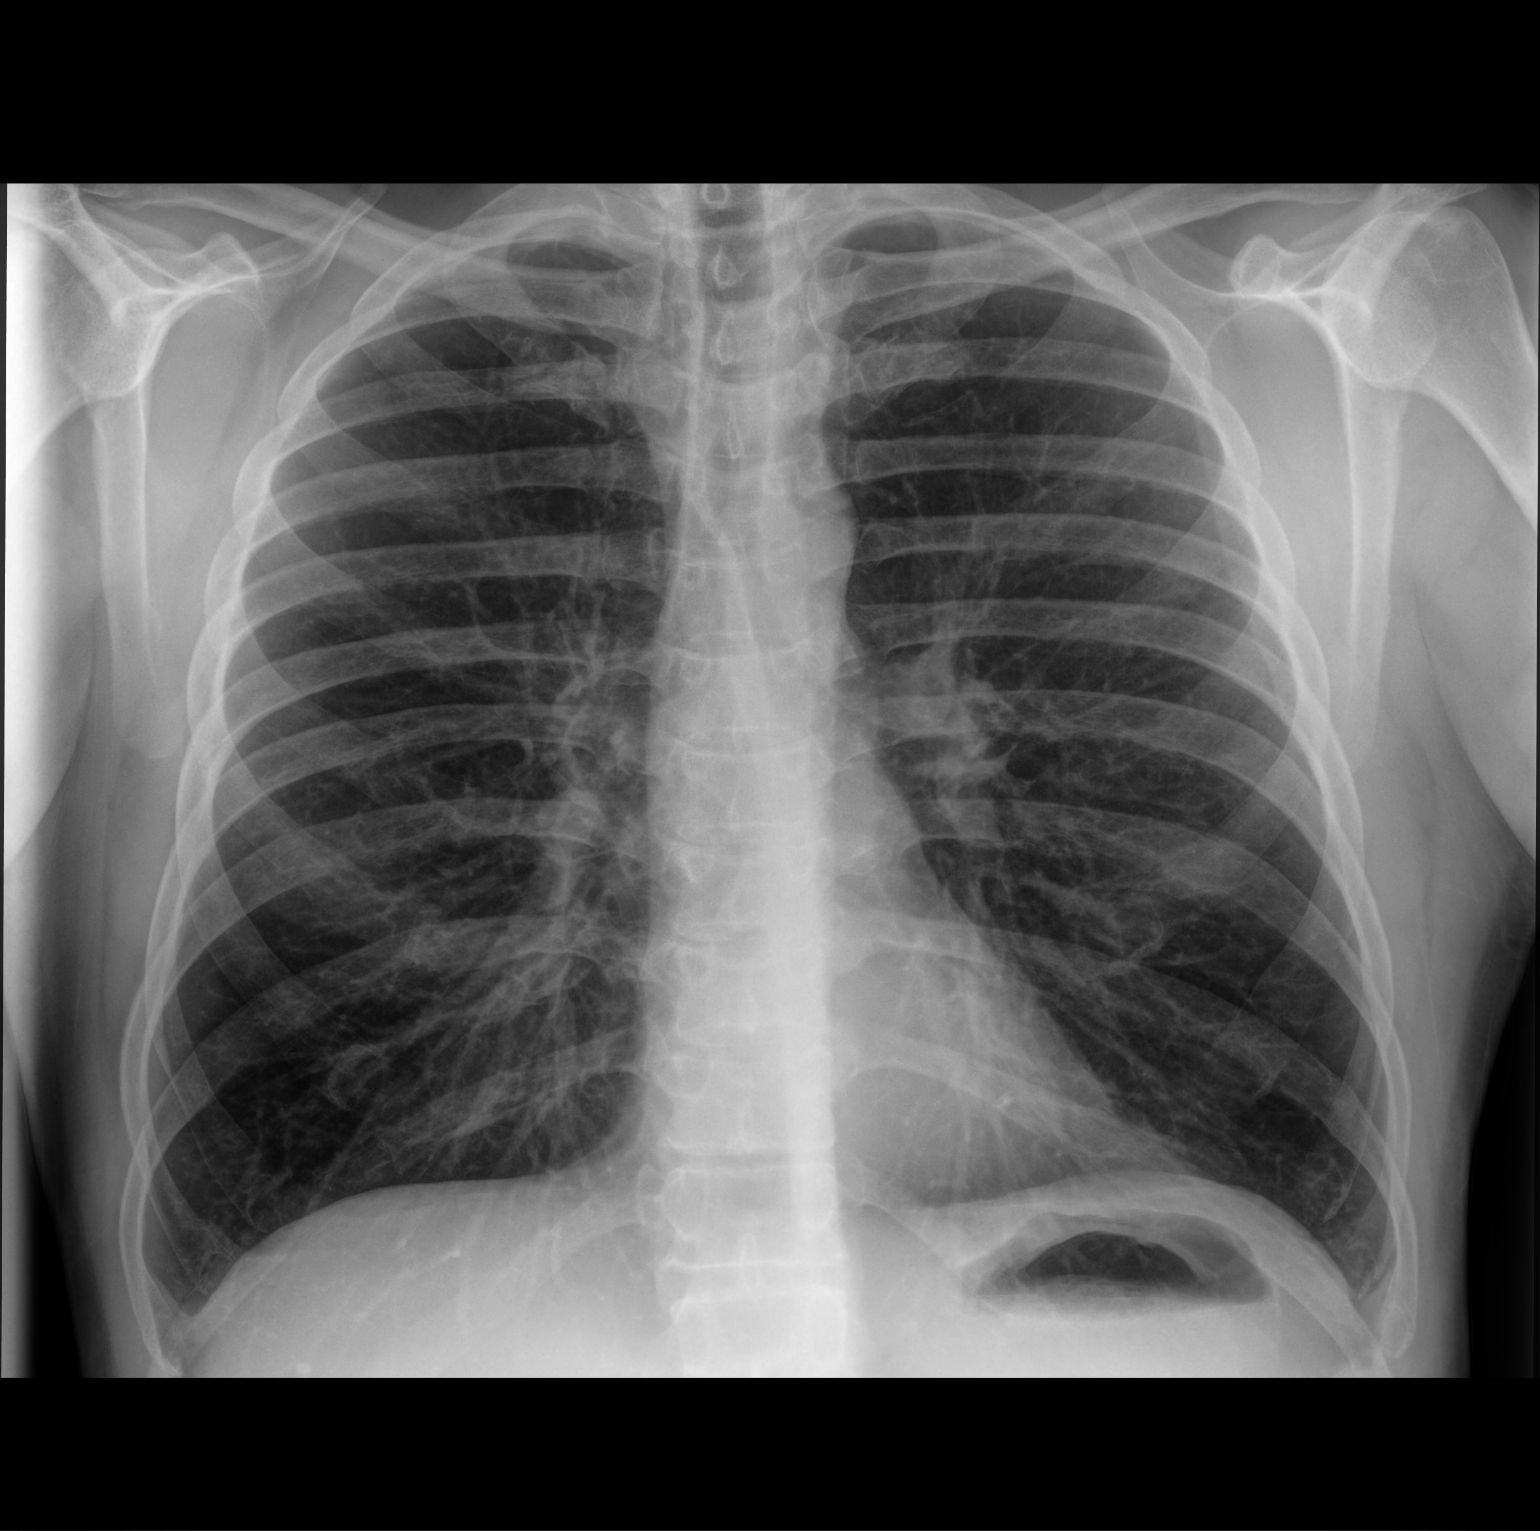

[chest lat]
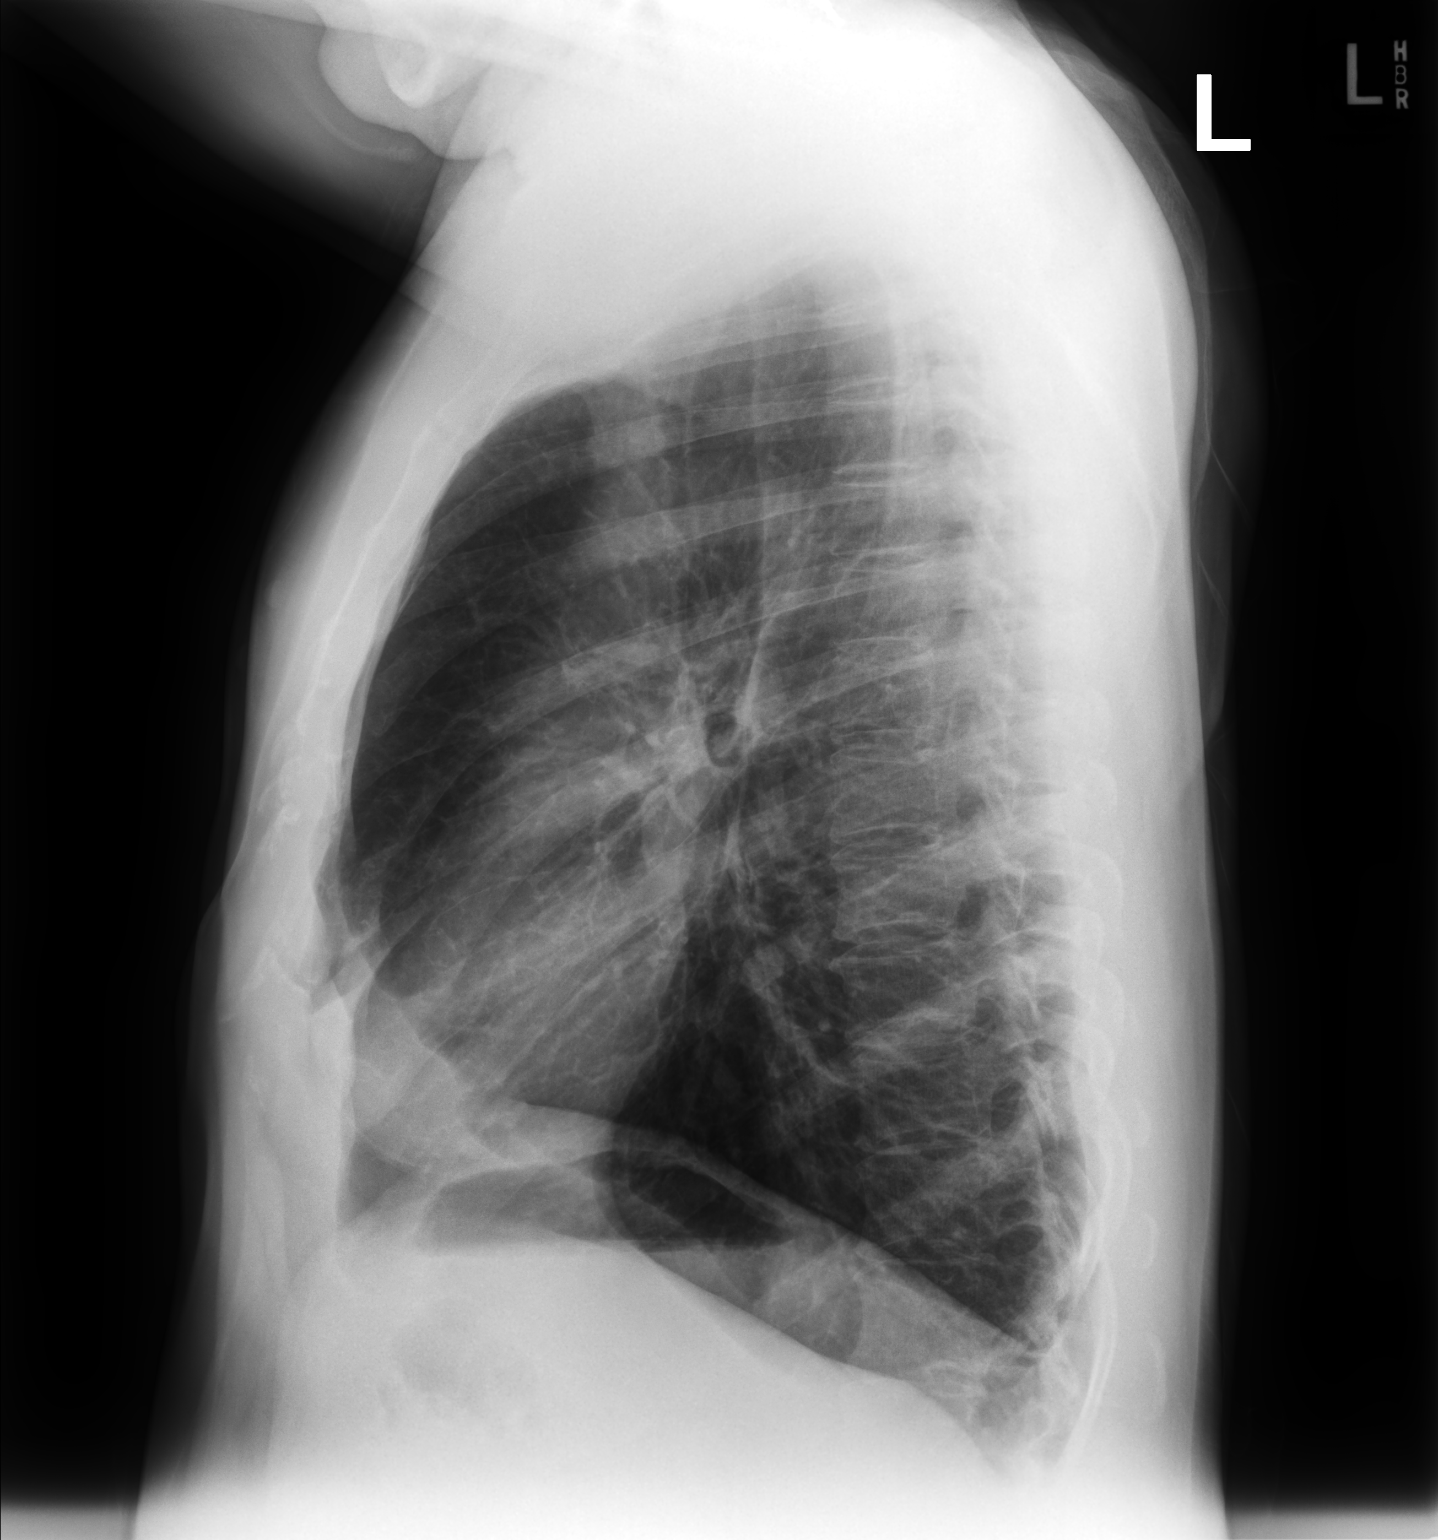

[2 of 2 positions shown; findings below may reference images not displayed]

FINDINGS: Frontal and lateral views of the chest demonstrate a stable cardiac
silhouette. Mild background emphysema without airspace disease,
effusion, or pneumothorax. No acute bony abnormalities.
IMPRESSION: 1. Stable exam, no acute process.

## 2020-12-15 DIAGNOSIS — M7542 Impingement syndrome of left shoulder: Secondary | ICD-10-CM | POA: Diagnosis not present

## 2020-12-15 DIAGNOSIS — M7541 Impingement syndrome of right shoulder: Secondary | ICD-10-CM | POA: Diagnosis not present

## 2020-12-27 ENCOUNTER — Other Ambulatory Visit: Payer: Self-pay | Admitting: Primary Care

## 2021-01-12 ENCOUNTER — Emergency Department (HOSPITAL_COMMUNITY)
Admission: EM | Admit: 2021-01-12 | Discharge: 2021-01-12 | Disposition: A | Discharge status: Home / Self Care | Payer: BC Managed Care – PPO | Attending: Emergency Medicine | Admitting: Emergency Medicine

## 2021-01-12 ENCOUNTER — Encounter (HOSPITAL_COMMUNITY): Payer: Self-pay

## 2021-01-12 DIAGNOSIS — Z5321 Procedure and treatment not carried out due to patient leaving prior to being seen by health care provider: Secondary | ICD-10-CM | POA: Insufficient documentation

## 2021-01-12 DIAGNOSIS — S61411A Laceration without foreign body of right hand, initial encounter: Secondary | ICD-10-CM | POA: Insufficient documentation

## 2021-01-12 DIAGNOSIS — W260XXA Contact with knife, initial encounter: Secondary | ICD-10-CM | POA: Insufficient documentation

## 2021-01-12 DIAGNOSIS — S6991XA Unspecified injury of right wrist, hand and finger(s), initial encounter: Secondary | ICD-10-CM | POA: Diagnosis not present

## 2021-01-12 NOTE — ED Notes (Signed)
Pt states that he doesn't want to wait to be seen

## 2021-01-12 NOTE — ED Triage Notes (Signed)
Pt reports laceration to right hand by backside of butter knife. Pt reports the cut is pretty deep. Bleeding is controlled

## 2021-01-13 ENCOUNTER — Ambulatory Visit (HOSPITAL_COMMUNITY)
Admission: EM | Admit: 2021-01-13 | Discharge: 2021-01-13 | Disposition: A | Discharge status: Home / Self Care | Payer: BC Managed Care – PPO | Attending: Emergency Medicine | Admitting: Emergency Medicine

## 2021-01-13 ENCOUNTER — Encounter (HOSPITAL_COMMUNITY): Payer: Self-pay

## 2021-01-13 ENCOUNTER — Other Ambulatory Visit: Payer: Self-pay

## 2021-01-13 DIAGNOSIS — Z23 Encounter for immunization: Secondary | ICD-10-CM

## 2021-01-13 DIAGNOSIS — S61411A Laceration without foreign body of right hand, initial encounter: Secondary | ICD-10-CM | POA: Diagnosis not present

## 2021-01-13 DIAGNOSIS — M79641 Pain in right hand: Secondary | ICD-10-CM

## 2021-01-13 DIAGNOSIS — S6991XA Unspecified injury of right wrist, hand and finger(s), initial encounter: Secondary | ICD-10-CM

## 2021-01-13 MED ORDER — TETANUS-DIPHTH-ACELL PERTUSSIS 5-2.5-18.5 LF-MCG/0.5 IM SUSY
0.5000 mL | PREFILLED_SYRINGE | Freq: Once | INTRAMUSCULAR | Status: AC
  Administered 2021-01-13: 0.5 mL via INTRAMUSCULAR

## 2021-01-13 MED ORDER — TETANUS-DIPHTH-ACELL PERTUSSIS 5-2.5-18.5 LF-MCG/0.5 IM SUSY
PREFILLED_SYRINGE | INTRAMUSCULAR | Status: AC
  Filled 2021-01-13: qty 0.5

## 2021-01-13 NOTE — ED Triage Notes (Signed)
Pt presents with a laceration to the right hand. Pt states he was using a butter knife as a tool and cut the palm of his hand.

## 2021-01-13 NOTE — ED Provider Notes (Signed)
[facility]    CSN: [id number] Arrival date & time: 01/13/21  0815      History   Chief Complaint Chief Complaint  Patient presents with  . Extremity Laceration    HPI [name] is a 50 y.o. male.   [name] is here with chief complaint of laceration to his right palm.  Reports cut his palm with a butter knife yesterday at approximately 7pm.  Bleeding controlled at this time.  Reports aching pain.  Has not tried any treatment at home.  Denies any significant aggravating or alleviating factors.  Denies any numbness, redness, streaking, or rashes. Denies any fevers, chest pain, shortness of breath, N/V/D, abdominal pain, or headaches.    ROS: As per HPI, all other pertinent ROS negative    The history is provided by the patient.  Laceration Location:  Hand Hand laceration location:  R palm Length:  1.5cm Quality: jagged   Bleeding: controlled   Time since incident:  12 hours Laceration mechanism:  Knife Foreign body present:  No foreign bodies Relieved by:  None tried Worsened by:  Nothing Ineffective treatments:  None tried Tetanus status:  Out of date   Past Medical History:  Diagnosis Date  . ADHD (attention deficit hyperactivity disorder)   . Asthma   . COPD (chronic obstructive pulmonary disease) (HCC)   . GERD (gastroesophageal reflux disease)   . Heart murmur    as a child     Patient Active Problem List   Diagnosis Date Noted  . Complete rotator cuff tear 02/14/2020  . History of arthroplasty of right shoulder 02/14/2020  . Pre-operative respiratory examination 01/31/2020  . COPD mixed type (HCC) 01/21/2020  . History of gout 10/12/2018  . Seasonal allergies 10/12/2018  . Gastroesophageal reflux disease 10/12/2018  . Arthritis 10/12/2018  . Moderate persistent asthma with acute exacerbation 10/12/2018  . Perforation of nasal septum 10/12/2018  . History of alcohol abuse 10/12/2018  . Former smoker 10/12/2018     Past Surgical History:  Procedure Laterality Date  . cervical fracture repair    . SHOULDER ARTHROSCOPY WITH ROTATOR CUFF REPAIR AND SUBACROMIAL DECOMPRESSION Right 02/14/2020   Procedure: SHOULDER ARTHROSCOPY WITH MINI OPEN ROTATOR CUFF REPAIR AND SUBACROMIAL DECOMPRESSION AND DEBRIDEMENT;  Surgeon: Jene Every, MD;  Location: [facility];  Service: Orthopedics;  Laterality: Right;  90 MINS BLOCK OK- NO EXPAREL       Home Medications    Prior to Admission medications   Medication Sig Start Date End Date Taking? Authorizing Provider  traMADol (ULTRAM) 50 MG tablet Take 50 mg by mouth 2 (two) times daily as needed for moderate pain.  01/21/20  Yes [provider]  albuterol (VENTOLIN HFA) 108 (90 Base) MCG/ACT inhaler INHALE 2 PUFFS INTO THE LUNGS EVERY 4 (FOUR) HOURS AS NEEDED FOR WHEEZING OR SHORTNESS OF BREATH. 08/21/20   Mannam, Praveen, MD  cyclobenzaprine (FLEXERIL) 10 MG tablet Take 1 tablet (10 mg total) by mouth 3 (three) times daily as needed for muscle spasms. 02/14/20   Dorothy Spark, PA-C  Fluticasone-Umeclidin-Vilant (TRELEGY ELLIPTA) 200-62.5-25 MCG/INH AEPB Inhale 1 puff into the lungs daily. 08/03/20   Glenford Bayley, NP  oxyCODONE-acetaminophen (PERCOCET) 5-325 MG tablet Take 1-2 tablets by mouth every 6 (six) hours as needed for severe pain. 02/14/20   Dorothy Spark, PA-C  predniSONE (DELTASONE) 10 MG tablet Take 4 tabs po daily x 2 days; then 3 tabs for 2 days; then 2 tabs for 2 days;  then 1 tab for 2 days 08/03/20   Glenford Bayley, NP  Harrel Carina ELLIPTA 200-62.5-25 MCG/INH AEPB TAKE 1 PUFF BY MOUTH EVERY DAY 12/28/20   Glenford Bayley, NP    Family History History reviewed. No pertinent family history.  Social History Social History   Tobacco Use  . Smoking status: Former Smoker    Packs/day: 1.00    Years: 37.00    Pack years: 37.00    Types: Cigarettes    Quit date: 2017    Years since quitting: 5.2  . Smokeless tobacco: Never Used   Vaping Use  . Vaping Use: Never used  Substance Use Topics  . Alcohol use: No  . Drug use: No     Allergies   Patient has no known allergies.   Review of Systems Review of Systems  Skin: Positive for wound.     Physical Exam Triage Vital Signs ED Triage Vitals  Enc Vitals Group     BP 01/13/21 0824 119/78     Pulse Rate 01/13/21 0824 90     Resp 01/13/21 0824 16     Temp 01/13/21 0824 98 F (36.7 C)     Temp Source 01/13/21 0824 Oral     SpO2 01/13/21 0824 96 %     Weight --      Height --      Head Circumference --      Peak Flow --      Pain Score 01/13/21 0823 4     Pain Loc --      Pain Edu? --      Excl. in GC? --    No data found.  Updated Vital Signs BP 119/78 (BP Location: Left Arm)   Pulse 90   Temp 98 F (36.7 C) (Oral)   Resp 16   SpO2 96%   Visual Acuity Right Eye Distance:   Left Eye Distance:   Bilateral Distance:    Right Eye Near:   Left Eye Near:    Bilateral Near:     Physical Exam Vitals and nursing note reviewed.  Constitutional:      General: He is not in acute distress.    Appearance: Normal appearance. He is not ill-appearing, toxic-appearing or diaphoretic.  HENT:     Head: Normocephalic and atraumatic.  Eyes:     Conjunctiva/sclera: Conjunctivae normal.  Cardiovascular:     Rate and Rhythm: Normal rate.     Pulses: Normal pulses.  Pulmonary:     Effort: Pulmonary effort is normal.  Abdominal:     General: Abdomen is flat.  Musculoskeletal:        General: Normal range of motion.     Cervical back: Normal range of motion.  Skin:    General: Skin is warm and dry.     Findings: Laceration (approximately 1.5cm laceration to right palm, bleeding controlled) and wound present.  Neurological:     General: No focal deficit present.     Mental Status: He is alert and oriented to person, place, and time.  Psychiatric:        Mood and Affect: Mood normal.        UC Treatments / Results  Labs (all labs ordered  are listed, but only abnormal results are displayed) Labs Reviewed - No data to display  EKG   Radiology No results found.  Procedures Laceration Repair  Date/Time: 01/13/2021 9:16 AM Performed by: Ivette Loyal, NP Authorized by: Ivette Loyal, NP  Consent:    Consent obtained:  Verbal   Consent given by:  Patient   Risks, benefits, and alternatives were discussed: yes     Risks discussed:  Infection and pain   Alternatives discussed:  No treatment Universal protocol:    Patient identity confirmed:  Verbally with patient and arm band Anesthesia:    Anesthesia method:  Local infiltration   Local anesthetic:  Lidocaine 1% w/o epi Laceration details:    Location:  Hand   Hand location:  R palm   Length (cm):  1.5   Depth (mm):  1 Treatment:    Area cleansed with:  Shur-Clens   Amount of cleaning:  Standard Skin repair:    Repair method:  Sutures   Suture size:  4-0   Suture material:  Prolene   Suture technique:  Horizontal mattress and simple interrupted   Number of sutures:  2 Approximation:    Approximation:  Close Repair type:    Repair type:  Simple Post-procedure details:    Dressing:  Non-adherent dressing   Procedure completion:  Tolerated well, no immediate complications Comments:     Full range of motion in hand after laceration repair    (including critical care time)  Medications Ordered in UC Medications  Tdap (BOOSTRIX) injection 0.5 mL (0.5 mLs Intramuscular Given 01/13/21 0913)    Initial Impression / Assessment and Plan / UC Course  I have reviewed the triage vital signs and the nursing notes.  Pertinent labs & imaging results that were available during my care of the patient were reviewed by me and considered in my medical decision making (see chart for details).     Laceration of right hand Pain of right hand Injury to right hand Assessment with no red flags concerning tendon involvement.  2 sutures placed in office Tetanus  updated in office Wound care and sign/symptoms of infection discussed Follow up for suture removal  Final Clinical Impressions(s) / UC Diagnoses   Final diagnoses:  Laceration of right hand without foreign body, initial encounter  Pain of right hand  Injury of right hand, initial encounter     Discharge Instructions     Keep your wound clean and dry.   You can clean it with warm soapy water and pat dry.  Do not rub.   Follow up in 10 days for sutures to be removed.    Return sooner if you notice any increased swelling, redness, red streaks, or yellow or green drainage.      ED Prescriptions    None     PDMP not reviewed this encounter.   Ivette Loyal, NP 01/13/21 [phone]

## 2021-01-13 NOTE — Discharge Instructions (Addendum)
Keep your wound clean and dry.   You can clean it with warm soapy water and pat dry.  Do not rub.   Follow up in 10 days for sutures to be removed.    Return sooner if you notice any increased swelling, redness, red streaks, or yellow or green drainage.

## 2021-06-02 ENCOUNTER — Other Ambulatory Visit: Payer: Self-pay | Admitting: Primary Care

## 2021-08-24 ENCOUNTER — Other Ambulatory Visit: Payer: Self-pay | Admitting: Primary Care

## 2021-08-30 ENCOUNTER — Other Ambulatory Visit: Payer: Self-pay | Admitting: Pulmonary Disease

## 2021-08-30 DIAGNOSIS — J4540 Moderate persistent asthma, uncomplicated: Secondary | ICD-10-CM

## 2021-08-30 DIAGNOSIS — J441 Chronic obstructive pulmonary disease with (acute) exacerbation: Secondary | ICD-10-CM

## 2021-09-29 ENCOUNTER — Other Ambulatory Visit: Payer: Self-pay | Admitting: Pulmonary Disease

## 2021-09-29 DIAGNOSIS — J441 Chronic obstructive pulmonary disease with (acute) exacerbation: Secondary | ICD-10-CM

## 2021-09-29 DIAGNOSIS — J4540 Moderate persistent asthma, uncomplicated: Secondary | ICD-10-CM

## 2021-10-15 ENCOUNTER — Other Ambulatory Visit: Payer: Self-pay | Admitting: Pulmonary Disease

## 2021-10-15 DIAGNOSIS — J4540 Moderate persistent asthma, uncomplicated: Secondary | ICD-10-CM

## 2021-10-15 DIAGNOSIS — J441 Chronic obstructive pulmonary disease with (acute) exacerbation: Secondary | ICD-10-CM

## 2022-03-25 ENCOUNTER — Ambulatory Visit: Payer: Self-pay | Admitting: Nurse Practitioner

## 2022-03-25 ENCOUNTER — Encounter: Payer: Self-pay | Admitting: Nurse Practitioner

## 2022-03-25 VITALS — BP 126/66 | HR 109 | Temp 98.2°F | Ht 66.0 in | Wt 172.6 lb

## 2022-03-25 DIAGNOSIS — J3489 Other specified disorders of nose and nasal sinuses: Secondary | ICD-10-CM

## 2022-03-25 DIAGNOSIS — J4489 Other specified chronic obstructive pulmonary disease: Secondary | ICD-10-CM | POA: Insufficient documentation

## 2022-03-25 DIAGNOSIS — J45901 Unspecified asthma with (acute) exacerbation: Secondary | ICD-10-CM

## 2022-03-25 DIAGNOSIS — J441 Chronic obstructive pulmonary disease with (acute) exacerbation: Secondary | ICD-10-CM | POA: Insufficient documentation

## 2022-03-25 DIAGNOSIS — J302 Other seasonal allergic rhinitis: Secondary | ICD-10-CM

## 2022-03-25 DIAGNOSIS — Z87891 Personal history of nicotine dependence: Secondary | ICD-10-CM

## 2022-03-25 DIAGNOSIS — K219 Gastro-esophageal reflux disease without esophagitis: Secondary | ICD-10-CM

## 2022-03-25 DIAGNOSIS — R002 Palpitations: Secondary | ICD-10-CM | POA: Insufficient documentation

## 2022-03-25 LAB — POCT EXHALED NITRIC OXIDE: FeNO level (ppb): 36

## 2022-03-25 MED ORDER — DOXYCYCLINE HYCLATE 100 MG PO TABS: 100.0000 mg | ORAL_TABLET | Freq: Two times a day (BID) | ORAL | 0 refills | Status: DC

## 2022-03-25 MED ORDER — PANTOPRAZOLE SODIUM 40 MG PO TBEC: 40.0000 mg | DELAYED_RELEASE_TABLET | Freq: Every day | ORAL | 1 refills | Status: DC

## 2022-03-25 MED ORDER — PREDNISONE 10 MG PO TABS: ORAL_TABLET | ORAL | 0 refills | Status: DC

## 2022-03-25 MED ORDER — LEVOCETIRIZINE DIHYDROCHLORIDE 5 MG PO TABS: 5.0000 mg | ORAL_TABLET | Freq: Every evening | ORAL | 5 refills | Status: DC

## 2022-03-25 MED ORDER — TRELEGY ELLIPTA 200-62.5-25 MCG/ACT IN AEPB: 1.0000 | INHALATION_SPRAY | Freq: Every day | RESPIRATORY_TRACT | 5 refills | Status: DC

## 2022-03-25 NOTE — Assessment & Plan Note (Signed)
Fullness with eating. Advised trial of protonix to see if this helps as this could be contributing to his chronic cough as well.

## 2022-03-25 NOTE — Patient Instructions (Addendum)
Restart Trelegy 1 puff daily. Brush tongue and rinse mouth afterwards Continue Albuterol inhaler 2 puffs every 6 hours as needed for shortness of breath or wheezing. Notify if symptoms persist despite rescue inhaler/neb use.  Doxycycline 1 tab Twice daily for 7 days. Take with food. Wear sunscreen while taking Prednisone taper. 4 tabs for 2 days, then 3 tabs for 2 days, 2 tabs for 2 days, then 1 tab for 2 days, then stop. Take in AM with food. Start tomorrow Xyzal 5 mg daily for allergies Protonix 40 mg daily for reflux  Labs - CBC with diff and allergen panel  Follow up with ENT as scheduled  Referral sent to cardiology.  Follow up in 2 weeks with Dr. Isaiah Serge or Philis Nettle. If symptoms do not improve or worsen, please contact office for sooner follow up or seek emergency care.

## 2022-03-25 NOTE — Assessment & Plan Note (Signed)
Suspect his nasal symptoms are multifactorial r/t allergies and structural deformities. Advised checking allergen panel and CBC with diff. Start Xyzal for trigger prevention. Will defer to ENT to manage any nasal sprays.

## 2022-03-25 NOTE — Assessment & Plan Note (Signed)
Regular rhythm today. No acute cardiac symptoms and not actively having palpitations. This is also not a new problem for him but has never been fully evaluated. States this primarily occurs with activity. Recommended referral to cardiology for further evaluation.

## 2022-03-25 NOTE — Assessment & Plan Note (Addendum)
AECOPD/asthma with elevated exhaled nitric oxide, likely from being without maintenance inhaler. Seems like his poor control over the last few months are possibly related to inconsistent use of Trelegy. Depo 80 mg inj x 1 today. Pred taper and doxy course. Continue PRN albuterol. Close follow up. If no improvement, recommended he come back sooner for further imaging. May be a biologic candidate if he remains poorly controlled.  Patient Instructions  Restart Trelegy 1 puff daily. Brush tongue and rinse mouth afterwards Continue Albuterol inhaler 2 puffs every 6 hours as needed for shortness of breath or wheezing. Notify if symptoms persist despite rescue inhaler/neb use.  Doxycycline 1 tab Twice daily for 7 days. Take with food. Wear sunscreen while taking Prednisone taper. 4 tabs for 2 days, then 3 tabs for 2 days, 2 tabs for 2 days, then 1 tab for 2 days, then stop. Take in AM with food. Start tomorrow Xyzal 5 mg daily for allergies Protonix 40 mg daily for reflux  Labs - CBC with diff and allergen panel  Follow up with ENT as scheduled  Referral sent to cardiology.  Follow up in 2 weeks with Dr. Vaughan Browner or Alanson Aly. If symptoms do not improve or worsen, please contact office for sooner follow up or seek emergency care.

## 2022-03-25 NOTE — Progress Notes (Addendum)
@Patient  ID: [name], male    DOB: Mar 09, 1971, 51 y.o.   MRN: [id number]  Chief Complaint  Patient presents with   Follow-up    Pt is here for shortness of breath. He states that he has ran out of medication. Last seen in 2021. Pt is wanting testing in regards to his COPD. Ex PFTs or stress test? He ran out of his medication Trelegy. He does have some Albuterol still.     Referring provider: 2022, MD  HPI: 51 year old male, former smoker followed for poorly controlled COPD/asthma.  He is a patient of Dr. 40 and last seen in office 08/03/2020 by 10/03/2020 NP.  Past medical history for perforated nasal septum, GERD, arthritis, seasonal allergies, former substance abuse.  TEST/EVENTS:  01/21/2020 CXR 2 view: Mild background emphysema without airspace disease, effusion or pneumothorax. 12/05/2020 PFTs: FVC 78, FEV1 52, ratio 52, TLC 116, DLCOcor 82.  Moderately severe obstructive airway disease with reversibility  08/03/2020: OV with 10/03/2020 NP.  Compliant with Trelegy but will occasionally be without prescription for 2 to 3 days before filling and notice a difference in his asthma symptoms.  Reports that his chest tightness and cough are typically worse in the morning.  Did have some wheezing at OV.  Cough is productive with clear mucus.  Experiencing 2-3 exacerbations on average in 6 months.  Stepped up to Trelegy 200 and added Singulair at bedtime.  Treated with prednisone taper.  Recommended that he follow-up in 6 months or sooner if no improvement.  03/25/2022: Today - follow up Patient presents today with girlfriend for overdue follow-up and is actually having acute symptoms as well.  He has had issues with his breathing over the last few months.  He has been trying to space out his Trelegy so he did not run out of it.  He has now been off of it for the past 3 to 4 weeks.  Has noticed a increase in shortness of breath, increase in productive cough and wheezing since.  He  also has significant allergy type symptoms and chronic nasal congestion.  He does have a known septal perforation and he is anticipated to see ENT later this month.  He has also been having issues with feeling very full after he eats and like things were sitting in his chest.  He can eat a very small meal and feels like he ate a for course dinner.  He also reports that he has had episodes where he feels like his heart is fluttering or racing.  Denies any chest pain. Denies any hemoptysis, recent weight loss, anorexia, lower extremity edema, recent fevers, night sweats.  He is not currently on any over-the-counter antihistamines and does not take anything for reflux.  FeNO 36 ppb  No Known Allergies  Immunization History  Administered Date(s) Administered   Influenza,inj,Quad PF,6+ Mos 07/05/2017, 09/01/2018   PFIZER(Purple Top)SARS-COV-2 Vaccination 12/21/2019, 01/11/2020   Tdap 01/13/2021    Past Medical History:  Diagnosis Date   ADHD (attention deficit hyperactivity disorder)    Asthma    COPD (chronic obstructive pulmonary disease) (HCC)    GERD (gastroesophageal reflux disease)    Heart murmur    as a child     Tobacco History: Social History   Tobacco Use  Smoking Status Former   Packs/day: 1.00   Years: 37.00   Total pack years: 37.00   Types: Cigarettes   Quit date: 2017   Years since quitting: 6.4  Smokeless Tobacco  Never   Counseling given: Not Answered   Outpatient Medications Prior to Visit  Medication Sig Dispense Refill   albuterol (VENTOLIN HFA) 108 (90 Base) MCG/ACT inhaler INHALE 2 PUFFS INTO THE LUNGS EVERY 4 (FOUR) HOURS AS NEEDED FOR WHEEZING OR SHORTNESS OF BREATH. 8.5 each 2   cyclobenzaprine (FLEXERIL) 10 MG tablet Take 1 tablet (10 mg total) by mouth 3 (three) times daily as needed for muscle spasms. 30 tablet 1   oxyCODONE-acetaminophen (PERCOCET) 5-325 MG tablet Take 1-2 tablets by mouth every 6 (six) hours as needed for severe pain. (Patient not  taking: Reported on 04/06/2022) 40 tablet 0   traMADol (ULTRAM) 50 MG tablet Take 50 mg by mouth 2 (two) times daily as needed for moderate pain.      Fluticasone-Umeclidin-Vilant (TRELEGY ELLIPTA) 200-62.5-25 MCG/INH AEPB Inhale 1 puff into the lungs daily. (Patient not taking: Reported on 03/25/2022) 14 each 0   predniSONE (DELTASONE) 10 MG tablet Take 4 tabs po daily x 2 days; then 3 tabs for 2 days; then 2 tabs for 2 days; then 1 tab for 2 days 20 tablet 0   TRELEGY ELLIPTA 200-62.5-25 MCG/ACT AEPB INHALE 1 PUFF BY MOUTH EVERY DAY (Patient not taking: Reported on 03/25/2022) 60 each 0   No facility-administered medications prior to visit.     Review of Systems:   Constitutional: No weight loss or gain, night sweats, fevers, chills, fatigue, or lassitude. HEENT: No headaches, difficulty swallowing, tooth/dental problems, or sore throat. No itching, ear ache +nasal congestion, sneezing, frequent nasal clearing CV:  + Palpitations/intermittent feeling of fluttering, usually with activity.  No chest pain, orthopnea, PND, swelling in lower extremities, anasarca, dizziness, syncope Resp: +shortness of breath with exertion; increased productive cough; wheezing.  No hemoptysis.  No chest wall deformity GI:  + Feeling of fullness after eating.  No abdominal pain, nausea, vomiting, diarrhea, change in bowel habits, loss of appetite, bloody stools.  Skin: No rash, lesions, ulcerations MSK:  No joint pain or swelling.  No decreased range of motion.  No back pain. Neuro: No dizziness or lightheadedness.  Psych: No depression or anxiety. Mood stable.     Physical Exam:  BP 126/66 (BP Location: Left Arm, Patient Position: Sitting, Cuff Size: Normal)   Pulse (!) 109   Temp 98.2 F (36.8 C) (Oral)   Ht 5\' 6"  (1.676 m)   Wt 172 lb 9.6 oz (78.3 kg)   SpO2 92%   BMI 27.86 kg/m   GEN: Pleasant, interactive, well-appearing; in no acute distress. HEENT:  Normocephalic and atraumatic. PERRLA. Sclera  white. Nasal turbinates boggy, moist.  Perforated septum.  No rhinorrhea present. Oropharynx pink and moist, without exudate or edema. No lesions, ulcerations, or postnasal drip.  NECK:  Supple w/ fair ROM. No JVD present. Normal carotid impulses w/o bruits. Thyroid symmetrical with no goiter or nodules palpated. No lymphadenopathy.   CV: RRR, no m/r/g, no peripheral edema. Pulses intact, +2 bilaterally. No cyanosis, pallor or clubbing. PULMONARY:  Unlabored, regular breathing.  Scattered wheeze bilaterally A&P. No accessory muscle use. No dullness to percussion. GI: BS present and normoactive. Soft, non-tender to palpation. No organomegaly or masses detected. No CVA tenderness. MSK: No erythema, warmth or tenderness. Cap refil <2 sec all extrem. No deformities or joint swelling noted.  Neuro: A/Ox3. No focal deficits noted.   Skin: Warm, no lesions or rashe Psych: Normal affect and behavior. Judgement and thought content appropriate.     Lab Results:  CBC    Component  Value Date/Time   WBC 8.5 04/07/2022 0924   WBC 11.8 (H) 03/28/2022 1142   RBC 4.54 04/07/2022 0924   RBC 4.30 03/28/2022 1142   HGB 13.9 04/07/2022 0924   HCT 40.2 04/07/2022 0924   PLT 268 04/07/2022 0924   MCV 89 04/07/2022 0924   MCH 30.6 04/07/2022 0924   MCH 31.3 02/13/2020 0813   MCHC 34.6 04/07/2022 0924   MCHC 33.7 03/28/2022 1142   RDW 13.2 04/07/2022 0924   LYMPHSABS 2.3 03/28/2022 1142   MONOABS 0.6 03/28/2022 1142   EOSABS 0.0 03/28/2022 1142   BASOSABS 0.0 03/28/2022 1142    BMET    Component Value Date/Time   NA 140 04/07/2022 0924   K 4.6 04/07/2022 0924   CL 103 04/07/2022 0924   CO2 23 04/07/2022 0924   GLUCOSE 98 04/07/2022 0924   GLUCOSE 98 02/13/2020 0813   BUN 22 04/07/2022 0924   CREATININE 0.93 04/07/2022 0924   CALCIUM 9.2 04/07/2022 0924   GFRNONAA >60 02/13/2020 0813   GFRAA >60 02/13/2020 0813    BNP No results found for: "BNP"   Imaging:  No results found.        Latest Ref Rng & Units 12/05/2018   11:42 AM  PFT Results  FVC-Pre L 3.10   FVC-Predicted Pre % 71   FVC-Post L 3.43   FVC-Predicted Post % 78   Pre FEV1/FVC % % 51   Post FEV1/FCV % % 52   FEV1-Pre L 1.57   FEV1-Predicted Pre % 46   FEV1-Post L 1.77   DLCO uncorrected ml/min/mmHg 21.01   DLCO UNC% % 82   DLCO corrected ml/min/mmHg 21.25   DLCO COR %Predicted % 82   DLVA Predicted % 83   TLC L 7.04   TLC % Predicted % 116   RV % Predicted % 206     Lab Results  Component Value Date   NITRICOXIDE 20 11/19/2018        Assessment & Plan:   Asthma exacerbation in COPD (HCC) AECOPD/asthma with elevated exhaled nitric oxide, likely from being without maintenance inhaler. Seems like his poor control over the last few months are possibly related to inconsistent use of Trelegy. Depo 80 mg inj x 1 today. Pred taper and doxy course. Continue PRN albuterol. Close follow up. If no improvement, recommended he come back sooner for further imaging. May be a biologic candidate if he remains poorly controlled.  Patient Instructions  Restart Trelegy 1 puff daily. Brush tongue and rinse mouth afterwards Continue Albuterol inhaler 2 puffs every 6 hours as needed for shortness of breath or wheezing. Notify if symptoms persist despite rescue inhaler/neb use.  Doxycycline 1 tab Twice daily for 7 days. Take with food. Wear sunscreen while taking Prednisone taper. 4 tabs for 2 days, then 3 tabs for 2 days, 2 tabs for 2 days, then 1 tab for 2 days, then stop. Take in AM with food. Start tomorrow Xyzal 5 mg daily for allergies Protonix 40 mg daily for reflux  Labs - CBC with diff and allergen panel  Follow up with ENT as scheduled  Referral sent to cardiology.  Follow up in 2 weeks with Dr. Isaiah SergeMannam or Philis NettleKatie Jason Frisbee,NP. If symptoms do not improve or worsen, please contact office for sooner follow up or seek emergency care.     Seasonal allergies Suspect his nasal symptoms are multifactorial  r/t allergies and structural deformities. Advised checking allergen panel and CBC with diff. Start Xyzal for trigger  prevention. Will defer to ENT to manage any nasal sprays.   Gastroesophageal reflux disease Fullness with eating. Advised trial of protonix to see if this helps as this could be contributing to his chronic cough as well.   Palpitations Regular rhythm today. No acute cardiac symptoms and not actively having palpitations. This is also not a new problem for him but has never been fully evaluated. States this primarily occurs with activity. Recommended referral to cardiology for further evaluation.  Perforation of nasal septum Seeing ENT on 6/21.   Former smoker Will discuss referral to lung cancer screening program at follow up.   I spent 45 minutes of dedicated to the care of this patient on the date of this encounter to include pre-visit review of records, face-to-face time with the patient discussing conditions above, post visit ordering of testing, clinical documentation with the electronic health record, making appropriate referrals as documented, and communicating necessary findings to members of the patients care team.  Noemi Chapel, NP 04/08/2022  Pt aware and understands NP's role.

## 2022-03-25 NOTE — Assessment & Plan Note (Signed)
Seeing ENT on 6/21.

## 2022-03-25 NOTE — Assessment & Plan Note (Signed)
Will discuss referral to lung cancer screening program at follow up.

## 2022-03-28 ENCOUNTER — Other Ambulatory Visit (INDEPENDENT_AMBULATORY_CARE_PROVIDER_SITE_OTHER): Payer: Self-pay

## 2022-03-28 DIAGNOSIS — J441 Chronic obstructive pulmonary disease with (acute) exacerbation: Secondary | ICD-10-CM

## 2022-03-28 DIAGNOSIS — J45901 Unspecified asthma with (acute) exacerbation: Secondary | ICD-10-CM

## 2022-03-28 LAB — CBC WITH DIFFERENTIAL/PLATELET
Basophils Absolute: 0 10*3/uL (ref 0.0–0.1)
Basophils Relative: 0.3 % (ref 0.0–3.0)
Eosinophils Absolute: 0 10*3/uL (ref 0.0–0.7)
Eosinophils Relative: 0.2 % (ref 0.0–5.0)
HCT: 39.5 % (ref 39.0–52.0)
Hemoglobin: 13.3 g/dL (ref 13.0–17.0)
Lymphocytes Relative: 19.4 % (ref 12.0–46.0)
Lymphs Abs: 2.3 10*3/uL (ref 0.7–4.0)
MCHC: 33.7 g/dL (ref 30.0–36.0)
MCV: 91.7 fl (ref 78.0–100.0)
Monocytes Absolute: 0.6 10*3/uL (ref 0.1–1.0)
Monocytes Relative: 5.1 % (ref 3.0–12.0)
Neutro Abs: 8.8 10*3/uL — ABNORMAL HIGH (ref 1.4–7.7)
Neutrophils Relative %: 75 % (ref 43.0–77.0)
Platelets: 302 10*3/uL (ref 150.0–400.0)
RBC: 4.3 Mil/uL (ref 4.22–5.81)
RDW: 15.1 % (ref 11.5–15.5)
WBC: 11.8 10*3/uL — ABNORMAL HIGH (ref 4.0–10.5)

## 2022-04-02 NOTE — Progress Notes (Unsigned)
Cardiology Office Note   Date:  04/06/2022   ID:  KRISTI HYER, DOB 01-31-1971, MRN [id number]  PCP:  Deeann Saint, MD  Cardiologist:   Ananda Sitzer [place], MD   Chief Complaint  Patient presents with   Palpitations      History of Present Illness: [name] is a 51 y.o. male who is seen at the request of Dr Salomon Fick for evaluation of palpitations. He has a history of COPD - severe by PFTs in 2020.   He reports that this year he has had increased SOB associated with tightness in his chest and sometimes feeling a weight in his chest. He also has experienced his heart racing fast. This has occurred with activity but even at normal levels of activity. Has some lightheadedness and dizziness with this but no syncope. States this has improved significantly since he has been back on therapy for his COPD with Trelegy. No prior cardiac evaluation. Remote murmur noted as a child.     Past Medical History:  Diagnosis Date   ADHD (attention deficit hyperactivity disorder)    Asthma    COPD (chronic obstructive pulmonary disease) (HCC)    GERD (gastroesophageal reflux disease)    Heart murmur    as a child     Past Surgical History:  Procedure Laterality Date   cervical fracture repair     SHOULDER ARTHROSCOPY WITH ROTATOR CUFF REPAIR AND SUBACROMIAL DECOMPRESSION Right 02/14/2020   Procedure: SHOULDER ARTHROSCOPY WITH MINI OPEN ROTATOR CUFF REPAIR AND SUBACROMIAL DECOMPRESSION AND DEBRIDEMENT;  Surgeon: Jene Every, MD;  Location: [facility];  Service: Orthopedics;  Laterality: Right;  90 MINS BLOCK OK- NO EXPAREL     Current Outpatient Medications  Medication Sig Dispense Refill   albuterol (VENTOLIN HFA) 108 (90 Base) MCG/ACT inhaler INHALE 2 PUFFS INTO THE LUNGS EVERY 4 (FOUR) HOURS AS NEEDED FOR WHEEZING OR SHORTNESS OF BREATH. 8.5 each 2   cyclobenzaprine (FLEXERIL) 10 MG tablet Take 1 tablet (10 mg total) by mouth 3 (three) times daily as needed for muscle spasms.  30 tablet 1   Fluticasone-Umeclidin-Vilant (TRELEGY ELLIPTA) 200-62.5-25 MCG/ACT AEPB Inhale 1 puff into the lungs daily. 1 each 5   levocetirizine (XYZAL) 5 MG tablet Take 1 tablet (5 mg total) by mouth every evening. 30 tablet 5   pantoprazole (PROTONIX) 40 MG tablet Take 1 tablet (40 mg total) by mouth daily. 30 tablet 1   No current facility-administered medications for this visit.    Allergies:   Patient has no known allergies.    Social History:  The patient  reports that he quit smoking about 6 years ago. His smoking use included cigarettes. He has a 37.00 pack-year smoking history. He has never used smokeless tobacco. He reports that he does not drink alcohol and does not use drugs.   Family History:  The patient's family history includes COPD in his mother.    ROS:  Please see the history of present illness.   Otherwise, review of systems are positive for none.   All other systems are reviewed and negative.    PHYSICAL EXAM: VS:  BP 122/79   Pulse 82   Ht 5\' 7"  (1.702 m)   Wt 171 lb 12.8 oz (77.9 kg)   SpO2 94%   BMI 26.91 kg/m  , BMI Body mass index is 26.91 kg/m. GEN: Well nourished, well developed, in no acute distress HEENT: normal Neck: no JVD, carotid bruits, or masses Cardiac: RRR; no murmurs, rubs,  or gallops,no edema  Respiratory:  clear to auscultation bilaterally, normal work of breathing GI: soft, nontender, nondistended, + BS MS: no deformity or atrophy Skin: warm and dry, no rash Neuro:  Strength and sensation are intact Psych: euthymic mood, full affect   EKG:  EKG is ordered today. The ekg ordered today demonstrates NSR with RBBB. Rate 93. Unchanged from 2019. I have personally reviewed and interpreted this study.    Recent Labs: 03/28/2022: Hemoglobin 13.3; Platelets 302.0    Lipid Panel No results found for: "CHOL", "TRIG", "HDL", "CHOLHDL", "VLDL", "LDLCALC", "LDLDIRECT"    Wt Readings from Last 3 Encounters:  04/06/22 171 lb 12.8 oz  (77.9 kg)  03/25/22 172 lb 9.6 oz (78.3 kg)  08/03/20 163 lb (73.9 kg)      Other studies Reviewed: Additional studies/ records that were reviewed today include: none. Review of the above records demonstrates: N/A   ASSESSMENT AND PLAN:  1.  Chest tightness and pressure. I suspect this is more related to his COPD. Symptoms are much better on Trelegy. I would like to assess LV function with Echo. It is certainly possible some of his symptoms could be anginal equivalent. Options for ischemic work up are limited. He would not perform well on treadmill testing. With severe COPD not a candidate for Lexiscan. Also would not be able to give beta blocker to do coronary CTA. Only options would be to do dobutamine stress or consider cardiac cath. Will check Echo and see how he progresses with inhaler therapy. 2. Tachypalpitations. Will check 2 week event monitor -Zio patch 3. Will also assess baseline labs including CBC, CMET, TSH, magnesium and lipid panel.    Current medicines are reviewed at length with the patient today.  The patient does not have concerns regarding medicines.  The following changes have been made:  no change  Labs/ tests ordered today include:   Orders Placed This Encounter  Procedures   CBC   Comprehensive metabolic panel   Lipid panel   TSH   Magnesium   LONG TERM MONITOR (3-14 DAYS)   EKG 12-Lead   ECHOCARDIOGRAM COMPLETE         Disposition:   FU with me after above studies.   Signed, Shelbylynn Walczyk [place], MD  04/06/2022 4:57 PM    [facility] [address], [place], [place], [address] Phone [phone], Fax [phone]

## 2022-04-06 ENCOUNTER — Ambulatory Visit (INDEPENDENT_AMBULATORY_CARE_PROVIDER_SITE_OTHER): Payer: BC Managed Care – PPO | Admitting: Cardiology

## 2022-04-06 ENCOUNTER — Ambulatory Visit (INDEPENDENT_AMBULATORY_CARE_PROVIDER_SITE_OTHER): Payer: BC Managed Care – PPO

## 2022-04-06 ENCOUNTER — Encounter: Payer: Self-pay | Admitting: Cardiology

## 2022-04-06 VITALS — BP 122/79 | HR 82 | Ht 67.0 in | Wt 171.8 lb

## 2022-04-06 DIAGNOSIS — R0609 Other forms of dyspnea: Secondary | ICD-10-CM

## 2022-04-06 DIAGNOSIS — R002 Palpitations: Secondary | ICD-10-CM | POA: Diagnosis not present

## 2022-04-06 DIAGNOSIS — J449 Chronic obstructive pulmonary disease, unspecified: Secondary | ICD-10-CM

## 2022-04-06 NOTE — Patient Instructions (Addendum)
Medication Instructions:  No changes   *If you need a refill on your cardiac medications before your next appointment, please call your pharmacy*   Lab Work: FASTING Lipid CMP CBC TSH Magnesium  If you have labs (blood work) drawn today and your tests are completely normal, you will receive your results only by: MyChart Message (if you have MyChart) OR A paper copy in the mail If you have any lab test that is abnormal or we need to change your treatment, we will call you to review the results.   Testing/Procedures: Will be schedule at [address]  Your physician has requested that you have an echocardiogram. Echocardiography is a painless test that uses sound waves to create images of your heart. It provides your doctor with information about the size and shape of your heart and how well your heart's chambers and valves are working. This procedure takes approximately one hour. There are no restrictions for this procedure.  And Will be mailed to your home Your physician has recommended that you wear a holter monitor 14 day . Holter monitors are medical devices that record the heart's electrical activity. Doctors most often use these monitors to diagnose arrhythmias. Arrhythmias are problems with the speed or rhythm of the heartbeat. The monitor is a small, portable device. You can wear one while you do your normal daily activities. This is usually used to diagnose what is causing palpitations/syncope (passing out).  Follow-Up: At [facility], you and your health needs are our priority.  As part of our continuing mission to provide you with exceptional heart care, we have created designated Provider Care Teams.  These Care Teams include your primary Cardiologist (physician) and Advanced Practice Providers (APPs -  Physician Assistants and Nurse Practitioners) who all work together to provide you with the care you need, when you need it.     Your next  appointment:   2 month(s)  The format for your next appointment:   In Person  Provider:    Dr Peter [place]  Other Instructions   ZIO XT- Long Term Monitor Instructions  Your physician has requested you wear a ZIO patch monitor for 14 days.  This is a single patch monitor. [employer] supplies one patch monitor per enrollment. Additional stickers are not available. Please do not apply patch if you will be having a Nuclear Stress Test,  Echocardiogram, Cardiac CT, MRI, or Chest Xray during the period you would be wearing the  monitor. The patch cannot be worn during these tests. You cannot remove and re-apply the  ZIO XT patch monitor.  Your ZIO patch monitor will be mailed 3 day USPS to your address on file. It may take 3-5 days  to receive your monitor after you have been enrolled.  Once you have received your monitor, please review the enclosed instructions. Your monitor  has already been registered assigning a specific monitor serial # to you.  Billing and Patient Assistance Program Information  We have supplied [employer] with any of your insurance information on file for billing purposes. [employer] offers a sliding scale Patient Assistance Program for patients that do not have  insurance, or whose insurance does not completely cover the cost of the ZIO monitor.  You must apply for the Patient Assistance Program to qualify for this discounted rate.  To apply, please call [employer] at [phone], select option 4, select option 2, ask to apply for  Patient Assistance Program. Meredeth Ide will ask your household  income, and how many people  are in your household. They will quote your out-of-pocket cost based on that information.  [employer] will also be able to set up a 25-month, interest-free payment plan if needed.  Applying the monitor   Shave hair from upper left chest.  Hold abrader disc by orange tab. Rub abrader in 40 strokes over the upper left chest as  indicated in your monitor  instructions.  Clean area with 4 enclosed alcohol pads. Let dry.  Apply patch as indicated in monitor instructions. Patch will be placed under collarbone on left  side of chest with arrow pointing upward.  Rub patch adhesive wings for 2 minutes. Remove white label marked "1". Remove the white  label marked "2". Rub patch adhesive wings for 2 additional minutes.  While looking in a mirror, press and release button in center of patch. A small green light will  flash 3-4 times. This will be your only indicator that the monitor has been turned on.  Do not shower for the first 24 hours. You may shower after the first 24 hours.  Press the button if you feel a symptom. You will hear a small click. Record Date, Time and  Symptom in the Patient Logbook.  When you are ready to remove the patch, follow instructions on the last 2 pages of Patient  Logbook. Stick patch monitor onto the last page of Patient Logbook.  Place Patient Logbook in the blue and white box. Use locking tab on box and tape box closed  securely. The blue and white box has prepaid postage on it. Please place it in the mailbox as  soon as possible. Your physician should have your test results approximately 7 days after the  monitor has been mailed back to [facility].  Call [facility] Customer Care at [phone] if you have questions regarding  your ZIO XT patch monitor. Call them immediately if you see an orange light blinking on your  monitor.  If your monitor falls off in less than 4 days, contact our Monitor department at [phone].  If your monitor becomes loose or falls off after 4 days call [employer] at [phone] for  suggestions on securing your monitor

## 2022-04-06 NOTE — Progress Notes (Unsigned)
Enrolled patient for a 14 day Zio XT  monitor to be mailed to patients home  °

## 2022-04-07 DIAGNOSIS — J449 Chronic obstructive pulmonary disease, unspecified: Secondary | ICD-10-CM | POA: Diagnosis not present

## 2022-04-07 DIAGNOSIS — R0609 Other forms of dyspnea: Secondary | ICD-10-CM | POA: Diagnosis not present

## 2022-04-07 DIAGNOSIS — R002 Palpitations: Secondary | ICD-10-CM | POA: Diagnosis not present

## 2022-04-07 LAB — COMPREHENSIVE METABOLIC PANEL
ALT: 29 IU/L (ref 0–44)
AST: 23 IU/L (ref 0–40)
Albumin/Globulin Ratio: 2 (ref 1.2–2.2)
Albumin: 4.3 g/dL (ref 3.8–4.9)
Alkaline Phosphatase: 62 IU/L (ref 44–121)
BUN/Creatinine Ratio: 24 — ABNORMAL HIGH (ref 9–20)
BUN: 22 mg/dL (ref 6–24)
Bilirubin Total: 0.2 mg/dL (ref 0.0–1.2)
CO2: 23 mmol/L (ref 20–29)
Calcium: 9.2 mg/dL (ref 8.7–10.2)
Chloride: 103 mmol/L (ref 96–106)
Creatinine, Ser: 0.93 mg/dL (ref 0.76–1.27)
Globulin, Total: 2.2 g/dL (ref 1.5–4.5)
Glucose: 98 mg/dL (ref 70–99)
Potassium: 4.6 mmol/L (ref 3.5–5.2)
Sodium: 140 mmol/L (ref 134–144)
Total Protein: 6.5 g/dL (ref 6.0–8.5)
eGFR: 98 mL/min/{1.73_m2} (ref >59)

## 2022-04-07 LAB — CBC
Hematocrit: 40.2 % (ref 37.5–51.0)
Hemoglobin: 13.9 g/dL (ref 13.0–17.7)
MCH: 30.6 pg (ref 26.6–33.0)
MCHC: 34.6 g/dL (ref 31.5–35.7)
MCV: 89 fL (ref 79–97)
Platelets: 268 10*3/uL (ref 150–450)
RBC: 4.54 x10E6/uL (ref 4.14–5.80)
RDW: 13.2 % (ref 11.6–15.4)
WBC: 8.5 10*3/uL (ref 3.4–10.8)

## 2022-04-07 LAB — LIPID PANEL
Chol/HDL Ratio: 5.2 ratio — ABNORMAL HIGH (ref 0.0–5.0)
Cholesterol, Total: 220 mg/dL — ABNORMAL HIGH (ref 100–199)
HDL: 42 mg/dL (ref >39)
LDL Chol Calc (NIH): 163 mg/dL — ABNORMAL HIGH (ref 0–99)
Triglycerides: 82 mg/dL (ref 0–149)
VLDL Cholesterol Cal: 15 mg/dL (ref 5–40)

## 2022-04-07 LAB — MAGNESIUM: Magnesium: 2.1 mg/dL (ref 1.6–2.3)

## 2022-04-07 LAB — TSH: TSH: 1.2 u[IU]/mL (ref 0.450–4.500)

## 2022-04-08 ENCOUNTER — Ambulatory Visit: Payer: BC Managed Care – PPO | Admitting: Nurse Practitioner

## 2022-04-08 ENCOUNTER — Telehealth: Payer: Self-pay | Admitting: *Deleted

## 2022-04-08 ENCOUNTER — Encounter: Payer: Self-pay | Admitting: Nurse Practitioner

## 2022-04-08 VITALS — BP 126/70 | HR 91 | Temp 98.1°F | Ht 66.0 in | Wt 173.8 lb

## 2022-04-08 DIAGNOSIS — E785 Hyperlipidemia, unspecified: Secondary | ICD-10-CM

## 2022-04-08 DIAGNOSIS — K219 Gastro-esophageal reflux disease without esophagitis: Secondary | ICD-10-CM

## 2022-04-08 DIAGNOSIS — J302 Other seasonal allergic rhinitis: Secondary | ICD-10-CM | POA: Diagnosis not present

## 2022-04-08 DIAGNOSIS — J3489 Other specified disorders of nose and nasal sinuses: Secondary | ICD-10-CM

## 2022-04-08 DIAGNOSIS — R002 Palpitations: Secondary | ICD-10-CM

## 2022-04-08 DIAGNOSIS — J449 Chronic obstructive pulmonary disease, unspecified: Secondary | ICD-10-CM | POA: Diagnosis not present

## 2022-04-08 MED ORDER — ROSUVASTATIN CALCIUM 20 MG PO TABS: 20.0000 mg | ORAL_TABLET | Freq: Every day | ORAL | 3 refills | Status: DC

## 2022-04-08 NOTE — Telephone Encounter (Signed)
-----   Message from Peter M [place], MD sent at 04/08/2022  7:11 AM EDT ----- The following abnormalities are noted:  all labs look good except for cholesterol which is high.  All other values are normal, stable or within acceptable limits. Medication changes / Follow up labs / Other changes or recommendations:   I would recommend he go on statin therapy. Crestor 20 mg daily. Check LFTs and lipids again in 3 months  Peter [place], MD 04/08/2022 7:10 AM

## 2022-04-08 NOTE — Telephone Encounter (Signed)
pt aware of results  New script sent to the pharmacy  Lab orders mailed to the pt  

## 2022-04-08 NOTE — Assessment & Plan Note (Signed)
Cardiac event monitor x 2 weeks. Plans for echo. Palpitations have improved with better control of his COPD/asthma; however, not entirely resolved.  Follow up with cardiology as scheduled.

## 2022-04-08 NOTE — Assessment & Plan Note (Addendum)
Draw previously ordered allergen panel today. Advised starting on Xyzal as needed for allergies. Discussed that he could rotate otc antihistamines every few months if needed.

## 2022-04-08 NOTE — Assessment & Plan Note (Signed)
Follow up with ENT as scheduled

## 2022-04-08 NOTE — Progress Notes (Signed)
@Patient  ID: [name], male    DOB: 05-30-1971, 51 y.o.   MRN: [id number]  Chief Complaint  Patient presents with   Follow-up    Follow up. Patient has no complaints.     Referring provider: [id number], MD  HPI: 51 year old male, former smoker followed for poorly controlled COPD/asthma.  He is a patient of Dr. 40 and last seen in office 03/25/2022 by [facility] NP.  Past medical history for perforated nasal septum, GERD, arthritis, seasonal allergies, former substance abuse.  TEST/EVENTS:  01/21/2020 CXR 2 view: Mild background emphysema without airspace disease, effusion or pneumothorax. 12/05/2020 PFTs: FVC 78, FEV1 52, ratio 52, TLC 116, DLCOcor 82.  Moderately severe obstructive airway disease with reversibility  08/03/2020: OV with 10/03/2020 NP.  Compliant with Trelegy but will occasionally be without prescription for 2 to 3 days before filling and notice a difference in his asthma symptoms.  Reports that his chest tightness and cough are typically worse in the morning.  Did have some wheezing at OV.  Cough is productive with clear mucus.  Experiencing 2-3 exacerbations on average in 6 months.  Stepped up to Trelegy 200 and added Singulair at bedtime.  Treated with prednisone taper.  Recommended that he follow-up in 6 months or sooner if no improvement.  03/25/2022: OV with Jaquise Faux NP for overdue follow-up and is actually having acute symptoms as well.  He has had issues with his breathing over the last few months.  He has been trying to space out his Trelegy so he did not run out of it.  He has now been off of it for the past 3 to 4 weeks.  Has noticed a increase in shortness of breath, increase in productive cough and wheezing since.  He also has significant allergy type symptoms and chronic nasal congestion.  He does have a known septal perforation and he is anticipated to see ENT later this month.  He has also been having issues with feeling very full after he eats and like things  were sitting in his chest.  He can eat a very small meal and feels like he ate a four course dinner.  He also reports that he has had episodes where he feels like his heart is fluttering or racing. FeNO elevated to 36. Poor control seemed to be related to inconsistent use of Trelegy; does much better when he is on it. Treated with depo, pred taper and empiric doxy course for AECOPD/asthma. Restart trelegy. Started him on Xyzal for allergies and protonix for GERD. Check CBC with diff - eos 0 and allergen panel - never drawn. Referred to cardiology d/t palpitations.   04/08/2022: Today - follow up Patient presents today for follow-up after being treated for AECOPD/asthma.  He reports feeling significantly better after prednisone and Doxy courses last week, and restarting his Trelegy inhaler.  His cough has mostly resolved and he has not noticed any more wheezing.  Activity tolerance has significantly improved.  He is able to climb stairs without difficulties.  He did see cardiology and is going to be wearing a cardiac monitor for 2 weeks and undergo echocardiogram for further evaluation of his palpitations.  He never started on Xyzal and allergen panel was never drawn after last visit.  Feels like allergies are okay and have improved some since her last visit.  He did start on Protonix and feels like this is helped with his GERD symptoms.  No concerns or complaints today.  No Known Allergies  Immunization History  Administered Date(s) Administered   Influenza,inj,Quad PF,6+ Mos 07/05/2017, 09/01/2018   PFIZER(Purple Top)SARS-COV-2 Vaccination 12/21/2019, 01/11/2020   Tdap 01/13/2021    Past Medical History:  Diagnosis Date   ADHD (attention deficit hyperactivity disorder)    Asthma    COPD (chronic obstructive pulmonary disease) (HCC)    GERD (gastroesophageal reflux disease)    Heart murmur    as a child     Tobacco History: Social History   Tobacco Use  Smoking Status Former   Packs/day:  1.00   Years: 37.00   Total pack years: 37.00   Types: Cigarettes   Quit date: 2017   Years since quitting: 6.4  Smokeless Tobacco Never   Counseling given: Not Answered   Outpatient Medications Prior to Visit  Medication Sig Dispense Refill   albuterol (VENTOLIN HFA) 108 (90 Base) MCG/ACT inhaler INHALE 2 PUFFS INTO THE LUNGS EVERY 4 (FOUR) HOURS AS NEEDED FOR WHEEZING OR SHORTNESS OF BREATH. 8.5 each 2   cyclobenzaprine (FLEXERIL) 10 MG tablet Take 1 tablet (10 mg total) by mouth 3 (three) times daily as needed for muscle spasms. 30 tablet 1   Fluticasone-Umeclidin-Vilant (TRELEGY ELLIPTA) 200-62.5-25 MCG/ACT AEPB Inhale 1 puff into the lungs daily. 1 each 5   levocetirizine (XYZAL) 5 MG tablet Take 1 tablet (5 mg total) by mouth every evening. 30 tablet 5   pantoprazole (PROTONIX) 40 MG tablet Take 1 tablet (40 mg total) by mouth daily. 30 tablet 1   No facility-administered medications prior to visit.     Review of Systems:   Constitutional: No weight loss or gain, night sweats, fevers, chills, fatigue, or lassitude. HEENT: No headaches, difficulty swallowing, tooth/dental problems, or sore throat. No itching, ear ache +nasal congestion, sneezing, frequent nasal clearing (improved) CV:  + Palpitations/intermittent feeling of fluttering, usually with activity.  No chest pain, orthopnea, PND, swelling in lower extremities, anasarca, dizziness, syncope Resp: No shortness of breath. No wheezing. No cough. No hemoptysis.  No chest wall deformity GI:   No heartburn, indigestion, abdominal pain, nausea, vomiting, diarrhea, change in bowel habits, loss of appetite, bloody stools.  Skin: No rash, lesions, ulcerations MSK:  No joint pain or swelling.  No decreased range of motion.  No back pain. Neuro: No dizziness or lightheadedness.  Psych: No depression or anxiety. Mood stable.     Physical Exam:  BP 126/70 (BP Location: Right Arm, Patient Position: Sitting, Cuff Size: Normal)    Pulse 91   Temp 98.1 F (36.7 C) (Oral)   Ht 5\' 6"  (1.676 m)   Wt 173 lb 12.8 oz (78.8 kg)   SpO2 92%   BMI 28.05 kg/m   GEN: Pleasant, interactive, well-appearing; in no acute distress. HEENT:  Normocephalic and atraumatic. PERRLA. Sclera white. Nasal turbinates boggy, moist.  Perforated septum.  No rhinorrhea present. Oropharynx pink and moist, without exudate or edema. No lesions, ulcerations, or postnasal drip.  NECK:  Supple w/ fair ROM. No JVD present. Normal carotid impulses w/o bruits. Thyroid symmetrical with no goiter or nodules palpated. No lymphadenopathy.   CV: RRR, no m/r/g, no peripheral edema. Pulses intact, +2 bilaterally. No cyanosis, pallor or clubbing. PULMONARY:  Unlabored, regular breathing.  Clear bilaterally A&P w/o wheezes/rales/rhonchi. No accessory muscle use. No dullness to percussion. GI: BS present and normoactive. Soft, non-tender to palpation. No organomegaly or masses detected. No CVA tenderness. MSK: No erythema, warmth or tenderness. Cap refil <2 sec all extrem. No deformities or joint swelling noted.  Neuro: A/Ox3.  No focal deficits noted.   Skin: Warm, no lesions or rashe Psych: Normal affect and behavior. Judgement and thought content appropriate.     Lab Results:  CBC    Component Value Date/Time   WBC 8.5 04/07/2022 0924   WBC 11.8 (H) 03/28/2022 1142   RBC 4.54 04/07/2022 0924   RBC 4.30 03/28/2022 1142   HGB 13.9 04/07/2022 0924   HCT 40.2 04/07/2022 0924   PLT 268 04/07/2022 0924   MCV 89 04/07/2022 0924   MCH 30.6 04/07/2022 0924   MCH 31.3 02/13/2020 0813   MCHC 34.6 04/07/2022 0924   MCHC 33.7 03/28/2022 1142   RDW 13.2 04/07/2022 0924   LYMPHSABS 2.3 03/28/2022 1142   MONOABS 0.6 03/28/2022 1142   EOSABS 0.0 03/28/2022 1142   BASOSABS 0.0 03/28/2022 1142    BMET    Component Value Date/Time   NA 140 04/07/2022 0924   K 4.6 04/07/2022 0924   CL 103 04/07/2022 0924   CO2 23 04/07/2022 0924   GLUCOSE 98 04/07/2022 0924    GLUCOSE 98 02/13/2020 0813   BUN 22 04/07/2022 0924   CREATININE 0.93 04/07/2022 0924   CALCIUM 9.2 04/07/2022 0924   GFRNONAA >60 02/13/2020 0813   GFRAA >60 02/13/2020 0813    BNP No results found for: "BNP"   Imaging:  No results found.       Latest Ref Rng & Units 12/05/2018   11:42 AM  PFT Results  FVC-Pre L 3.10   FVC-Predicted Pre % 71   FVC-Post L 3.43   FVC-Predicted Post % 78   Pre FEV1/FVC % % 51   Post FEV1/FCV % % 52   FEV1-Pre L 1.57   FEV1-Predicted Pre % 46   FEV1-Post L 1.77   DLCO uncorrected ml/min/mmHg 21.01   DLCO UNC% % 82   DLCO corrected ml/min/mmHg 21.25   DLCO COR %Predicted % 82   DLVA Predicted % 83   TLC L 7.04   TLC % Predicted % 116   RV % Predicted % 206     Lab Results  Component Value Date   NITRICOXIDE 20 11/19/2018        Assessment & Plan:   COPD mixed type (HCC) Resolved exacerbation. Clinically improved since our last visit. Continue triple therapy regimen with Trelegy and PRN albuterol.  Patient Instructions  Restart Trelegy 1 puff daily. Brush tongue and rinse mouth afterwards Continue Albuterol inhaler 2 puffs every 6 hours as needed for shortness of breath or wheezing. Notify if symptoms persist despite rescue inhaler/neb use.   Xyzal 5 mg daily for allergies Protonix 40 mg daily for reflux   Labs - allergen panel  Follow up in 3 months with Dr. Isaiah Serge or Katie Shaneece Stockburger,NP. If symptoms do not improve or worsen, please contact office for sooner follow up or seek emergency care.     Seasonal allergies Draw previously ordered allergen panel today. Advised starting on Xyzal as needed for allergies. Discussed that he could rotate otc antihistamines every few months if needed.  Perforation of nasal septum Follow up with ENT as scheduled.  Gastroesophageal reflux disease Well-controlled on current regimen. Recommended he use protonix for a minimum of 8 weeks then he can change to PRN if  tolerated.  Palpitations Cardiac event monitor x 2 weeks. Plans for echo. Palpitations have improved with better control of his COPD/asthma; however, not entirely resolved.  Follow up with cardiology as scheduled.     I spent 28 minutes of dedicated  to the care of this patient on the date of this encounter to include pre-visit review of records, face-to-face time with the patient discussing conditions above, post visit ordering of testing, clinical documentation with the electronic health record, making appropriate referrals as documented, and communicating necessary findings to members of the patients care team.  Noemi Chapel, NP 04/08/2022  Pt aware and understands NP's role.

## 2022-04-08 NOTE — Patient Instructions (Addendum)
Restart Trelegy 1 puff daily. Brush tongue and rinse mouth afterwards Continue Albuterol inhaler 2 puffs every 6 hours as needed for shortness of breath or wheezing. Notify if symptoms persist despite rescue inhaler/neb use.   Xyzal 5 mg daily for allergies Protonix 40 mg daily for reflux   Labs - allergen panel  Follow up in 3 months with Dr. Isaiah Serge or Katie Nariya Neumeyer,NP. If symptoms do not improve or worsen, please contact office for sooner follow up or seek emergency care.

## 2022-04-08 NOTE — Assessment & Plan Note (Signed)
Well-controlled on current regimen. Recommended he use protonix for a minimum of 8 weeks then he can change to PRN if tolerated.

## 2022-04-08 NOTE — Assessment & Plan Note (Signed)
Resolved exacerbation. Clinically improved since our last visit. Continue triple therapy regimen with Trelegy and PRN albuterol.  Patient Instructions  Restart Trelegy 1 puff daily. Brush tongue and rinse mouth afterwards Continue Albuterol inhaler 2 puffs every 6 hours as needed for shortness of breath or wheezing. Notify if symptoms persist despite rescue inhaler/neb use.   Xyzal 5 mg daily for allergies Protonix 40 mg daily for reflux   Labs - allergen panel  Follow up in 3 months with Dr. Isaiah Serge or Katie Dequarius Jeffries,NP. If symptoms do not improve or worsen, please contact office for sooner follow up or seek emergency care.

## 2022-04-10 DIAGNOSIS — R0609 Other forms of dyspnea: Secondary | ICD-10-CM

## 2022-04-10 DIAGNOSIS — J449 Chronic obstructive pulmonary disease, unspecified: Secondary | ICD-10-CM | POA: Diagnosis not present

## 2022-04-10 DIAGNOSIS — R002 Palpitations: Secondary | ICD-10-CM

## 2022-04-12 LAB — ALLERGEN PANEL (27) + IGE
Alternaria Alternata IgE: <0.1 kU/L
Aspergillus Fumigatus IgE: <0.1 kU/L
Bahia Grass IgE: 15.8 kU/L — AB
Bermuda Grass IgE: 8.97 kU/L — AB
Cat Dander IgE: <0.1 kU/L
Cedar, Mountain IgE: 0.59 kU/L — AB
Cladosporium Herbarum IgE: <0.1 kU/L
Cocklebur IgE: 0.65 kU/L — AB
Cockroach, American IgE: <0.1 kU/L
Common Silver Birch IgE: 2.94 kU/L — AB
D Farinae IgE: <0.1 kU/L
D Pteronyssinus IgE: <0.1 kU/L
Dog Dander IgE: 0.15 kU/L — AB
Elm, American IgE: 3.06 kU/L — AB
Hickory, White IgE: 2.46 kU/L — AB
IgE (Immunoglobulin E), Serum: 104 IU/mL (ref 6–495)
Johnson Grass IgE: 7.12 kU/L — AB
Kentucky Bluegrass IgE: 24.3 kU/L — AB
Maple/Box Elder IgE: 3.14 kU/L — AB
Mucor Racemosus IgE: <0.1 kU/L
Oak, White IgE: 1.91 kU/L — AB
Penicillium Chrysogen IgE: <0.1 kU/L
Pigweed, Rough IgE: 1.54 kU/L — AB
Plantain, English IgE: 3.39 kU/L — AB
Ragweed, Short IgE: 2.29 kU/L — AB
Setomelanomma Rostrat: <0.1 kU/L
Timothy Grass IgE: 12.7 kU/L — AB
White Mulberry IgE: 0.13 kU/L — AB

## 2022-04-12 NOTE — Progress Notes (Signed)
Please notify patient that his allergen panel came back. He has multiple environmental allergies, mostly to trees/plants. He has a very low allergy response to dog dander as well. Would he like me to go ahead and send a referral to an allergist or does he want to see how he does with our current regimen? Thanks!

## 2022-04-16 ENCOUNTER — Other Ambulatory Visit: Payer: Self-pay | Admitting: Nurse Practitioner

## 2022-04-16 DIAGNOSIS — K219 Gastro-esophageal reflux disease without esophagitis: Secondary | ICD-10-CM

## 2022-04-18 ENCOUNTER — Other Ambulatory Visit: Payer: Self-pay | Admitting: Nurse Practitioner

## 2022-04-18 DIAGNOSIS — K219 Gastro-esophageal reflux disease without esophagitis: Secondary | ICD-10-CM

## 2022-04-27 ENCOUNTER — Ambulatory Visit (HOSPITAL_COMMUNITY): Payer: BC Managed Care – PPO | Attending: Cardiology

## 2022-04-27 DIAGNOSIS — J449 Chronic obstructive pulmonary disease, unspecified: Secondary | ICD-10-CM | POA: Diagnosis not present

## 2022-04-27 DIAGNOSIS — R0609 Other forms of dyspnea: Secondary | ICD-10-CM | POA: Diagnosis not present

## 2022-04-27 DIAGNOSIS — R002 Palpitations: Secondary | ICD-10-CM | POA: Diagnosis not present

## 2022-04-27 LAB — ECHOCARDIOGRAM COMPLETE
Area-P 1/2: 3.94 cm2
S' Lateral: 2.8 cm

## 2022-06-03 DIAGNOSIS — J342 Deviated nasal septum: Secondary | ICD-10-CM | POA: Diagnosis not present

## 2022-06-03 DIAGNOSIS — J3489 Other specified disorders of nose and nasal sinuses: Secondary | ICD-10-CM | POA: Diagnosis not present

## 2022-06-03 DIAGNOSIS — R519 Headache, unspecified: Secondary | ICD-10-CM | POA: Diagnosis not present

## 2022-06-03 DIAGNOSIS — J310 Chronic rhinitis: Secondary | ICD-10-CM | POA: Diagnosis not present

## 2022-06-05 NOTE — Progress Notes (Unsigned)
Cardiology Office Note   Date:  06/09/2022   ID:  [name], DOB 14-Jan-1971, MRN [id number]  PCP:  Deeann Saint, MD  Cardiologist:   Shanetra Blumenstock [place], MD   No chief complaint on file.     History of Present Illness: [name] is a 51 y.o. male who is seen  for follow up of palpitations. He has a history of COPD - severe by PFTs in 2020.   He reports that this year he has had increased SOB associated with tightness in his chest and sometimes feeling a weight in his chest. He also has experienced his heart racing fast. This has occurred with activity but even at normal levels of activity. Has some lightheadedness and dizziness with this but no syncope. States this has improved significantly since he has been back on therapy for his COPD with Trelegy. States he received a steroid shot as well and this made him feel greate.    We performed Echo which was normal. Event monitor showed rare brief runs of SVT otherwise benign. Labs were remarkable for high cholesterol with LDL 163. Other labs OK.      Past Medical History:  Diagnosis Date   ADHD (attention deficit hyperactivity disorder)    Asthma    COPD (chronic obstructive pulmonary disease) (HCC)    GERD (gastroesophageal reflux disease)    Heart murmur    as a child     Past Surgical History:  Procedure Laterality Date   cervical fracture repair     SHOULDER ARTHROSCOPY WITH ROTATOR CUFF REPAIR AND SUBACROMIAL DECOMPRESSION Right 02/14/2020   Procedure: SHOULDER ARTHROSCOPY WITH MINI OPEN ROTATOR CUFF REPAIR AND SUBACROMIAL DECOMPRESSION AND DEBRIDEMENT;  Surgeon: Jene Every, MD;  Location: [facility];  Service: Orthopedics;  Laterality: Right;  90 MINS BLOCK OK- NO EXPAREL     Current Outpatient Medications  Medication Sig Dispense Refill   albuterol (VENTOLIN HFA) 108 (90 Base) MCG/ACT inhaler INHALE 2 PUFFS INTO THE LUNGS EVERY 4 (FOUR) HOURS AS NEEDED FOR WHEEZING OR SHORTNESS OF BREATH. 8.5 each 2    fluticasone (FLONASE) 50 MCG/ACT nasal spray Place 2 sprays into both nostrils daily.     Fluticasone-Umeclidin-Vilant (TRELEGY ELLIPTA) 200-62.5-25 MCG/ACT AEPB Inhale 1 puff into the lungs daily. 1 each 5   levocetirizine (XYZAL) 5 MG tablet Take 1 tablet (5 mg total) by mouth every evening. 30 tablet 5   predniSONE (DELTASONE) 10 MG tablet Take by mouth.     rosuvastatin (CRESTOR) 20 MG tablet Take 1 tablet (20 mg total) by mouth daily. 90 tablet 3   pantoprazole (PROTONIX) 40 MG tablet TAKE 1 TABLET BY MOUTH EVERY DAY (Patient not taking: Reported on 06/09/2022) 30 tablet 1   No current facility-administered medications for this visit.    Allergies:   Patient has no known allergies.    Social History:  The patient  reports that he quit smoking about 6 years ago. His smoking use included cigarettes. He has a 37.00 pack-year smoking history. He has never used smokeless tobacco. He reports that he does not drink alcohol and does not use drugs.   Family History:  The patient's family history includes COPD in his mother.    ROS:  Please see the history of present illness.   Otherwise, review of systems are positive for none.   All other systems are reviewed and negative.    PHYSICAL EXAM: VS:  BP 128/70   Pulse 79   Ht 5\' 7"  (  1.702 m)   Wt 176 lb 3.2 oz (79.9 kg)   SpO2 92%   BMI 27.60 kg/m  , BMI Body mass index is 27.6 kg/m. GEN: Well nourished, well developed, in no acute distress HEENT: normal Neck: no JVD, carotid bruits, or masses Cardiac: RRR; no murmurs, rubs, or gallops,no edema  Respiratory:  clear to auscultation bilaterally, normal work of breathing GI: soft, nontender, nondistended, + BS MS: no deformity or atrophy Skin: warm and dry, no rash Neuro:  Strength and sensation are intact Psych: euthymic mood, full affect   EKG:  EKG is not ordered today.   Recent Labs: 04/07/2022: ALT 29; BUN 22; Creatinine, Ser 0.93; Hemoglobin 13.9; Magnesium 2.1; Platelets  268; Potassium 4.6; Sodium 140; TSH 1.200    Lipid Panel    Component Value Date/Time   CHOL 220 (H) 04/07/2022 0924   TRIG 82 04/07/2022 0924   HDL 42 04/07/2022 0924   CHOLHDL 5.2 (H) 04/07/2022 0924   LDLCALC 163 (H) 04/07/2022 0924      Wt Readings from Last 3 Encounters:  06/09/22 176 lb 3.2 oz (79.9 kg)  04/08/22 173 lb 12.8 oz (78.8 kg)  04/06/22 171 lb 12.8 oz (77.9 kg)      Other studies Reviewed: Additional studies/ records that were reviewed today include:   Echo 04/27/22: IMPRESSIONS     1. Left ventricular ejection fraction, by estimation, is 60 to 65%. The  left ventricle has normal function. The left ventricle has no regional  wall motion abnormalities. Left ventricular diastolic parameters are  consistent with Grade I diastolic  dysfunction (impaired relaxation). The average left ventricular global  longitudinal strain is -20.2 %. The global longitudinal strain is normal.   2. Right ventricular systolic function is normal. The right ventricular  size is normal. Tricuspid regurgitation signal is inadequate for assessing  PA pressure.   3. The mitral valve is normal in structure. No evidence of mitral valve  regurgitation. No evidence of mitral stenosis.   4. The aortic valve is tricuspid. Aortic valve regurgitation is not  visualized. No aortic stenosis is present.   5. The inferior vena cava is normal in size with greater than 50%  respiratory variability, suggesting right atrial pressure of 3 mmHg.   Event monitor 04/22/22: Study Highlights    Normal sinus rhythm 3 runs of SVT longest lasting 7 beats 9 beat run of NSVT versus SVT with aberrancy. Otherwise rare ectopy     Patch Wear Time:  4 days and 14 hours (2023-06-18T21:24:35-0400 to 2023-06-23T12:21:08-399)   Patient had a min HR of 54 bpm, max HR of 158 bpm, and avg HR of 93 bpm. Predominant underlying rhythm was Sinus Rhythm. Bundle Branch Block/IVCD was present. 1 run of Ventricular  Tachycardia occurred lasting 9 beats with a max rate of 150 bpm (avg 125  bpm). 3 Supraventricular Tachycardia runs occurred, the run with the fastest interval lasting 7 beats with a max rate of 154 bpm, the longest lasting 7 beats with an avg rate of 138 bpm. Isolated SVEs were rare (<1.0%), SVE Couplets were rare (<1.0%),  and SVE Triplets were rare (<1.0%). Isolated VEs were rare (<1.0%), VE Couplets were rare (<1.0%), and no VE Triplets were present. Ventricular Trigeminy was present.d   ASSESSMENT AND PLAN:  1.  Chest tightness and pressure. This appears to be more related to his COPD. Symptoms are much better on Trelegy and prior steroids. Echo is normal. It is certainly possible some of his symptoms could  be anginal equivalent. Options for ischemic work up are limited. He would not perform well on treadmill testing. With severe COPD not a candidate for Lexiscan. Also would not be able to give beta blocker to do coronary CTA. Given great response to pulmonary therapy I think we can defer any ischemic evaluation.  2. Tachypalpitations. Short infrequent runs of SVT. Monitor really benign. Will continue watchful waiting.  3. Severe COPD 4. Hypercholesterolemia. Recommend statin therapy. Repeat labs in 3 months. Goal LDL < 100.   Current medicines are reviewed at length with the patient today.  The patient does not have concerns regarding medicines.  The following changes have been made:  no change  Labs/ tests ordered today include:   No orders of the defined types were placed in this encounter.        Disposition:   FU with me 6 months  Signed, Adaleah Forget [place], MD  06/09/2022 3:34 PM    [facility] [address], [place], [place], [address] Phone [phone], Fax [phone]

## 2022-06-09 ENCOUNTER — Encounter: Payer: Self-pay | Admitting: Cardiology

## 2022-06-09 ENCOUNTER — Ambulatory Visit: Payer: BC Managed Care – PPO | Admitting: Cardiology

## 2022-06-09 VITALS — BP 128/70 | HR 79 | Ht 67.0 in | Wt 176.2 lb

## 2022-06-09 DIAGNOSIS — R0609 Other forms of dyspnea: Secondary | ICD-10-CM | POA: Diagnosis not present

## 2022-06-09 DIAGNOSIS — E785 Hyperlipidemia, unspecified: Secondary | ICD-10-CM

## 2022-06-09 DIAGNOSIS — J449 Chronic obstructive pulmonary disease, unspecified: Secondary | ICD-10-CM

## 2022-07-11 ENCOUNTER — Encounter: Payer: Self-pay | Admitting: Nurse Practitioner

## 2022-07-11 ENCOUNTER — Ambulatory Visit: Payer: BC Managed Care – PPO | Admitting: Nurse Practitioner

## 2022-07-11 VITALS — BP 130/60 | HR 80 | Ht 66.0 in | Wt 175.0 lb

## 2022-07-11 DIAGNOSIS — J441 Chronic obstructive pulmonary disease with (acute) exacerbation: Secondary | ICD-10-CM

## 2022-07-11 DIAGNOSIS — J302 Other seasonal allergic rhinitis: Secondary | ICD-10-CM

## 2022-07-11 DIAGNOSIS — Z87891 Personal history of nicotine dependence: Secondary | ICD-10-CM | POA: Diagnosis not present

## 2022-07-11 DIAGNOSIS — J45901 Unspecified asthma with (acute) exacerbation: Secondary | ICD-10-CM | POA: Diagnosis not present

## 2022-07-11 DIAGNOSIS — R002 Palpitations: Secondary | ICD-10-CM

## 2022-07-11 LAB — NITRIC OXIDE: FeNO level (ppb): 33

## 2022-07-11 MED ORDER — PREDNISONE 20 MG PO TABS: 40.0000 mg | ORAL_TABLET | Freq: Every day | ORAL | 0 refills | Status: AC

## 2022-07-11 NOTE — Progress Notes (Signed)
@Patient  ID: [name], male    DOB: 1971-05-21, 51 y.o.   MRN: [id number]  Chief Complaint  Patient presents with   Follow-up    Wheezing and congestion off and on about a month    Referring provider: Billie Ruddy, MD  HPI: 51 year old male, former smoker followed for poorly controlled COPD/asthma.  He is a patient of Dr. Matilde Bash and last seen in office 04/08/2022 by Belenda Cruise NP.  Past medical history for perforated nasal septum, GERD, arthritis, seasonal allergies, former substance abuse.  TEST/EVENTS:  01/21/2020 CXR 2 view: Mild background emphysema without airspace disease, effusion or pneumothorax. 12/05/2020 PFTs: FVC 78, FEV1 52, ratio 52, TLC 116, DLCOcor 82.  Moderately severe obstructive airway disease with reversibility 04/27/2022 echocardiogram   08/03/2020: OV with Volanda Napoleon NP.  Compliant with Trelegy but will occasionally be without prescription for 2 to 3 days before filling and notice a difference in his asthma symptoms.  Reports that his chest tightness and cough are typically worse in the morning.  Did have some wheezing at [place].  Cough is productive with clear mucus.  Experiencing 2-3 exacerbations on average in 6 months.  Stepped up to Trelegy 200 and added Singulair at bedtime.  Treated with prednisone taper.  Recommended that he follow-up in 6 months or sooner if no improvement.  03/25/2022: OV with Fadumo Heng NP for overdue follow-up and is actually having acute symptoms as well.  He has had issues with his breathing over the last few months.  He has been trying to space out his Trelegy so he did not run out of it.  He has now been off of it for the past 3 to 4 weeks.  Has noticed a increase in shortness of breath, increase in productive cough and wheezing since.  He also has significant allergy type symptoms and chronic nasal congestion.  He does have a known septal perforation and he is anticipated to see ENT later this month.  He has also been having issues with feeling  very full after he eats and like things were sitting in his chest.  He can eat a very small meal and feels like he ate a four course dinner.  He also reports that he has had episodes where he feels like his heart is fluttering or racing. FeNO elevated to 36. Poor control seemed to be related to inconsistent use of Trelegy; does much better when he is on it. Treated with depo, pred taper and empiric doxy course for AECOPD/asthma. Restart trelegy. Started him on Xyzal for allergies and protonix for GERD. Check CBC with diff - eos 0 and allergen panel - never drawn. Referred to cardiology d/t palpitations.   04/08/2022: OV with Deliliah Spranger NP for follow-up after being treated for AECOPD/asthma.  He reports feeling significantly better after prednisone and Doxy courses last week, and restarting his Trelegy inhaler.  His cough has mostly resolved and he has not noticed any more wheezing.  Activity tolerance has significantly improved.  He is able to climb stairs without difficulties.  He did see cardiology and is going to be wearing a cardiac monitor for 2 weeks and undergo echocardiogram for further evaluation of his palpitations.  He never started on Xyzal and allergen panel was never drawn after last visit.  Feels like allergies are okay and have improved some since her last visit.  He did start on Protonix and feels like this is helped with his GERD symptoms.  No concerns or complaints today. Please notify  patient that his allergen panel came back. He has multiple environmental allergies, mostly to trees/plants. He has a very low allergy response to dog dander as well.  07/11/2022: Today - follow up Patient presents today for follow up. He was doing well since our last visit up until the last few weeks to a month. He's noticed he becomes short of breath more easily and he's wheezing more. His girlfriend is with him who tells me he became very winded and wheezy last night after changing the sheets on the bed. He's also  having some more chest congestion/tightness. He is coughing more; no more productive than usual but he feels like he has trouble bringing sputum up. White to yellow phlegm, which is normal for him. 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 He denies any fevers, chills, hemoptysis, orthopnea, leg swelling, PND. He uses his Trelegy daily. Using his albuterol a 1-2 times a week, usually before strenuous activity. They enjoy going hiking on the weekends so he always uses it before this. He has been struggling more with these activities.   FeNO 33 ppb  No Known Allergies  Immunization History  Administered Date(s) Administered   Influenza,inj,Quad PF,6+ Mos 07/05/2017, 09/01/2018   PFIZER(Purple Top)SARS-COV-2 Vaccination 12/21/2019, 01/11/2020   Tdap 01/13/2021    Past Medical History:  Diagnosis Date   ADHD (attention deficit hyperactivity disorder)    Asthma    COPD (chronic obstructive pulmonary disease) (HCC)    GERD (gastroesophageal reflux disease)    Heart murmur    as a child     Tobacco History: Social History   Tobacco Use  Smoking Status Former   Packs/day: 1.00   Years: 37.00   Total pack years: 37.00   Types: Cigarettes   Quit date: 2017   Years since quitting: 6.7  Smokeless Tobacco Never   Counseling given: Not Answered   Outpatient Medications Prior to Visit  Medication Sig Dispense Refill   albuterol (VENTOLIN HFA) 108 (90 Base) MCG/ACT inhaler INHALE 2 PUFFS INTO THE LUNGS EVERY 4 (FOUR) HOURS AS NEEDED FOR WHEEZING OR SHORTNESS OF BREATH. 8.5 each 2   fluticasone (FLONASE) 50 MCG/ACT nasal spray Place 2 sprays into both nostrils daily.     Fluticasone-Umeclidin-Vilant (TRELEGY ELLIPTA) 200-62.5-25 MCG/ACT AEPB Inhale 1 puff into the lungs daily. 1 each 5   levocetirizine (XYZAL) 5 MG tablet Take 1 tablet (5 mg total) by mouth every evening. 30 tablet 5   rosuvastatin  (CRESTOR) 20 MG tablet Take 1 tablet (20 mg total) by mouth daily. 90 tablet 3   pantoprazole (PROTONIX) 40 MG tablet TAKE 1 TABLET BY MOUTH EVERY DAY (Patient not taking: Reported on 06/09/2022) 30 tablet 1   No facility-administered medications prior to visit.     Review of Systems:   Constitutional: No weight loss or gain, night sweats, fevers, chills, fatigue, or lassitude. HEENT: No headaches, difficulty swallowing, tooth/dental problems, or sore throat. No itching, ear ache +nasal congestion, sneezing, frequent nasal clearing (improved) CV:  + Palpitations/intermittent feeling of fluttering, usually with activity (improved).  No chest pain, orthopnea, PND, swelling in lower extremities, anasarca, dizziness, syncope Resp: +shortness of breath with activity; wheezing; occasional productive cough; chest tightness/congestion. No hemoptysis.  No chest wall deformity GI:   No heartburn, indigestion, abdominal pain, nausea, vomiting, diarrhea, change in bowel habits, loss of appetite, bloody stools.  MSK:  No joint  pain or swelling.  No decreased range of motion.  No back pain. Neuro: No dizziness or lightheadedness.  Psych: No depression or anxiety. Mood stable.     Physical Exam:  BP 130/60 (BP Location: Left Arm, Cuff Size: Normal)   Pulse 80   Ht 5\' 6"  (1.676 m)   Wt 175 lb (79.4 kg)   SpO2 96%   BMI 28.25 kg/m   GEN: Pleasant, interactive, well-appearing; in no acute distress. HEENT:  Normocephalic and atraumatic. PERRLA. Sclera white. Nasal turbinates pink, moist.  Perforated septum.  No rhinorrhea present. Oropharynx pink and moist, without exudate or edema. No lesions, ulcerations, or postnasal drip.  NECK:  Supple w/ fair ROM. No JVD present. Normal carotid impulses w/o bruits. Thyroid symmetrical with no goiter or nodules palpated. No lymphadenopathy.   CV: RRR, no m/r/g, no peripheral edema. Pulses intact, +2 bilaterally. No cyanosis, pallor or clubbing. PULMONARY:   Unlabored, regular breathing.  Expiratory wheezes bilaterally A&P. No accessory muscle use. No dullness to percussion. GI: BS present and normoactive. Soft, non-tender to palpation. No organomegaly or masses detected. No CVA tenderness. MSK: No erythema, warmth or tenderness. Cap refil <2 sec all extrem. No deformities or joint swelling noted.  Neuro: A/Ox3. No focal deficits noted.   Skin: Warm, no lesions or rashe Psych: Normal affect and behavior. Judgement and thought content appropriate.     Lab Results:  CBC    Component Value Date/Time   WBC 8.5 04/07/2022 0924   WBC 11.8 (H) 03/28/2022 1142   RBC 4.54 04/07/2022 0924   RBC 4.30 03/28/2022 1142   HGB 13.9 04/07/2022 0924   HCT 40.2 04/07/2022 0924   PLT 268 04/07/2022 0924   MCV 89 04/07/2022 0924   MCH 30.6 04/07/2022 0924   MCH 31.3 02/13/2020 0813   MCHC 34.6 04/07/2022 0924   MCHC 33.7 03/28/2022 1142   RDW 13.2 04/07/2022 0924   LYMPHSABS 2.3 03/28/2022 1142   MONOABS 0.6 03/28/2022 1142   EOSABS 0.0 03/28/2022 1142   BASOSABS 0.0 03/28/2022 1142    BMET    Component Value Date/Time   NA 140 04/07/2022 0924   K 4.6 04/07/2022 0924   CL 103 04/07/2022 0924   CO2 23 04/07/2022 0924   GLUCOSE 98 04/07/2022 0924   GLUCOSE 98 02/13/2020 0813   BUN 22 04/07/2022 0924   CREATININE 0.93 04/07/2022 0924   CALCIUM 9.2 04/07/2022 0924   GFRNONAA >60 02/13/2020 0813   GFRAA >60 02/13/2020 0813    BNP No results found for: "BNP"   Imaging:  No results found.       Latest Ref Rng & Units 12/05/2018   11:42 AM  PFT Results  FVC-Pre L 3.10   FVC-Predicted Pre % 71   FVC-Post L 3.43   FVC-Predicted Post % 78   Pre FEV1/FVC % % 51   Post FEV1/FCV % % 52   FEV1-Pre L 1.57   FEV1-Predicted Pre % 46   FEV1-Post L 1.77   DLCO uncorrected ml/min/mmHg 21.01   DLCO UNC% % 82   DLCO corrected ml/min/mmHg 21.25   DLCO COR %Predicted % 82   DLVA Predicted % 83   TLC L 7.04   TLC % Predicted % 116   RV %  Predicted % 206     Lab Results  Component Value Date   NITRICOXIDE 20 11/19/2018        Assessment & Plan:   Asthma exacerbation in COPD ([place]) Acute exacerbation with elevated  exhaled nitric oxide. Prednisone burst 40 mg for 5 days. Advised he use mucinex for chest congestion/mucolytic therapy. Continue prn albuterol; ok to use 15 min prior to activity to help with endurance. We will check cbc with diff today to assess eosinophils. May need to consider addition of singulair. We also discussed biologic therapy as a potential treatment option in the future if he continues to have poor control of his asthma/COPD.   Patient Instructions  Continue Trelegy 1 puff daily. Brush tongue and rinse mouth afterwards Continue Albuterol inhaler 2 puffs every 6 hours as needed for shortness of breath or wheezing. Notify if symptoms persist despite rescue inhaler/neb use. Continue Xyzal 5 mg daily for allergies  Prednisone 40 mg daily for 5 days. Take in AM with food. 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 Vaughan Browner or [place]. If symptoms do not improve or worsen, please contact office for sooner follow up or seek emergency care.     Former smoker Former heavy smoker. Quit 2017. Referred to lung cancer screening program today.   Seasonal allergies Improved. Using xyzal occasionally. Feels his allergy symptoms are worse during spring/fall.   Palpitations Echo normal. Heart monitor evaluation was relatively unremarkable; short, infrequent runs of SVT per cardiology's note. They will move forward with watchful waiting. Felt as though his symptoms were more so related to asthma/COPD. Ischemic workup is limited. They recommended starting him on statin therapy and watchful waiting. Follow up with Dr. Martinique as scheduled.      I spent 35 minutes of dedicated to the care of this patient on  the date of this encounter to include pre-visit review of records, face-to-face time with the patient discussing conditions above, post visit ordering of testing, clinical documentation with the electronic health record, making appropriate referrals as documented, and communicating necessary findings to members of the patients care team.  Clayton Bibles, NP 07/11/2022  Pt aware and understands NP's role.

## 2022-07-11 NOTE — Patient Instructions (Addendum)
Continue Trelegy 1 puff daily. Brush tongue and rinse mouth afterwards Continue Albuterol inhaler 2 puffs every 6 hours as needed for shortness of breath or wheezing. Notify if symptoms persist despite rescue inhaler/neb use. Continue Xyzal 5 mg daily for allergies  Prednisone 40 mg daily for 5 days. Take in AM with food. 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 Vaughan Browner or [place]. If symptoms do not improve or worsen, please contact office for sooner follow up or seek emergency care.

## 2022-07-11 NOTE — Assessment & Plan Note (Signed)
Acute exacerbation with elevated exhaled nitric oxide. Prednisone burst 40 mg for 5 days. Advised he use mucinex for chest congestion/mucolytic therapy. Continue prn albuterol; ok to use 15 min prior to activity to help with endurance. We will check cbc with diff today to assess eosinophils. May need to consider addition of singulair. We also discussed biologic therapy as a potential treatment option in the future if he continues to have poor control of his asthma/COPD.   Patient Instructions  Continue Trelegy 1 puff daily. Brush tongue and rinse mouth afterwards Continue Albuterol inhaler 2 puffs every 6 hours as needed for shortness of breath or wheezing. Notify if symptoms persist despite rescue inhaler/neb use. Continue Xyzal 5 mg daily for allergies  Prednisone 40 mg daily for 5 days. Take in AM with food. 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 Vaughan Browner or [place]. If symptoms do not improve or worsen, please contact office for sooner follow up or seek emergency care.

## 2022-07-11 NOTE — Assessment & Plan Note (Signed)
Former heavy smoker. Quit 2017. Referred to lung cancer screening program today.

## 2022-07-11 NOTE — Assessment & Plan Note (Signed)
Echo normal. Heart monitor evaluation was relatively unremarkable; short, infrequent runs of SVT per cardiology's note. They will move forward with watchful waiting. Felt as though his symptoms were more so related to asthma/COPD. Ischemic workup is limited. They recommended starting him on statin therapy and watchful waiting. Follow up with Dr. Martinique as scheduled.

## 2022-07-11 NOTE — Assessment & Plan Note (Signed)
Improved. Using xyzal occasionally. Feels his allergy symptoms are worse during spring/fall.

## 2022-07-12 LAB — CBC WITH DIFFERENTIAL/PLATELET
Basophils Absolute: 0 10*3/uL (ref 0.0–0.1)
Basophils Relative: 0.5 % (ref 0.0–3.0)
Eosinophils Absolute: 0.1 10*3/uL (ref 0.0–0.7)
Eosinophils Relative: 1.3 % (ref 0.0–5.0)
HCT: 39.8 % (ref 39.0–52.0)
Hemoglobin: 13.5 g/dL (ref 13.0–17.0)
Lymphocytes Relative: 30 % (ref 12.0–46.0)
Lymphs Abs: 2.7 10*3/uL (ref 0.7–4.0)
MCHC: 33.9 g/dL (ref 30.0–36.0)
MCV: 92.7 fl (ref 78.0–100.0)
Monocytes Absolute: 0.9 10*3/uL (ref 0.1–1.0)
Monocytes Relative: 9.6 % (ref 3.0–12.0)
Neutro Abs: 5.2 10*3/uL (ref 1.4–7.7)
Neutrophils Relative %: 58.6 % (ref 43.0–77.0)
Platelets: 292 10*3/uL (ref 150.0–400.0)
RBC: 4.29 Mil/uL (ref 4.22–5.81)
RDW: 14.6 % (ref 11.5–15.5)
WBC: 8.9 10*3/uL (ref 4.0–10.5)

## 2022-07-28 ENCOUNTER — Ambulatory Visit: Payer: BC Managed Care – PPO | Admitting: Nurse Practitioner

## 2022-07-28 ENCOUNTER — Encounter: Payer: Self-pay | Admitting: Nurse Practitioner

## 2022-07-28 VITALS — BP 114/80 | HR 82 | Temp 97.9°F | Ht 66.0 in | Wt 171.4 lb

## 2022-07-28 DIAGNOSIS — J4489 Other specified chronic obstructive pulmonary disease: Secondary | ICD-10-CM

## 2022-07-28 DIAGNOSIS — J3089 Other allergic rhinitis: Secondary | ICD-10-CM

## 2022-07-28 DIAGNOSIS — J449 Chronic obstructive pulmonary disease, unspecified: Secondary | ICD-10-CM | POA: Diagnosis not present

## 2022-07-28 MED ORDER — MONTELUKAST SODIUM 10 MG PO TABS: 10.0000 mg | ORAL_TABLET | Freq: Every day | ORAL | 5 refills | Status: DC

## 2022-07-28 NOTE — Progress Notes (Signed)
@Patient  ID: [name], male    DOB: 12-24-1970, 51 y.o.   MRN: [id number]  No chief complaint on file.   Referring provider: Billie Ruddy, MD  HPI: 51 year old male, former smoker followed for poorly controlled COPD/asthma.  He is a patient of Dr. Matilde Bash and last seen in office 04/08/2022 by Belenda Cruise NP.  Past medical history for perforated nasal septum, GERD, arthritis, seasonal allergies, former substance abuse.  TEST/EVENTS:  01/21/2020 CXR 2 view: Mild background emphysema without airspace disease, effusion or pneumothorax. 12/05/2020 PFTs: FVC 78, FEV1 52, ratio 52, TLC 116, DLCOcor 82.  Moderately severe obstructive airway disease with reversibility 04/08/2022 positive RAST, IgE 104 04/27/2022 echocardiogram: EF 60-65, GIDD,   08/03/2020: OV with Volanda Napoleon NP.  Compliant with Trelegy but will occasionally be without prescription for 2 to 3 days before filling and notice a difference in his asthma symptoms.  Reports that his chest tightness and cough are typically worse in the morning.  Did have some wheezing at [place].  Cough is productive with clear mucus.  Experiencing 2-3 exacerbations on average in 6 months.  Stepped up to Trelegy 200 and added Singulair at bedtime.  Treated with prednisone taper.  Recommended that he follow-up in 6 months or sooner if no improvement.  03/25/2022: OV with Jurnee Nakayama NP for overdue follow-up and is actually having acute symptoms as well.  He has had issues with his breathing over the last few months.  He has been trying to space out his Trelegy so he did not run out of it.  He has now been off of it for the past 3 to 4 weeks.  Has noticed a increase in shortness of breath, increase in productive cough and wheezing since.  He also has significant allergy type symptoms and chronic nasal congestion.  He does have a known septal perforation and he is anticipated to see ENT later this month.  He has also been having issues with feeling very full after he eats and  like things were sitting in his chest.  He can eat a very small meal and feels like he ate a four course dinner.  He also reports that he has had episodes where he feels like his heart is fluttering or racing. FeNO elevated to 36. Poor control seemed to be related to inconsistent use of Trelegy; does much better when he is on it. Treated with depo, pred taper and empiric doxy course for AECOPD/asthma. Restart trelegy. Started him on Xyzal for allergies and protonix for GERD. Check CBC with diff - eos 0 and allergen panel - never drawn. Referred to cardiology d/t palpitations.   04/08/2022: OV with Zakar Brosch NP for follow-up after being treated for AECOPD/asthma.  He reports feeling significantly better after prednisone and Doxy courses last week, and restarting his Trelegy inhaler.  His cough has mostly resolved and he has not noticed any more wheezing.  Activity tolerance has significantly improved.  He is able to climb stairs without difficulties.  He did see cardiology and is going to be wearing a cardiac monitor for 2 weeks and undergo echocardiogram for further evaluation of his palpitations.  He never started on Xyzal and allergen panel was never drawn after last visit.  Feels like allergies are okay and have improved some since her last visit.  He did start on Protonix and feels like this is helped with his GERD symptoms.  No concerns or complaints today. Please notify patient that his allergen panel came back. He has  multiple environmental allergies, mostly to trees/plants. He has a very low allergy response to dog dander as well.  07/11/2022: OV with Kialee Kham NP for follow up. He was doing well since our last visit up until the last few weeks to a month. He's noticed he becomes short of breath more easily and he's wheezing more. Acute exacerbation with elevated exhaled nitric oxide. Prednisone burst 40 mg for 5 days. Advised he use mucinex for chest congestion/mucolytic therapy. Continue prn albuterol; ok to use  15 min prior to activity to help with endurance. We will check cbc with diff today to assess eosinophils. May need to consider addition of singulair. We also discussed biologic therapy as a potential treatment option in the future if he continues to have poor control of his asthma/COPD  07/28/2022: Today - follow up Patient presents today for follow up. He reports feeling better since he was here last. He feels like the prednisone course helped. He still occasionally has some chest congestion and cough, but this is improved. His breathing is stable. He denies noticing much wheezing. He's using his Trelegy daily. Takes Xyzal daily for allergies.   No Known Allergies  Immunization History  Administered Date(s) Administered   Influenza,inj,Quad PF,6+ Mos 07/05/2017, 09/01/2018   PFIZER(Purple Top)SARS-COV-2 Vaccination 12/21/2019, 01/11/2020   Tdap 01/13/2021    Past Medical History:  Diagnosis Date   ADHD (attention deficit hyperactivity disorder)    Asthma    COPD (chronic obstructive pulmonary disease) (HCC)    GERD (gastroesophageal reflux disease)    Heart murmur    as a child     Tobacco History: Social History   Tobacco Use  Smoking Status Former   Packs/day: 1.00   Years: 37.00   Total pack years: 37.00   Types: Cigarettes   Quit date: 2017   Years since quitting: 6.7  Smokeless Tobacco Never   Counseling given: Not Answered   Outpatient Medications Prior to Visit  Medication Sig Dispense Refill   albuterol (VENTOLIN HFA) 108 (90 Base) MCG/ACT inhaler INHALE 2 PUFFS INTO THE LUNGS EVERY 4 (FOUR) HOURS AS NEEDED FOR WHEEZING OR SHORTNESS OF BREATH. 8.5 each 2   fluticasone (FLONASE) 50 MCG/ACT nasal spray Place 2 sprays into both nostrils daily.     Fluticasone-Umeclidin-Vilant (TRELEGY ELLIPTA) 200-62.5-25 MCG/ACT AEPB Inhale 1 puff into the lungs daily. 1 each 5   levocetirizine (XYZAL) 5 MG tablet Take 1 tablet (5 mg total) by mouth every evening. 30 tablet 5    rosuvastatin (CRESTOR) 20 MG tablet Take 1 tablet (20 mg total) by mouth daily. 90 tablet 3   pantoprazole (PROTONIX) 40 MG tablet TAKE 1 TABLET BY MOUTH EVERY DAY (Patient not taking: Reported on 06/09/2022) 30 tablet 1   No facility-administered medications prior to visit.     Review of Systems:   Constitutional: No weight loss or gain, night sweats, fevers, chills, fatigue, or lassitude. HEENT: No headaches, difficulty swallowing, tooth/dental problems, or sore throat. No itching, ear ache +nasal congestion, sneezing, frequent nasal clearing (improved) CV:    No chest pain, orthopnea, PND, palpitations, swelling in lower extremities, anasarca, dizziness, syncope Resp: +shortness of breath with activity (improved); rare wheezing; occasional productive cough; chest congestion (improved). No hemoptysis.  No chest wall deformity GI:   No heartburn, indigestion, abdominal pain, nausea, vomiting, diarrhea, change in bowel habits, loss of appetite, bloody stools.  MSK:  No joint pain or swelling.  No decreased range of motion.  No back pain. Neuro: No dizziness or  lightheadedness.  Psych: No depression or anxiety. Mood stable.     Physical Exam:  BP 114/80 (BP Location: Right Arm, Patient Position: Sitting, Cuff Size: Normal)   Pulse 82   Temp 97.9 F (36.6 C) (Oral)   Ht 5\' 6"  (1.676 m)   Wt 171 lb 6.4 oz (77.7 kg)   SpO2 100%   BMI 27.66 kg/m   GEN: Pleasant, interactive, well-appearing; in no acute distress. HEENT:  Normocephalic and atraumatic. PERRLA. Sclera white. Nasal turbinates pink, moist.  Perforated septum.  No rhinorrhea present. Oropharynx pink and moist, without exudate or edema. No lesions, ulcerations, or postnasal drip.  NECK:  Supple w/ fair ROM. No JVD present. Normal carotid impulses w/o bruits. Thyroid symmetrical with no goiter or nodules palpated. No lymphadenopathy.   CV: RRR, no m/r/g, no peripheral edema. Pulses intact, +2 bilaterally. No cyanosis, pallor or  clubbing. PULMONARY:  Unlabored, regular breathing.  Clear bilaterally A&P w/o wheezes/rales/rhonchi. No accessory muscle use. No dullness to percussion. GI: BS present and normoactive. Soft, non-tender to palpation. No organomegaly or masses detected.  MSK: No erythema, warmth or tenderness. No deformities or joint swelling noted.  Neuro: A/Ox3. No focal deficits noted.   Skin: Warm, no lesions or rashe Psych: Normal affect and behavior. Judgement and thought content appropriate.     Lab Results:  CBC    Component Value Date/Time   WBC 8.9 07/11/2022 1636   RBC 4.29 07/11/2022 1636   HGB 13.5 07/11/2022 1636   HGB 13.9 04/07/2022 0924   HCT 39.8 07/11/2022 1636   HCT 40.2 04/07/2022 0924   PLT 292.0 07/11/2022 1636   PLT 268 04/07/2022 0924   MCV 92.7 07/11/2022 1636   MCV 89 04/07/2022 0924   MCH 30.6 04/07/2022 0924   MCH 31.3 02/13/2020 0813   MCHC 33.9 07/11/2022 1636   RDW 14.6 07/11/2022 1636   RDW 13.2 04/07/2022 0924   LYMPHSABS 2.7 07/11/2022 1636   MONOABS 0.9 07/11/2022 1636   EOSABS 0.1 07/11/2022 1636   BASOSABS 0.0 07/11/2022 1636    BMET    Component Value Date/Time   NA 140 04/07/2022 0924   K 4.6 04/07/2022 0924   CL 103 04/07/2022 0924   CO2 23 04/07/2022 0924   GLUCOSE 98 04/07/2022 0924   GLUCOSE 98 02/13/2020 0813   BUN 22 04/07/2022 0924   CREATININE 0.93 04/07/2022 0924   CALCIUM 9.2 04/07/2022 0924   GFRNONAA >60 02/13/2020 0813   GFRAA >60 02/13/2020 0813    BNP No results found for: "BNP"   Imaging:  No results found.       Latest Ref Rng & Units 12/05/2018   11:42 AM  PFT Results  FVC-Pre L 3.10   FVC-Predicted Pre % 71   FVC-Post L 3.43   FVC-Predicted Post % 78   Pre FEV1/FVC % % 51   Post FEV1/FCV % % 52   FEV1-Pre L 1.57   FEV1-Predicted Pre % 46   FEV1-Post L 1.77   DLCO uncorrected ml/min/mmHg 21.01   DLCO UNC% % 82   DLCO corrected ml/min/mmHg 21.25   DLCO COR %Predicted % 82   DLVA Predicted % 83   TLC  L 7.04   TLC % Predicted % 116   RV % Predicted % 206     Lab Results  Component Value Date   NITRICOXIDE 20 11/19/2018        Assessment & Plan:   COPD mixed type ([place]) COPD with asthma. Recently treated  for acute exacerbation, which appears resolved. Encouraged him to use mucinex for mucociliary clearance and occasional chest congestion. Add singulair for trigger prevention. Continue scheduled bronchodilator regimen with trelegy and prn albuterol. Continue measures to avoid triggers.  Patient Instructions  Continue Trelegy 1 puff daily. Brush tongue and rinse mouth afterwards Continue Albuterol inhaler 2 puffs every 6 hours as needed for shortness of breath or wheezing. Notify if symptoms persist despite rescue inhaler/neb use. 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 Vaughan Browner or Katie Bulmaro Feagans,NP. If symptoms do not improve or worsen, please contact office for sooner follow up or seek emergency care.     Allergic rhinitis Positive RAST. He is doing better with Xyzal but tends to flare during allergy seasons. See above plan.    I spent 28 minutes of dedicated to the care of this patient on the date of this encounter to include pre-visit review of records, face-to-face time with the patient discussing conditions above, post visit ordering of testing, clinical documentation with the electronic health record, making appropriate referrals as documented, and communicating necessary findings to members of the patients care team.  Clayton Bibles, NP 07/28/2022  Pt aware and understands NP's role.

## 2022-07-28 NOTE — Assessment & Plan Note (Signed)
Positive RAST. He is doing better with Xyzal but tends to flare during allergy seasons. See above plan.

## 2022-07-28 NOTE — Patient Instructions (Addendum)
Continue Trelegy 1 puff daily. Brush tongue and rinse mouth afterwards Continue Albuterol inhaler 2 puffs every 6 hours as needed for shortness of breath or wheezing. Notify if symptoms persist despite rescue inhaler/neb use. 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 Vaughan Browner or Katie Nitika Jackowski,NP. If symptoms do not improve or worsen, please contact office for sooner follow up or seek emergency care.

## 2022-07-28 NOTE — Assessment & Plan Note (Signed)
COPD with asthma. Recently treated for acute exacerbation, which appears resolved. Encouraged him to use mucinex for mucociliary clearance and occasional chest congestion. Add singulair for trigger prevention. Continue scheduled bronchodilator regimen with trelegy and prn albuterol. Continue measures to avoid triggers.  Patient Instructions  Continue Trelegy 1 puff daily. Brush tongue and rinse mouth afterwards Continue Albuterol inhaler 2 puffs every 6 hours as needed for shortness of breath or wheezing. Notify if symptoms persist despite rescue inhaler/neb use. 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 Vaughan Browner or Katie Taressa Rauh,NP. If symptoms do not improve or worsen, please contact office for sooner follow up or seek emergency care.

## 2022-08-01 DIAGNOSIS — R519 Headache, unspecified: Secondary | ICD-10-CM | POA: Diagnosis not present

## 2022-08-10 ENCOUNTER — Ambulatory Visit: Payer: BC Managed Care – PPO | Admitting: Pulmonary Disease

## 2022-08-18 ENCOUNTER — Other Ambulatory Visit: Payer: Self-pay

## 2022-08-18 DIAGNOSIS — Z122 Encounter for screening for malignant neoplasm of respiratory organs: Secondary | ICD-10-CM

## 2022-08-18 DIAGNOSIS — Z87891 Personal history of nicotine dependence: Secondary | ICD-10-CM

## 2022-08-18 DIAGNOSIS — J4540 Moderate persistent asthma, uncomplicated: Secondary | ICD-10-CM

## 2022-08-18 DIAGNOSIS — J441 Chronic obstructive pulmonary disease with (acute) exacerbation: Secondary | ICD-10-CM

## 2022-08-18 MED ORDER — ALBUTEROL SULFATE HFA 108 (90 BASE) MCG/ACT IN AERS: 2.0000 | INHALATION_SPRAY | RESPIRATORY_TRACT | 2 refills | Status: DC | PRN

## 2022-09-09 ENCOUNTER — Other Ambulatory Visit: Payer: Self-pay

## 2022-09-09 DIAGNOSIS — K219 Gastro-esophageal reflux disease without esophagitis: Secondary | ICD-10-CM

## 2022-09-09 MED ORDER — PANTOPRAZOLE SODIUM 40 MG PO TBEC: 40.0000 mg | DELAYED_RELEASE_TABLET | Freq: Every day | ORAL | 1 refills | Status: DC

## 2022-09-27 DIAGNOSIS — Z6827 Body mass index (BMI) 27.0-27.9, adult: Secondary | ICD-10-CM | POA: Diagnosis not present

## 2022-09-27 DIAGNOSIS — L03115 Cellulitis of right lower limb: Secondary | ICD-10-CM | POA: Diagnosis not present

## 2022-10-04 ENCOUNTER — Ambulatory Visit (INDEPENDENT_AMBULATORY_CARE_PROVIDER_SITE_OTHER): Payer: BC Managed Care – PPO | Admitting: Acute Care

## 2022-10-04 ENCOUNTER — Encounter: Payer: Self-pay | Admitting: Acute Care

## 2022-10-04 DIAGNOSIS — Z87891 Personal history of nicotine dependence: Secondary | ICD-10-CM | POA: Diagnosis not present

## 2022-10-04 NOTE — Patient Instructions (Signed)
Thank you for participating in the [facility]. It was our pleasure to meet you today. We will call you with the results of your scan within the next few days. Your scan will be assigned a Lung RADS category score by the physicians reading the scans.  This Lung RADS score determines follow up scanning.  See below for description of categories, and follow up screening recommendations. We will be in touch to schedule your follow up screening annually or based on recommendations of our providers. We will fax a copy of your scan results to your Primary Care Physician, or the physician who referred you to the program, to ensure they have the results. Please call the office if you have any questions or concerns regarding your scanning experience or results.  Our office number is [phone]. Please speak with Denise Phelps, RN. , or  Denise Buckner RN, They are  our Lung Cancer Screening RN.'s If They are unavailable when you call, Please leave a message on the voice mail. We will return your call at our earliest convenience.This voice mail is monitored several times a day.  Remember, if your scan is normal, we will scan you annually as long as you continue to meet the criteria for the program. (Age 55-77, Current smoker or smoker who has quit within the last 15 years). If you are a smoker, remember, quitting is the single most powerful action that you can take to decrease your risk of lung cancer and other pulmonary, breathing related problems. We know quitting is hard, and we are here to help.  Please let us know if there is anything we can do to help you meet your goal of quitting. If you are a former smoker, congratulations. We are proud of you! Remain smoke free! Remember you can refer friends or family members through the number above.  We will screen them to make sure they meet criteria for the program. Thank you for helping us take better care of you by  participating in Lung Screening.  You can receive free nicotine replacement therapy ( patches, gum or mints) by calling 1-800-QUIT NOW. Please call so we can get you on the path to becoming  a non-smoker. I know it is hard, but you can do this!  Lung RADS Categories:  Lung RADS 1: no nodules or definitely non-concerning nodules.  Recommendation is for a repeat annual scan in 12 months.  Lung RADS 2:  nodules that are non-concerning in appearance and behavior with a very low likelihood of becoming an active cancer. Recommendation is for a repeat annual scan in 12 months.  Lung RADS 3: nodules that are probably non-concerning , includes nodules with a low likelihood of becoming an active cancer.  Recommendation is for a 6-month repeat screening scan. Often noted after an upper respiratory illness. We will be in touch to make sure you have no questions, and to schedule your 6-month scan.  Lung RADS 4 A: nodules with concerning findings, recommendation is most often for a follow up scan in 3 months or additional testing based on our provider's assessment of the scan. We will be in touch to make sure you have no questions and to schedule the recommended 3 month follow up scan.  Lung RADS 4 B:  indicates findings that are concerning. We will be in touch with you to schedule additional diagnostic testing based on our provider's  assessment of the scan.  Other options for assistance in smoking cessation (   As covered by your insurance benefits)  Hypnosis for smoking cessation  [employer] [phone]  Acupuncture for smoking cessation  [facility] [phone]   

## 2022-10-04 NOTE — Progress Notes (Signed)
Virtual Visit via Telephone Note  I connected with [name] on 10/04/22 at 10:30 AM EST by telephone and verified that I am speaking with the correct person using two identifiers.  Location: Patient:  At home Provider:  [address] [place], [place], [address]    I discussed the limitations, risks, security and privacy concerns of performing an evaluation and management service by telephone and the availability of in person appointments. I also discussed with the patient that there may be a patient responsible charge related to this service. The patient expressed understanding and agreed to proceed.   Shared Decision Making Visit Lung Cancer Screening Program [phone])   Eligibility: Age 51 y.o. Pack Years Smoking History Calculation 60 pack year smoking history (# packs/per year x # years smoked) Recent History of coughing up blood  no Unexplained weight loss? no ( >Than 15 pounds within the last 6 months ) Prior History Lung / other cancer no (Diagnosis within the last 5 years already requiring surveillance chest CT Scans). Smoking Status Former Smoker Former Smokers: Years since quit: 6 years  Quit Date: 2017  Visit Components: Discussion included one or more decision making aids. yes Discussion included risk/benefits of screening. yes Discussion included potential follow up diagnostic testing for abnormal scans. yes Discussion included meaning and risk of over diagnosis. yes Discussion included meaning and risk of False Positives. yes Discussion included meaning of total radiation exposure. yes  Counseling Included: Importance of adherence to annual lung cancer LDCT screening. yes Impact of comorbidities on ability to participate in the program. yes Ability and willingness to under diagnostic treatment. yes  Smoking Cessation Counseling: Current Smokers:  Discussed importance of smoking cessation. yes Information about tobacco cessation classes and  interventions provided to patient. yes Patient provided with "ticket" for LDCT Scan. yes Symptomatic Patient. no  Counseling NA Diagnosis Code: Tobacco Use Z72.0 Asymptomatic Patient yes  Counseling (Intermediate counseling: > three minutes counseling) R1540 Former Smokers:  Discussed the importance of maintaining cigarette abstinence. yes Diagnosis Code: Personal History of Nicotine Dependence. G86.761 Information about tobacco cessation classes and interventions provided to patient. Yes Patient provided with "ticket" for LDCT Scan. yes Written Order for Lung Cancer Screening with LDCT placed in Epic. Yes (CT Chest Lung Cancer Screening Low Dose W/O CM) [id number] Z12.2-Screening of respiratory organs Z87.891-Personal history of nicotine dependence  I spent 25 minutes of face to face time/virtual visit time  with  [name] discussing the risks and benefits of lung cancer screening. We took the time to pause the power point at intervals to allow for questions to be asked and answered to ensure understanding. We discussed that he had taken the single most powerful action possible to decrease his risk of developing lung cancer when he quit smoking. I counseled him to remain smoke free, and to contact me if he ever had the desire to smoke again so that I can provide resources and tools to help support the effort to remain smoke free. We discussed the time and location of the scan, and that either  Randall Miyamoto RN, Karlton Lemon, RN or I  or I will call / send a letter with the results within  24-72 hours of receiving them. He has the office contact information in the event he needs to speak with me,  he verbalized understanding of all of the above and had no further questions upon leaving the office.     I explained to the patient that there has  been a high incidence of coronary artery disease noted on these exams. I explained that this is a non-gated exam therefore degree or severity cannot be  determined. This patient is on statin therapy. I have asked the patient to follow-up with their PCP regarding any incidental finding of coronary artery disease and management with diet or medication as they feel is clinically indicated. The patient verbalized understanding of the above and had no further questions.     Randall Spatz, NP 10/04/2022

## 2022-10-07 ENCOUNTER — Ambulatory Visit
Admission: RE | Admit: 2022-10-07 | Discharge: 2022-10-07 | Disposition: A | Discharge status: Home / Self Care | Payer: BC Managed Care – PPO | Source: Ambulatory Visit | Attending: Family Medicine | Admitting: Family Medicine

## 2022-10-07 DIAGNOSIS — Z87891 Personal history of nicotine dependence: Secondary | ICD-10-CM

## 2022-10-07 DIAGNOSIS — Z122 Encounter for screening for malignant neoplasm of respiratory organs: Secondary | ICD-10-CM

## 2022-10-10 ENCOUNTER — Other Ambulatory Visit: Payer: Self-pay | Admitting: Nurse Practitioner

## 2022-10-10 ENCOUNTER — Other Ambulatory Visit: Payer: Self-pay

## 2022-10-10 DIAGNOSIS — Z87891 Personal history of nicotine dependence: Secondary | ICD-10-CM

## 2022-10-10 DIAGNOSIS — J441 Chronic obstructive pulmonary disease with (acute) exacerbation: Secondary | ICD-10-CM

## 2022-10-10 DIAGNOSIS — Z122 Encounter for screening for malignant neoplasm of respiratory organs: Secondary | ICD-10-CM

## 2022-10-28 ENCOUNTER — Ambulatory Visit: Payer: BC Managed Care – PPO | Admitting: Nurse Practitioner

## 2022-10-28 ENCOUNTER — Encounter: Payer: Self-pay | Admitting: Nurse Practitioner

## 2022-10-28 VITALS — BP 104/74 | HR 84 | Ht 66.0 in | Wt 177.4 lb

## 2022-10-28 DIAGNOSIS — J3089 Other allergic rhinitis: Secondary | ICD-10-CM

## 2022-10-28 DIAGNOSIS — Z87891 Personal history of nicotine dependence: Secondary | ICD-10-CM | POA: Diagnosis not present

## 2022-10-28 DIAGNOSIS — J449 Chronic obstructive pulmonary disease, unspecified: Secondary | ICD-10-CM | POA: Diagnosis not present

## 2022-10-28 DIAGNOSIS — I7090 Unspecified atherosclerosis: Secondary | ICD-10-CM | POA: Insufficient documentation

## 2022-10-28 NOTE — Assessment & Plan Note (Signed)
Incidental finding on CT.  He was diagnosed with HLD at his last cardiology appointment and recommended to start statin therapy.  He declined to do so at the time.  He is going to reevaluate therapy and plans to schedule follow-up with cardiology to discuss this further.

## 2022-10-28 NOTE — Assessment & Plan Note (Signed)
COPD with asthma.  Compensated on triple therapy regimen with Trelegy.  Encouraged to remain active.  COPD/asthma action plan in place.  Patient Instructions  Continue Trelegy 1 puff daily. Brush tongue and rinse mouth afterwards Continue Albuterol inhaler 2 puffs every 6 hours as needed for shortness of breath or wheezing. Notify if symptoms persist despite rescue inhaler/neb use. 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 Vaughan Browner or Katie Arlana Canizales,NP. If symptoms worsen, please contact office for sooner follow up or seek emergency care.

## 2022-10-28 NOTE — Progress Notes (Signed)
@Patient  ID: [name], male    DOB: Sep 04, 1971, 52 y.o.   MRN: [id number]  Chief Complaint  Patient presents with   Follow-up    Pt f/u he states he is feeling okay he has good/bad days.     Referring provider: [id number], MD  HPI: 52 year old male, former smoker followed for poorly controlled COPD/asthma.  He is a patient of Dr. 40 and last seen in office 07/28/2022 by [facility] NP.  Past medical history for perforated nasal septum, GERD, arthritis, seasonal allergies, former substance abuse.  TEST/EVENTS:  01/21/2020 CXR 2 view: Mild background emphysema without airspace disease, effusion or pneumothorax. 12/05/2020 PFTs: FVC 78, FEV1 52, ratio 52, TLC 116, DLCOcor 82.  Moderately severe obstructive airway disease with reversibility 04/08/2022 positive RAST, IgE 104 04/27/2022 echocardiogram: EF 60-65, GIDD,   08/03/2020: OV with 10/03/2020 NP.  Compliant with Trelegy but will occasionally be without prescription for 2 to 3 days before filling and notice a difference in his asthma symptoms.  Reports that his chest tightness and cough are typically worse in the morning.  Did have some wheezing at OV.  Cough is productive with clear mucus.  Experiencing 2-3 exacerbations on average in 6 months.  Stepped up to Trelegy 200 and added Singulair at bedtime.  Treated with prednisone taper.  Recommended that he follow-up in 6 months or sooner if no improvement.  03/25/2022: OV with Christabelle Hanzlik NP for overdue follow-up and is actually having acute symptoms as well.  He has had issues with his breathing over the last few months.  He has been trying to space out his Trelegy so he did not run out of it.  He has now been off of it for the past 3 to 4 weeks.  Has noticed a increase in shortness of breath, increase in productive cough and wheezing since.  He also has significant allergy type symptoms and chronic nasal congestion.  He does have a known septal perforation and he is anticipated to see ENT later  this month.  He has also been having issues with feeling very full after he eats and like things were sitting in his chest.  He can eat a very small meal and feels like he ate a four course dinner.  He also reports that he has had episodes where he feels like his heart is fluttering or racing. FeNO elevated to 36. Poor control seemed to be related to inconsistent use of Trelegy; does much better when he is on it. Treated with depo, pred taper and empiric doxy course for AECOPD/asthma. Restart trelegy. Started him on Xyzal for allergies and protonix for GERD. Check CBC with diff - eos 0 and allergen panel - never drawn. Referred to cardiology d/t palpitations.   04/08/2022: OV with Alani Lacivita NP for follow-up after being treated for AECOPD/asthma.  He reports feeling significantly better after prednisone and Doxy courses last week, and restarting his Trelegy inhaler.  His cough has mostly resolved and he has not noticed any more wheezing.  Activity tolerance has significantly improved.  He is able to climb stairs without difficulties.  He did see cardiology and is going to be wearing a cardiac monitor for 2 weeks and undergo echocardiogram for further evaluation of his palpitations.  He never started on Xyzal and allergen panel was never drawn after last visit.  Feels like allergies are okay and have improved some since her last visit.  He did start on Protonix and feels like this is helped  with his GERD symptoms.  No concerns or complaints today. Please notify patient that his allergen panel came back. He has multiple environmental allergies, mostly to trees/plants. He has a very low allergy response to dog dander as well.  07/11/2022: OV with Riann Oman NP for follow up. He was doing well since our last visit up until the last few weeks to a month. He's noticed he becomes short of breath more easily and he's wheezing more. Acute exacerbation with elevated exhaled nitric oxide. Prednisone burst 40 mg for 5 days. Advised he  use mucinex for chest congestion/mucolytic therapy. Continue prn albuterol; ok to use 15 min prior to activity to help with endurance. We will check cbc with diff today to assess eosinophils. May need to consider addition of singulair. We also discussed biologic therapy as a potential treatment option in the future if he continues to have poor control of his asthma/COPD  07/28/2022: OV with Moira Umholtz NP today for follow up. He reports feeling better since he was here last. He feels like the prednisone course helped. He still occasionally has some chest congestion and cough, but this is improved. His breathing is stable. He denies noticing much wheezing. He's using his Trelegy daily. Takes Xyzal daily for allergies.   10/28/2022: Today-follow-up Patient presents today for follow-up.  His breathing has been stable since he was here last.  Able to do things around the house that any difficulty.  He does find that he gets short winded if he has to carry things up the stairs of their apartment.  Otherwise does not have to use his rescue inhaler much.  Cough waxes and wanes.  Occasionally productive and other days does not really have much of 1.  Denies any increased chest congestion or wheezing.  He is using his Trelegy daily.  Still takes either Xyzal or Claritin for allergies.  Using Singulair at night.  He did have low-dose lung cancer screening CT 12/15.  He had a small 3 millimeter nodule.  Lung RADS 2.  He did have atherosclerosis.  He does have a cardiologist.  He was told to start on a statin the last time he was seen there.  He never did this.  He plans to go back for follow-up appointment with them.  No chest pain.  Has not noticed as many palpitations recently.  No Known Allergies  Immunization History  Administered Date(s) Administered   Influenza,inj,Quad PF,6+ Mos 07/05/2017, 09/01/2018   PFIZER(Purple Top)SARS-COV-2 Vaccination 12/21/2019, 01/11/2020   Tdap 01/13/2021    Past Medical History:   Diagnosis Date   ADHD (attention deficit hyperactivity disorder)    Asthma    COPD (chronic obstructive pulmonary disease) (HCC)    GERD (gastroesophageal reflux disease)    Heart murmur    as a child     Tobacco History: Social History   Tobacco Use  Smoking Status Former   Packs/day: 1.00   Years: 37.00   Total pack years: 37.00   Types: Cigarettes   Quit date: 2017   Years since quitting: 7.0  Smokeless Tobacco Never   Counseling given: Not Answered   Outpatient Medications Prior to Visit  Medication Sig Dispense Refill   albuterol (VENTOLIN HFA) 108 (90 Base) MCG/ACT inhaler Inhale 2 puffs into the lungs every 4 (four) hours as needed for wheezing or shortness of breath. 8.5 each 2   montelukast (SINGULAIR) 10 MG tablet Take 1 tablet (10 mg total) by mouth at bedtime. 30 tablet 5   pantoprazole (PROTONIX)  40 MG tablet Take 1 tablet (40 mg total) by mouth daily. 30 tablet 1   TRELEGY ELLIPTA 200-62.5-25 MCG/ACT AEPB TAKE 1 PUFF BY MOUTH EVERY DAY 60 each 5   fluticasone (FLONASE) 50 MCG/ACT nasal spray Place 2 sprays into both nostrils daily. (Patient not taking: Reported on 10/28/2022)     levocetirizine (XYZAL) 5 MG tablet Take 1 tablet (5 mg total) by mouth every evening. (Patient not taking: Reported on 10/28/2022) 30 tablet 5   rosuvastatin (CRESTOR) 20 MG tablet Take 1 tablet (20 mg total) by mouth daily. (Patient not taking: Reported on 10/28/2022) 90 tablet 3   No facility-administered medications prior to visit.     Review of Systems:   Constitutional: No weight loss or gain, night sweats, fevers, chills, fatigue, or lassitude. HEENT: No headaches, difficulty swallowing, tooth/dental problems, or sore throat. No itching, ear ache +nasal congestion, sneezing, frequent nasal clearing (improved) CV:    No chest pain, orthopnea, PND, palpitations, swelling in lower extremities, anasarca, dizziness, syncope Resp: +shortness of breath with activity; occasional productive  cough. No wheezing. No hemoptysis.  No chest wall deformity GI:   No heartburn, indigestion, abdominal pain, nausea, vomiting, diarrhea, change in bowel habits, loss of appetite, bloody stools.  MSK:  No joint pain or swelling.  No decreased range of motion.  No back pain. Neuro: No dizziness or lightheadedness.  Psych: No depression or anxiety. Mood stable.     Physical Exam:  BP 104/74   Pulse 84   Ht 5\' 6"  (1.676 m)   Wt 177 lb 6.4 oz (80.5 kg)   SpO2 95%   BMI 28.63 kg/m   GEN: Pleasant, interactive, well-appearing; in no acute distress. HEENT:  Normocephalic and atraumatic. PERRLA. Sclera white. Nasal turbinates pink, moist.  Perforated septum.  No rhinorrhea present. Oropharynx pink and moist, without exudate or edema. No lesions, ulcerations, or postnasal drip.  NECK:  Supple w/ fair ROM. No JVD present. Normal carotid impulses w/o bruits. Thyroid symmetrical with no goiter or nodules palpated. No lymphadenopathy.   CV: RRR, no m/r/g, no peripheral edema. Pulses intact, +2 bilaterally. No cyanosis, pallor or clubbing. PULMONARY:  Unlabored, regular breathing.  Clear bilaterally A&P w/o wheezes/rales/rhonchi. No accessory muscle use. No dullness to percussion. GI: BS present and normoactive. Soft, non-tender to palpation. No organomegaly or masses detected.  MSK: No erythema, warmth or tenderness. No deformities or joint swelling noted.  Neuro: A/Ox3. No focal deficits noted.   Skin: Warm, no lesions or rashe Psych: Normal affect and behavior. Judgement and thought content appropriate.     Lab Results:  CBC    Component Value Date/Time   WBC 8.9 07/11/2022 1636   RBC 4.29 07/11/2022 1636   HGB 13.5 07/11/2022 1636   HGB 13.9 04/07/2022 0924   HCT 39.8 07/11/2022 1636   HCT 40.2 04/07/2022 0924   PLT 292.0 07/11/2022 1636   PLT 268 04/07/2022 0924   MCV 92.7 07/11/2022 1636   MCV 89 04/07/2022 0924   MCH 30.6 04/07/2022 0924   MCH 31.3 02/13/2020 0813   MCHC 33.9  07/11/2022 1636   RDW 14.6 07/11/2022 1636   RDW 13.2 04/07/2022 0924   LYMPHSABS 2.7 07/11/2022 1636   MONOABS 0.9 07/11/2022 1636   EOSABS 0.1 07/11/2022 1636   BASOSABS 0.0 07/11/2022 1636    BMET    Component Value Date/Time   NA 140 04/07/2022 0924   K 4.6 04/07/2022 0924   CL 103 04/07/2022 0924   CO2 23  04/07/2022 0924   GLUCOSE 98 04/07/2022 0924   GLUCOSE 98 02/13/2020 0813   BUN 22 04/07/2022 0924   CREATININE 0.93 04/07/2022 0924   CALCIUM 9.2 04/07/2022 0924   GFRNONAA >60 02/13/2020 0813   GFRAA >60 02/13/2020 0813    BNP No results found for: "BNP"   Imaging:  CT CHEST LUNG CA SCREEN LOW DOSE W/O CM  Result Date: 10/09/2022 CLINICAL DATA:  52 year old male former smoker (quit 6 years ago) with 60 pack-year history of smoking. Lung cancer screening examination. EXAM: CT CHEST WITHOUT CONTRAST LOW-DOSE FOR LUNG CANCER SCREENING TECHNIQUE: Multidetector CT imaging of the chest was performed following the standard protocol without IV contrast. RADIATION DOSE REDUCTION: This exam was performed according to the departmental dose-optimization program which includes automated exposure control, adjustment of the mA and/or kV according to patient size and/or use of iterative reconstruction technique. COMPARISON:  No priors. FINDINGS: Cardiovascular: Heart size is normal. There is no significant pericardial fluid, thickening or pericardial calcification. There is aortic atherosclerosis, as well as atherosclerosis of the great vessels of the mediastinum and the coronary arteries, including calcified atherosclerotic plaque in the left anterior descending coronary artery. Mediastinum/Nodes: No pathologically enlarged mediastinal or hilar lymph nodes. Please note that accurate exclusion of hilar adenopathy is limited on noncontrast CT scans. Esophagus is unremarkable in appearance. No axillary lymphadenopathy. Lungs/Pleura: Small pulmonary nodule in the anterior aspect of the left  upper lobe (axial image 120 of series 3), with a volume derived mean diameter of 3.0 mm. No larger more suspicious appearing pulmonary nodules or masses are noted. No acute consolidative airspace disease. No pleural effusions. Mild scarring in the lung bases. Mild diffuse bronchial wall thickening with mild centrilobular and paraseptal emphysema. Upper Abdomen: Mild diffuse low attenuation throughout the visualized hepatic parenchyma, indicative of a background of hepatic steatosis. Musculoskeletal: There are no aggressive appearing lytic or blastic lesions noted in the visualized portions of the skeleton. IMPRESSION: 1. Lung-RADS 2S, benign appearance or behavior. Continue annual screening with low-dose chest CT without contrast in 12 months. 2. The "S" modifier above refers to potentially clinically significant non lung cancer related findings. Specifically, there is aortic atherosclerosis, in addition to left anterior descending coronary artery disease. Please note that although the presence of coronary artery calcium documents the presence of coronary artery disease, the severity of this disease and any potential stenosis cannot be assessed on this non-gated CT examination. Assessment for potential risk factor modification, dietary therapy or pharmacologic therapy may be warranted, if clinically indicated. 3. Mild diffuse bronchial wall thickening with mild centrilobular and paraseptal emphysema; imaging findings suggestive of underlying COPD. 4. Hepatic steatosis. Aortic Atherosclerosis (ICD10-I70.0) and Emphysema (ICD10-J43.9). Electronically Signed   By: Vinnie Langton M.D.   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  Compensated on triple therapy regimen with Trelegy.  Encouraged to remain active.  COPD/asthma action plan in place.  Patient Instructions  Continue Trelegy 1 puff daily. Brush tongue and rinse mouth afterwards Continue Albuterol inhaler 2 puffs every 6 hours as needed for shortness of breath or wheezing. Notify if symptoms persist despite rescue inhaler/neb use. 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 Vaughan Browner or Katie Cloria Ciresi,NP. If symptoms worsen, please contact office for sooner follow up or seek emergency care.    Allergic rhinitis Compensated on current regimen.  Former smoker Lung RADS 2 on LDCT from 09/2022.  Plan for repeat 09/2023.  He is followed by the lung cancer screening program.  Atherosclerosis Incidental finding on CT.  He was diagnosed with HLD at his last cardiology appointment and recommended to start statin therapy.  He declined to do so at the time.  He is going to reevaluate therapy and plans to schedule follow-up with cardiology to discuss this further.     I spent 28 minutes of dedicated to the care of this patient on the date of this encounter to include pre-visit review of records, face-to-face time with the patient discussing conditions above, post visit ordering of testing, clinical documentation with the electronic health record, making appropriate referrals as documented, and communicating necessary  findings to members of the patients care team.  Clayton Bibles, NP 10/28/2022  Pt aware and understands NP's role.

## 2022-10-28 NOTE — Assessment & Plan Note (Signed)
Compensated on current regimen °

## 2022-10-28 NOTE — Assessment & Plan Note (Signed)
Lung RADS 2 on LDCT from 09/2022.  Plan for repeat 09/2023.  He is followed by the lung cancer screening program.

## 2022-10-28 NOTE — Patient Instructions (Addendum)
Continue Trelegy 1 puff daily. Brush tongue and rinse mouth afterwards Continue Albuterol inhaler 2 puffs every 6 hours as needed for shortness of breath or wheezing. Notify if symptoms persist despite rescue inhaler/neb use. 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 Vaughan Browner or Katie Sha Burling,NP. If symptoms worsen, please contact office for sooner follow up or seek emergency care.

## 2022-11-11 ENCOUNTER — Other Ambulatory Visit: Payer: Self-pay | Admitting: Nurse Practitioner

## 2022-11-11 DIAGNOSIS — J441 Chronic obstructive pulmonary disease with (acute) exacerbation: Secondary | ICD-10-CM

## 2022-11-11 DIAGNOSIS — J4540 Moderate persistent asthma, uncomplicated: Secondary | ICD-10-CM

## 2022-12-23 ENCOUNTER — Telehealth: Payer: BC Managed Care – PPO | Admitting: Nurse Practitioner

## 2022-12-23 DIAGNOSIS — J440 Chronic obstructive pulmonary disease with acute lower respiratory infection: Secondary | ICD-10-CM

## 2022-12-23 DIAGNOSIS — J209 Acute bronchitis, unspecified: Secondary | ICD-10-CM | POA: Diagnosis not present

## 2022-12-23 MED ORDER — BENZONATATE 100 MG PO CAPS: 100.0000 mg | ORAL_CAPSULE | Freq: Three times a day (TID) | ORAL | 0 refills | Status: DC | PRN

## 2022-12-23 MED ORDER — AZITHROMYCIN 250 MG PO TABS: ORAL_TABLET | ORAL | 0 refills | Status: AC

## 2022-12-23 NOTE — Progress Notes (Signed)
We are sorry that you are not feeling well.  Here is how we plan to help!  Based on your presentation I believe you most likely have A cough due to bacteria.  When patients have a fever and a productive cough with a change in color or increased sputum production, we are concerned about bacterial bronchitis.  If left untreated it can progress to pneumonia.  If your symptoms do not improve with your treatment plan it is important that you contact your provider.   I have prescribed Azithromyin 250 mg: two tablets now and then one tablet daily for 4 additonal days    In addition you may use A prescription cough medication called Tessalon Perles '100mg'$ . You may take 1-2 capsules every 8 hours as needed for your cough.  It is important to continue to use your inhalers as well throughout this illness   From your responses in the eVisit questionnaire you describe inflammation in the upper respiratory tract which is causing a significant cough.  This is commonly called Bronchitis and has four common causes:   Allergies Viral Infections Acid Reflux Bacterial Infection Allergies, viruses and acid reflux are treated by controlling symptoms or eliminating the cause. An example might be a cough caused by taking certain blood pressure medications. You stop the cough by changing the medication. Another example might be a cough caused by acid reflux. Controlling the reflux helps control the cough.  USE OF BRONCHODILATOR ("RESCUE") INHALERS: There is a risk from using your bronchodilator too frequently.  The risk is that over-reliance on a medication which only relaxes the muscles surrounding the breathing tubes can reduce the effectiveness of medications prescribed to reduce swelling and congestion of the tubes themselves.  Although you feel brief relief from the bronchodilator inhaler, your asthma may actually be worsening with the tubes becoming more swollen and filled with mucus.  This can delay other crucial  treatments, such as oral steroid medications. If you need to use a bronchodilator inhaler daily, several times per day, you should discuss this with your provider.  There are probably better treatments that could be used to keep your asthma under control.     HOME CARE Only take medications as instructed by your medical team. Complete the entire course of an antibiotic. Drink plenty of fluids and get plenty of rest. Avoid close contacts especially the very young and the elderly Cover your mouth if you cough or cough into your sleeve. Always remember to wash your hands A steam or ultrasonic humidifier can help congestion.   GET HELP RIGHT AWAY IF: You develop worsening fever. You become short of breath You cough up blood. Your symptoms persist after you have completed your treatment plan MAKE SURE YOU  Understand these instructions. Will watch your condition. Will get help right away if you are not doing well or get worse.    Thank you for choosing an e-visit.  Your e-visit answers were reviewed by a board certified advanced clinical practitioner to complete your personal care plan. Depending upon the condition, your plan could have included both over the counter or prescription medications.  Please review your pharmacy choice. Make sure the pharmacy is open so you can pick up prescription now. If there is a problem, you may contact your provider through [employer] and have the prescription routed to another pharmacy.  Your safety is important to [place]. If you have drug allergies check your prescription carefully.   For the next 24 hours you can  use MyChart to ask questions about today's visit, request a non-urgent call back, or ask for a work or school excuse. You will get an email in the next two days asking about your experience. I hope that your e-visit has been valuable and will speed your recovery.   I spent approximately 5 minutes reviewing the patient's history, current  symptoms and coordinating their care today.

## 2023-01-14 ENCOUNTER — Other Ambulatory Visit: Payer: Self-pay | Admitting: Nurse Practitioner

## 2023-01-14 DIAGNOSIS — K219 Gastro-esophageal reflux disease without esophagitis: Secondary | ICD-10-CM

## 2023-01-18 DIAGNOSIS — J302 Other seasonal allergic rhinitis: Secondary | ICD-10-CM

## 2023-01-27 MED ORDER — PREDNISONE 10 MG PO TABS: ORAL_TABLET | ORAL | 0 refills | Status: DC

## 2023-01-27 NOTE — Telephone Encounter (Signed)
Please send prednisone taper for COPD/asthma flare - prednisone 10 mg tablets 4 tabs for 2 days, then 3 tabs for 2 days, 2 tabs for 2 days, then 1 tab for 2 days, then stop. If cough becomes more productive or symptoms do not get better, needs to be seen in office. Make sure he's taking his xyzal and singulair too! Can use mucinex OTC for cough/congestion as well. Thanks!

## 2023-01-27 NOTE — Telephone Encounter (Signed)
Received the following message from patient:   "Hey..I have a scheduled appointment with an allergist next week but, I've been kind of wheezing and raspy in my chest, difficult to breath at times. I have been taking my Trelegy everyday. And occasionally the inhaler. Neither seem to be helping. I didn't know if I could get steroid or whatever it was I got awhile back when I was in office. It seemed to help. Thank you!"  I asked the patient if he had a cough or any fevers. This was his response:   "The cough is just from trying to get up phlegm. No fever. Just hard to cough up phlegm..more so in the am. Nose is stopped up some..runny some but attribute this to allergies, especially now. "  Florentina Addison, can you please advise? Thanks!

## 2023-02-01 NOTE — Progress Notes (Unsigned)
NEW PATIENT Date of Service/Encounter:  02/02/23 Referring provider: Deeann Saint, MD Primary care provider: Deeann Saint, MD  Subjective:  [name] is a 52 y.o. male with a PMHx of perforated nasal septum, GERD, arthritis, seasonal allergies, former substance abuse presenting today for evaluation of chronic rhinitis. History obtained from: chart review and patient.   Chronic rhinitis:  Past few years have worsened.  Has had allergies his whole life, felt they were improving until a few years ago.  Originally from [place], but moved to [place] in 1985.  Has constant itchy eyes, stuffy nose, itchy/watery eyes, sneezing.  Throat drainage and soreness.  Year round-both indoor and outdoors. Treatments: taking claritin. Has tried xyzal. Doesn't seem to help.  Considering stinging nettle. Using fluticasone nasal spray occasionally. Carries allergy eye drops.  No prior ENT surgeries.  Gets heart burn, taking pantoprazole once daily. He feels symptoma are controlled with this.   Has COPD-asthma. Somewhat controlled on Trelegy. Recently needed steroid taper due to flare.   Occasionally gets eczema flares on his legs .Doesn't use topical steroids. Happens at beginning of spring. Using hypoallergenic soaps seem to help   Chart Review:  See [facility] for asthma/COPD overlap LV 10/28/22: "TEST/EVENTS:  01/21/2020 CXR 2 view: Mild background emphysema without airspace disease, effusion or pneumothorax. 12/05/2020 PFTs: FVC 78, FEV1 52, ratio 52, TLC 116, DLCOcor 82.  Moderately severe obstructive airway disease with reversibility 04/08/2022 positive RAST (grass pollen, tree pollen, weed pollen), IgE 104 04/27/2022 echocardiogram: EF 60-65, GIDD, " CT 10/07/22. small 3 millimeter nodule. Lung RADS 2. Atherosclerosis.  Symptoms stable on Trelegy.  PCP referral message 01/18/23. Patient interested in AIT, requesting referral.   Other allergy screening: Asthma: no Food  allergy: no Medication allergy: no Hymenoptera allergy: no History of recurrent infections suggestive of immunodeficency: no Vaccinations are up to date.   Past Medical History: Past Medical History:  Diagnosis Date   ADHD (attention deficit hyperactivity disorder)    Asthma    COPD (chronic obstructive pulmonary disease)    GERD (gastroesophageal reflux disease)    Heart murmur    as a child    Medication List:  Current Outpatient Medications  Medication Sig Dispense Refill   albuterol (VENTOLIN HFA) 108 (90 Base) MCG/ACT inhaler INHALE 2 PUFFS INTO THE LUNGS EVERY 4 HOURS AS NEEDED FOR WHEEZING OR SHORTNESS OF BREATH. 8.5 each 2   montelukast (SINGULAIR) 10 MG tablet Take 1 tablet (10 mg total) by mouth at bedtime. 30 tablet 5   pantoprazole (PROTONIX) 40 MG tablet TAKE 1 TABLET BY MOUTH EVERY DAY 30 tablet 1   predniSONE (DELTASONE) 10 MG tablet Take 4 tabs for 2 days, then 3 tabs for 2 days, 2 tabs for 2 days, then 1 tab for 2 days, then stop. 20 tablet 0   TRELEGY ELLIPTA 200-62.5-25 MCG/ACT AEPB TAKE 1 PUFF BY MOUTH EVERY DAY 60 each 5   No current facility-administered medications for this visit.   Known Allergies:  No Known Allergies Past Surgical History: Past Surgical History:  Procedure Laterality Date   cervical fracture repair     SHOULDER ARTHROSCOPY WITH ROTATOR CUFF REPAIR AND SUBACROMIAL DECOMPRESSION Right 02/14/2020   Procedure: SHOULDER ARTHROSCOPY WITH MINI OPEN ROTATOR CUFF REPAIR AND SUBACROMIAL DECOMPRESSION AND DEBRIDEMENT;  Surgeon: Jene Every, MD;  Location: [facility];  Service: Orthopedics;  Laterality: Right;  90 MINS BLOCK OK- NO EXPAREL   Family History: Family History  Problem Relation Age of Onset  COPD Mother    Allergic rhinitis Neg Hx    Angioedema Neg Hx    Asthma Neg Hx    Atopy Neg Hx    Eczema Neg Hx    Immunodeficiency Neg Hx    Urticaria Neg Hx    Social History: [name] lives in an apartment built in [place], + water damage,  wood floors, has gas boiler for heating, window to unit for cooling, dog indoors, no roaches, not using dust mite protection on the bedding, he is exposed to smoking in the car.  Works in [employer] for 15 years.  He is exposed to fumes chemicals or dust at work.  He did smoke 1.5 PPD from 1983 until 2017.   ROS:  All other systems negative except as noted per HPI.  Objective:  Blood pressure 120/70, pulse 93, temperature 98 F (36.7 C), temperature source Temporal, resp. rate 18, height 5\' 6"  (1.676 m), weight 173 lb 1.6 oz (78.5 kg), SpO2 95 %. Body mass index is 27.94 kg/m. Physical Exam:  General Appearance:  Alert, cooperative, no distress, appears stated age  Head:  Normocephalic, without obvious abnormality, atraumatic  Eyes:  Conjunctiva clear, EOM's intact  Nose: Nares normal, normal mucosa and no visible anterior polyps  Throat: Lips, tongue normal; teeth and gums normal, normal posterior oropharynx  Neck: Supple, symmetrical  Lungs:   clear to auscultation bilaterally, Respirations unlabored, intermittent dry coughing  Heart:  regular rate and rhythm and no murmur, Appears well perfused  Extremities: No edema  Skin: Skin color, texture, turgor normal and no rashes or lesions on visualized portions of skin  Neurologic: No gross deficits     Diagnostics: Skin Testing: Environmental allergy panel.  Adequate controls. Results discussed with patient/family.  Airborne Adult Perc - 02/02/23 1034     Time Antigen Placed 1032    Allergen Manufacturer Waynette ButteryGreer    Location Back    Number of Test 59    1. Control-Buffer 50% Glycerol Negative    2. Control-Histamine 1 mg/ml 3+    3. Albumin saline Negative    4. Bahia 4+    5. [place] 4+    6. Johnson 3+    7. [place] Blue 3+    8. Meadow Fescue Negative    9. Perennial Rye 3+    10. Sweet Vernal 3+    11. Timothy 4+    12. Cocklebur Negative    13. Burweed Marshelder Negative    14. Ragweed, short Negative    15.  Ragweed, Giant 3+    16. Plantain,  English 4+    17. Lamb's Quarters 3+    18. Sheep Sorrell 3+    19. Rough Pigweed 3+    20. Marsh Elder, Rough Negative    21. Mugwort, Common Negative    22. Ash mix Negative    23. Birch mix 3+    24. Beech American 3+    25. Box, Elder 2+    26. Cedar, red Negative    27. Cottonwood, Eastern 3+    28. Elm mix 3+    29. Hickory 3+    30. Maple mix Negative    31. Oak, Guinea[place]Biss[place]astern mix 4+    32. Pecan Pollen Negative    33. Pine mix Negative    34. Sycamore Eastern Negative    35. Walnut, Black Pollen 3+    36. Alternaria alternata Negative    37. Cladosporium Herbarum Negative    38. Aspergillus mix Negative  40. Bipolaris sorokiniana (Helminthosporium) Negative    41. Drechslera spicifera (Curvularia) Negative    42. Mucor plumbeus Negative    43. Fusarium moniliforme Negative    44. Aureobasidium pullulans (pullulara) Negative    45. Rhizopus oryzae Negative    46. Botrytis cinera Negative    47. Epicoccum nigrum Negative    48. Phoma betae Negative    49. Candida Albicans Negative    50. Trichophyton mentagrophytes Negative    51. Mite, D Farinae  5,000 AU/ml Negative    52. Mite, D Pteronyssinus  5,000 AU/ml Negative    53. Cat Hair 10,000 BAU/ml Negative    54.  Dog Epithelia Negative    55. Mixed Feathers Negative    56. Horse Epithelia Negative    57. Cockroach, German Negative    58. Mouse Negative    59. Tobacco Leaf 3+             Intradermal - 02/02/23 1100     Time Antigen Placed 1100    Allergen Manufacturer Greer    Location Arm    Number of Test 9    Control Negative    Mold 1 Negative    Mold 2 3+    Mold 3 Negative    Mold 4 Negative    Cat Negative    Dog Negative    Cockroach Negative    Mite mix 4+             Allergy testing results were read and interpreted by myself, documented by clinical staff.  Assessment and Plan  Chronic Rhinitis: determined to be Seasonal and Perennial  Allergic: - allergy testing today: skin testing positive to grass pollen, weed pollen and tree pollen; intradermals positive to indoor molds (aspergillus, penicillium) and dust mites - Prevention:  - allergen avoidance when possible - consider allergy shots as long term control of your symptoms by teaching your immune system to be more tolerant of your allergy triggers - Symptom control: - Start Nasal Steroid Spray: Best results if used daily. - Options include Flonase (fluticasone), Nasocort (triamcinolone), Nasonex (mometasome) 1- 2 sprays in each nostril daily.  - All can be purchased over-the-counter if not covered by insurance. - Start Astelin (azelastine) 1-2 sprays in each nostril twice a day as needed for nasal congestion/itchy nose - Continue Singulair (Montelukast) 10mg  nightly.   - Discontinue if nightmares of behavior changes. - Continue Xyzal (Levocetirinze) 5mg . Can take a maximum of 2 daily for allergies.  - Can be purchased over-the-counter if not covered by insurance.  Allergic Conjunctivitis:  - Consider Allergy Eye drops-great options include Pataday (Olopatadine) or Zaditor (ketotifen) for eye symptoms daily as needed-both sold over the counter if not covered by insurance.  -Avoid eye drops that say red eye relief as they may contain medications that dry out your eyes.   Reflux: - Continue pantoprazole as prescribed.  Asthma/COPD: -Continue follow-up and management with pulmonary -Your breathing must be under excellent control while on allergy immunotherapy.  If having flares please let our office know so that we can hold your allergy shots during those times.  Follow up : 3 months, sooner for allergy injections It was a pleasure meeting you in clinic today! Thank you for allowing me to participate in your care.  This note in its entirety was forwarded to the Provider who requested this consultation.  Thank you for your kind referral. I appreciate the opportunity  to take part in [name]'s care. Please do not  hesitate to contact me with questions.  Sincerely,  Sigurd Sos, MD [facility]

## 2023-02-02 ENCOUNTER — Encounter: Payer: Self-pay | Admitting: Internal Medicine

## 2023-02-02 ENCOUNTER — Ambulatory Visit: Payer: BC Managed Care – PPO | Admitting: Internal Medicine

## 2023-02-02 VITALS — BP 120/70 | HR 93 | Temp 98.0°F | Resp 18 | Ht 66.0 in | Wt 173.1 lb

## 2023-02-02 DIAGNOSIS — H1013 Acute atopic conjunctivitis, bilateral: Secondary | ICD-10-CM

## 2023-02-02 DIAGNOSIS — K219 Gastro-esophageal reflux disease without esophagitis: Secondary | ICD-10-CM

## 2023-02-02 DIAGNOSIS — J3089 Other allergic rhinitis: Secondary | ICD-10-CM | POA: Diagnosis not present

## 2023-02-02 DIAGNOSIS — J302 Other seasonal allergic rhinitis: Secondary | ICD-10-CM

## 2023-02-02 DIAGNOSIS — J4489 Other specified chronic obstructive pulmonary disease: Secondary | ICD-10-CM

## 2023-02-02 MED ORDER — AZELASTINE HCL 0.1 % NA SOLN: 2.0000 | Freq: Two times a day (BID) | NASAL | 5 refills | Status: DC | PRN

## 2023-02-02 MED ORDER — LEVOCETIRIZINE DIHYDROCHLORIDE 5 MG PO TABS: 5.0000 mg | ORAL_TABLET | Freq: Every evening | ORAL | 5 refills | Status: AC

## 2023-02-02 MED ORDER — FLUTICASONE PROPIONATE 50 MCG/ACT NA SUSP: 2.0000 | Freq: Every day | NASAL | 5 refills | Status: DC

## 2023-02-02 MED ORDER — OLOPATADINE HCL 0.2 % OP SOLN: 1.0000 [drp] | Freq: Every day | OPHTHALMIC | 5 refills | Status: DC | PRN

## 2023-02-02 NOTE — Patient Instructions (Signed)
Chronic Rhinitis: determined to be Seasonal and Perennial Allergic: - allergy testing today: skin testing positive to grass pollen, weed pollen and tree pollen; intradermals positive to indoor molds (aspergillus, penicillium) and dust mites - Prevention:  - allergen avoidance when possible - consider allergy shots as long term control of your symptoms by teaching your immune system to be more tolerant of your allergy triggers - Symptom control: - Start Nasal Steroid Spray: Best results if used daily. - Options include Flonase (fluticasone), Nasocort (triamcinolone), Nasonex (mometasome) 1- 2 sprays in each nostril daily.  - All can be purchased over-the-counter if not covered by insurance. - Start Astelin (azelastine) 1-2 sprays in each nostril twice a day as needed for nasal congestion/itchy nose - Continue Singulair (Montelukast) 10mg  nightly.   - Discontinue if nightmares of behavior changes. - Continue Xyzal (Levocetirinze) 5mg . Can take a maximum of 2 daily for allergies.  - Can be purchased over-the-counter if not covered by insurance.  Allergic Conjunctivitis:  - Consider Allergy Eye drops-great options include Pataday (Olopatadine) or Zaditor (ketotifen) for eye symptoms daily as needed-both sold over the counter if not covered by insurance.  -Avoid eye drops that say red eye relief as they may contain medications that dry out your eyes.   Follow up : 3 months, sooner for allergy injections It was a pleasure meeting you in clinic today! Thank you for allowing me to participate in your care.  Tonny Bollman, MD [facility]  Reducing Pollen Exposure  The American Academy of Allergy, Asthma and Immunology suggests the following steps to reduce your exposure to pollen during allergy seasons.    Do not hang sheets or clothing out to dry; pollen may collect on these items. Do not mow lawns or spend time around freshly cut grass; mowing stirs up pollen. Keep windows  closed at night.  Keep car windows closed while driving. Minimize morning activities outdoors, a time when pollen counts are usually at their highest. Stay indoors as much as possible when pollen counts or humidity is high and on windy days when pollen tends to remain in the air longer. Use air conditioning when possible.  Many air conditioners have filters that trap the pollen spores. Use a HEPA room air filter to remove pollen form the indoor air you breathe. DUST MITE AVOIDANCE MEASURES:  There are three main measures that need and can be taken to avoid house dust mites:  Reduce accumulation of dust in general -reduce furniture, clothing, carpeting, books, stuffed animals, especially in bedroom  Separate yourself from the dust -use pillow and mattress encasements (can be found at stores such as [employer] or online) -avoid direct exposure to air condition flow -use a HEPA filter device, especially in the bedroom; you can also use a HEPA filter vacuum cleaner -wipe dust with a moist towel instead of a dry towel or broom when cleaning  Decrease mites and/or their secretions -wash clothing and linen and stuffed animals at highest temperature possible, at least every 2 weeks -stuffed animals can also be placed in a bag and put in a freezer overnight  Despite the above measures, it is impossible to eliminate dust mites or their allergen completely from your home.  With the above measures the burden of mites in your home can be diminished, with the goal of minimizing your allergic symptoms.  Success will be reached only when implementing and using all means together.

## 2023-02-15 ENCOUNTER — Other Ambulatory Visit: Payer: Self-pay | Admitting: Internal Medicine

## 2023-02-15 DIAGNOSIS — J302 Other seasonal allergic rhinitis: Secondary | ICD-10-CM

## 2023-02-15 NOTE — Progress Notes (Signed)
[facility] AIT Rx written.

## 2023-02-17 NOTE — Progress Notes (Signed)
Aeroallergen Immunotherapy  Ordering Provider: Dr. Tonny Bollman  Patient Details Name: [name] MRN: [id number] Date of Birth: [birth year]  Order 2 of 2  Vial Label: M/DM  0.2 ml (Volume)  1:10 Concentration -- Aspergillus mix 0.2 ml (Volume)  1:10 Concentration -- Penicillium mix 0.5 ml (Volume)   AU Concentration -- Mite Mix (DF 5,000 & DP 5,000)   0.9  ml Extract Subtotal 4.1  ml Diluent 5.0  ml Maintenance Total  Schedule:  [facility] Silver Vial (1:1,000,000): [facility] Blue Vial (1:100,000): [facility] Yellow Vial (1:10,000): [facility] Green Vial (1:1,000): Schedule B (6 doses) Red Vial (1:100): Schedule A (10 doses)  Special Instructions: [facility], green B, red A

## 2023-02-17 NOTE — Progress Notes (Signed)
VIALS TO BE MADE CLOSER TO APPT. ?

## 2023-02-17 NOTE — Progress Notes (Signed)
Aeroallergen Immunotherapy   Ordering Provider: Dr. Tonny Bollman   Patient Details  Name: [name]  MRN: [id number]  Date of Birth: 04-08-1971   Order 1 of 2   Vial Label: G/W/T   0.3 ml (Volume)  BAU Concentration -- 7 Grass Mix* 100,000 ([id number] [place], [place], [place], [place] Rye, Red[place], Sweet Vernal, Timothy)  0.2 ml (Volume)  1:20 Concentration -- Bahia  0.3 ml (Volume)  BAU Concentration -- [place] 10,000  0.2 ml (Volume)  1:20 Concentration -- Johnson  0.3 ml (Volume)  1:20 Concentration -- Ragweed Mix  0.2 ml (Volume)  1:10 Concentration -- Plantain English  0.5 ml (Volume)  1:20 Concentration -- Weed Mix*  0.5 ml (Volume)  1:20 Concentration -- Eastern 10 Tree Mix (also Sweet Gum)  0.2 ml (Volume)  1:20 Concentration -- Box Elder  0.2 ml (Volume)  1:20 Concentration -- Walnut, Black Pollen    2.9  ml Extract Subtotal  2.1  ml Diluent  5.0  ml Maintenance Total   Schedule:  [facility]  Silver Vial (1:1,000,000): [facility]  Blue Vial (1:100,000): [facility]  Yellow Vial (1:10,000): [facility]  Green Vial (1:1,000): Schedule B (6 doses)  Red Vial (1:100): Schedule A (10 doses)   Special Instructions: [facility], green B, red A

## 2023-02-23 NOTE — Progress Notes (Signed)
VIALS EXP 02-27-24 

## 2023-02-27 ENCOUNTER — Encounter: Payer: Self-pay | Admitting: Nurse Practitioner

## 2023-02-27 ENCOUNTER — Ambulatory Visit: Payer: BC Managed Care – PPO | Admitting: Nurse Practitioner

## 2023-02-27 VITALS — BP 132/70 | HR 96 | Temp 98.2°F | Ht 66.0 in | Wt 181.4 lb

## 2023-02-27 DIAGNOSIS — J302 Other seasonal allergic rhinitis: Secondary | ICD-10-CM

## 2023-02-27 DIAGNOSIS — J3089 Other allergic rhinitis: Secondary | ICD-10-CM | POA: Diagnosis not present

## 2023-02-27 DIAGNOSIS — J301 Allergic rhinitis due to pollen: Secondary | ICD-10-CM | POA: Diagnosis not present

## 2023-02-27 DIAGNOSIS — J449 Chronic obstructive pulmonary disease, unspecified: Secondary | ICD-10-CM | POA: Diagnosis not present

## 2023-02-27 NOTE — Assessment & Plan Note (Signed)
Starting allergy shots. Continue singulair, claritin, astelin, and flonase. Follow up with allergist as scheduled.

## 2023-02-27 NOTE — Progress Notes (Signed)
@Patient  ID: [name], male    DOB: 1971-09-20, 52 y.o.   MRN: [id number]  Chief Complaint  Patient presents with   Follow-up    States feels some wheezing at times, DOE    Referring provider: Deeann Saint, MD  HPI: 52 year old male, former smoker followed for poorly controlled COPD/asthma.  He is a patient of Dr. Shirlee More and last seen in office 10/28/2021 by Allison Quarry NP.  Past medical history for perforated nasal septum, GERD, arthritis, seasonal allergies, former substance abuse.  TEST/EVENTS:  01/21/2020 CXR 2 view: Mild background emphysema without airspace disease, effusion or pneumothorax. 12/05/2020 PFTs: FVC 78, FEV1 52, ratio 52, TLC 116, DLCOcor 82.  Moderately severe obstructive airway disease with reversibility 04/08/2022 positive RAST, IgE 104 04/27/2022 echocardiogram: EF 60-65, GIDD,  10/07/2022 LDCT chest: atherosclerosis. No LAD. Small nodule in LUL, 3 mm. Mild scarring at lung bases. Mild diffuse bronchial wall thickening with mild emphysema. Lung RADS 2S. Hepatic steatosis.   08/03/2020: [name] with Randall Ridges NP.  Compliant with Trelegy but will occasionally be without prescription for 2 to 3 days before filling and notice a difference in his asthma symptoms.  Reports that his chest tightness and cough are typically worse in the Randall.  Did have some wheezing at OV.  Cough is productive with clear mucus.  Experiencing 2-3 exacerbations on average in 6 months.  Stepped up to Trelegy 200 and added Singulair at bedtime.  Treated with prednisone taper.  Recommended that he follow-up in 6 months or sooner if no improvement.  03/25/2022: OV with Randall Halberg NP for overdue follow-up and is actually having acute symptoms as well.  He has had issues with his breathing over the last few months.  He has been trying to space out his Trelegy so he did not run out of it.  He has now been off of it for the past 3 to 4 weeks.  Has noticed a increase in shortness of breath, increase in productive  cough and wheezing since.  He also has significant allergy type symptoms and chronic nasal congestion.  He does have a known septal perforation and he is anticipated to see ENT later this month.  He has also been having issues with feeling very full after he eats and like things were sitting in his chest.  He can eat a very small meal and feels like he ate a four course dinner.  He also reports that he has had episodes where he feels like his heart is fluttering or racing. FeNO elevated to 36. Poor control seemed to be related to inconsistent use of Trelegy; does much better when he is on it. Treated with depo, pred taper and empiric doxy course for AECOPD/asthma. Restart trelegy. Started him on Xyzal for allergies and protonix for GERD. Check CBC with diff - eos 0 and allergen panel - never drawn. Referred to cardiology d/t palpitations.   04/08/2022: OV with Randall Cremeens NP for follow-up after being treated for AECOPD/asthma.  He reports feeling significantly better after prednisone and Doxy courses last week, and restarting his Trelegy inhaler.  His cough has mostly resolved and he has not noticed any more wheezing.  Activity tolerance has significantly improved.  He is able to climb stairs without difficulties.  He did see cardiology and is going to be wearing a cardiac monitor for 2 weeks and undergo echocardiogram for further evaluation of his palpitations.  He never started on Xyzal and allergen panel was never drawn after last visit.  Feels like allergies are okay and have improved some since her last visit.  He did start on Protonix and feels like this is helped with his GERD symptoms.  No concerns or complaints today. Please notify patient that his allergen panel came back. He has multiple environmental allergies, mostly to trees/plants. He has a very low allergy response to dog dander as well.  07/11/2022: OV with Randall Burgher NP for follow up. He was doing well since our last visit up until the last few weeks to a  month. He's noticed he becomes short of breath more easily and he's wheezing more. Acute exacerbation with elevated exhaled nitric oxide. Prednisone burst 40 mg for 5 days. Advised he use mucinex for chest congestion/mucolytic therapy. Continue prn albuterol; ok to use 15 min prior to activity to help with endurance. We will check cbc with diff today to assess eosinophils. May need to consider addition of singulair. We also discussed biologic therapy as a potential treatment option in the future if he continues to have poor control of his asthma/COPD  07/28/2022: OV with Randall Yim NP today for follow up. He reports feeling better since he was here last. He feels like the prednisone course helped. He still occasionally has some chest congestion and cough, but this is improved. His breathing is stable. He denies noticing much wheezing. He's using his Trelegy daily. Takes Xyzal daily for allergies.   10/28/2022: OV with Randall Faust NP for follow-up.  His breathing has been stable since he was here last.  Able to do things around the house that any difficulty.  He does find that he gets short winded if he has to carry things up the stairs of their apartment.  Otherwise does not have to use his rescue inhaler much.  Cough waxes and wanes.  Occasionally productive and other days does not really have much of 1.  Denies any increased chest congestion or wheezing.  He is using his Trelegy daily.  Still takes either Xyzal or Claritin for allergies.  Using Singulair at night.  He did have low-dose lung cancer screening CT 12/15.  He had a small 3 millimeter nodule.  Lung RADS 2.  He did have atherosclerosis.  He does have a cardiologist.  He was told to start on a statin the last time he was seen there.  He never did this.  He plans to go back for follow-up appointment with them.  No chest pain.  Has not noticed as many palpitations recently.  02/27/2023: Today - follow up Patient presents today for follow up with his girlfriend. He had  a flare in early April 2024 requiring steroids. He has since recovered. He saw his allergist, [name], 4/11 for initial consult, and is preparing to start allergy shots. He feels like his breathing is doing okay. Hasn't noticed much wheezing since he completed the steroids. Cough still waxes and wanes, which is his baseline. Using his trelegy daily. Not using albuterol frequently. Taking singulair and claritin for allergies. Next LDCT chest is due December 2024. No concerns or complaints today.   No Known Allergies  Immunization History  Administered Date(s) Administered   Influenza,inj,Quad PF,6+ Mos 07/05/2017, 09/01/2018   PFIZER(Purple Top)SARS-COV-2 Vaccination 12/21/2019, 01/11/2020   Tdap 01/13/2021    Past Medical History:  Diagnosis Date   ADHD (attention deficit hyperactivity disorder)    Asthma    COPD (chronic obstructive pulmonary disease) (HCC)    GERD (gastroesophageal reflux disease)    Heart murmur    as  a child     Tobacco History: Social History   Tobacco Use  Smoking Status Former   Packs/day: 1.00   Years: 37.00   Additional pack years: 0.00   Total pack years: 37.00   Types: Cigarettes   Quit date: 2017   Years since quitting: 7.3   Passive exposure: Past  Smokeless Tobacco Never   Counseling given: Not Answered   Outpatient Medications Prior to Visit  Medication Sig Dispense Refill   albuterol (VENTOLIN HFA) 108 (90 Base) MCG/ACT inhaler INHALE 2 PUFFS INTO THE LUNGS EVERY 4 HOURS AS NEEDED FOR WHEEZING OR SHORTNESS OF BREATH. 8.5 each 2   azelastine (ASTELIN) 0.1 % nasal spray Place 2 sprays into both nostrils 2 (two) times daily as needed for rhinitis. Use in each nostril as directed 30 mL 5   fluticasone (FLONASE) 50 MCG/ACT nasal spray Place 2 sprays into both nostrils daily. 16 g 5   levocetirizine (XYZAL) 5 MG tablet Take 1 tablet (5 mg total) by mouth every evening. 30 tablet 5   montelukast (SINGULAIR) 10 MG tablet Take 1 tablet (10 mg  total) by mouth at bedtime. 30 tablet 5   Olopatadine HCl (PATADAY) 0.2 % SOLN Place 1 drop into both eyes daily as needed. 2.5 mL 5   pantoprazole (PROTONIX) 40 MG tablet TAKE 1 TABLET BY MOUTH EVERY DAY 30 tablet 1   TRELEGY ELLIPTA 200-62.5-25 MCG/ACT AEPB TAKE 1 PUFF BY MOUTH EVERY DAY 60 each 5   predniSONE (DELTASONE) 10 MG tablet Take 4 tabs for 2 days, then 3 tabs for 2 days, 2 tabs for 2 days, then 1 tab for 2 days, then stop. 20 tablet 0   No facility-administered medications prior to visit.     Review of Systems:   Constitutional: No weight loss or gain, night sweats, fevers, chills, fatigue, or lassitude. HEENT: No headaches, difficulty swallowing, tooth/dental problems, or sore throat. No itching, ear ache +nasal congestion, sneezing, frequent nasal clearing (improved) CV:    No chest pain, orthopnea, PND, palpitations, swelling in lower extremities, anasarca, dizziness, syncope Resp: +shortness of breath with activity; occasional productive cough. No wheezing. No hemoptysis.  No chest wall deformity GI:   No heartburn, indigestion, abdominal pain, nausea, vomiting, diarrhea, change in bowel habits, loss of appetite, bloody stools.  MSK:  No joint pain or swelling.  No decreased range of motion.  No back pain. Neuro: No dizziness or lightheadedness.  Psych: No depression or anxiety. Mood stable.     Physical Exam:  BP 132/70 (BP Location: Right Arm, Patient Position: Sitting, Cuff Size: Normal)   Pulse 96   Temp 98.2 F (36.8 C) (Oral)   Ht 5\' 6"  (1.676 m)   Wt 181 lb 6.4 oz (82.3 kg)   SpO2 98% Comment: RA  BMI 29.28 kg/m   GEN: Pleasant, interactive, well-appearing; in no acute distress. HEENT:  Normocephalic and atraumatic. PERRLA. Sclera white. Nasal turbinates pink, moist.  Perforated septum.  No rhinorrhea present. Oropharynx pink and moist, without exudate or edema. No lesions, ulcerations, or postnasal drip.  NECK:  Supple w/ fair ROM. No JVD present. Normal  carotid impulses w/o bruits. Thyroid symmetrical with no goiter or nodules palpated. No lymphadenopathy.   CV: RRR, no m/r/g, no peripheral edema. Pulses intact, +2 bilaterally. No cyanosis, pallor or clubbing. PULMONARY:  Unlabored, regular breathing.  Clear bilaterally A&P w/o wheezes/rales/rhonchi. No accessory muscle use. No dullness to percussion. GI: BS present and normoactive. Soft, non-tender to palpation. No organomegaly  or masses detected.  MSK: No erythema, warmth or tenderness. No deformities or joint swelling noted.  Neuro: A/Ox3. No focal deficits noted.   Skin: Warm, no lesions or rashe Psych: Normal affect and behavior. Judgement and thought content appropriate.     Lab Results:  CBC    Component Value Date/Time   WBC 8.9 07/11/2022 1636   RBC 4.29 07/11/2022 1636   HGB 13.5 07/11/2022 1636   HGB 13.9 04/07/2022 0924   HCT 39.8 07/11/2022 1636   HCT 40.2 04/07/2022 0924   PLT 292.0 07/11/2022 1636   PLT 268 04/07/2022 0924   MCV 92.7 07/11/2022 1636   MCV 89 04/07/2022 0924   MCH 30.6 04/07/2022 0924   MCH 31.3 02/13/2020 0813   MCHC 33.9 07/11/2022 1636   RDW 14.6 07/11/2022 1636   RDW 13.2 04/07/2022 0924   LYMPHSABS 2.7 07/11/2022 1636   MONOABS 0.9 07/11/2022 1636   EOSABS 0.1 07/11/2022 1636   BASOSABS 0.0 07/11/2022 1636    BMET    Component Value Date/Time   NA 140 04/07/2022 0924   K 4.6 04/07/2022 0924   CL 103 04/07/2022 0924   CO2 23 04/07/2022 0924   GLUCOSE 98 04/07/2022 0924   GLUCOSE 98 02/13/2020 0813   BUN 22 04/07/2022 0924   CREATININE 0.93 04/07/2022 0924   CALCIUM 9.2 04/07/2022 0924   GFRNONAA >60 02/13/2020 0813   GFRAA >60 02/13/2020 0813    BNP No results found for: "BNP"   Imaging:  No results found.       Latest Ref Rng & Units 12/05/2018   11:42 AM  PFT Results  FVC-Pre L 3.10   FVC-Predicted Pre % 71   FVC-Post L 3.43   FVC-Predicted Post % 78   Pre FEV1/FVC % % 51   Post FEV1/FCV % % 52   FEV1-Pre L  1.57   FEV1-Predicted Pre % 46   FEV1-Post L 1.77   DLCO uncorrected ml/min/mmHg 21.01   DLCO UNC% % 82   DLCO corrected ml/min/mmHg 21.25   DLCO COR %Predicted % 82   DLVA Predicted % 83   TLC L 7.04   TLC % Predicted % 116   RV % Predicted % 206     Lab Results  Component Value Date   NITRICOXIDE 20 11/19/2018        Assessment & Plan:   COPD mixed type (HCC) COPD/asthma overlap with significant allergic component. He was treated for exacerbation April 2024. Last steroid course prior to this was September 2023. Tends to flare during allergy seasons. Discussed that if he has another exacerbation this year, may consider biologic therapy but for now, will see how he does with initiation of allergy shots. No change to current regimen. Action plan in place.  Patient Instructions  Continue Trelegy 1 puff daily. Brush tongue and rinse mouth afterwards Continue Albuterol inhaler 2 puffs every 6 hours as needed for shortness of breath or wheezing. Notify if symptoms persist despite rescue inhaler/neb use. Continue Xyzal 5 mg or claritin 10 mg daily for allergies Continue Singulair 10 mg At bedtime for allergy/asthma  Continue nasal sprays    Follow up with allergist and start allergy shots as scheduled    Follow up in 4 months with [name] or Randall Maryanna Stuber,NP. If symptoms worsen, please contact office for sooner follow up or seek emergency care.    Seasonal and perennial allergic rhinitis Starting allergy shots. Continue singulair, claritin, astelin, and flonase. Follow up with allergist  as scheduled.       I spent 28 minutes of dedicated to the care of this patient on the date of this encounter to include pre-visit review of records, face-to-face time with the patient discussing conditions above, post visit ordering of testing, clinical documentation with the electronic health record, making appropriate referrals as documented, and communicating necessary findings to members  of the patients care team.  Noemi Chapel, NP 02/27/2023  Pt aware and understands NP's role.

## 2023-02-27 NOTE — Assessment & Plan Note (Signed)
COPD/asthma overlap with significant allergic component. He was treated for exacerbation April 2024. Last steroid course prior to this was September 2023. Tends to flare during allergy seasons. Discussed that if he has another exacerbation this year, may consider biologic therapy but for now, will see how he does with initiation of allergy shots. No change to current regimen. Action plan in place.  Patient Instructions  Continue Trelegy 1 puff daily. Brush tongue and rinse mouth afterwards Continue Albuterol inhaler 2 puffs every 6 hours as needed for shortness of breath or wheezing. Notify if symptoms persist despite rescue inhaler/neb use. Continue Xyzal 5 mg or claritin 10 mg daily for allergies Continue Singulair 10 mg At bedtime for allergy/asthma  Continue nasal sprays    Follow up with allergist and start allergy shots as scheduled    Follow up in 4 months with Dr. Isaiah Woods or Randall Dennise Raabe,NP. If symptoms worsen, please contact office for sooner follow up or seek emergency care.

## 2023-02-27 NOTE — Patient Instructions (Addendum)
Continue Trelegy 1 puff daily. Brush tongue and rinse mouth afterwards Continue Albuterol inhaler 2 puffs every 6 hours as needed for shortness of breath or wheezing. Notify if symptoms persist despite rescue inhaler/neb use. Continue Xyzal 5 mg or claritin 10 mg daily for allergies Continue Singulair 10 mg At bedtime for allergy/asthma  Continue nasal sprays    Follow up with allergist and start allergy shots as scheduled    Follow up in 4 months with Dr. Isaiah Serge or Katie Yaacov Koziol,NP. If symptoms worsen, please contact office for sooner follow up or seek emergency care.

## 2023-02-28 DIAGNOSIS — J3089 Other allergic rhinitis: Secondary | ICD-10-CM | POA: Diagnosis not present

## 2023-03-17 ENCOUNTER — Other Ambulatory Visit: Payer: Self-pay

## 2023-03-17 MED ORDER — PREDNISONE 20 MG PO TABS: ORAL_TABLET | ORAL | 0 refills | Status: DC

## 2023-03-17 MED ORDER — EPINEPHRINE 0.3 MG/0.3ML IJ SOAJ: 0.3000 mg | INTRAMUSCULAR | 1 refills | Status: DC | PRN

## 2023-03-17 MED ORDER — FAMOTIDINE 20 MG PO TABS: ORAL_TABLET | ORAL | 0 refills | Status: DC

## 2023-03-17 NOTE — Telephone Encounter (Signed)
Pre-meds sent to [facility], pt notify via mychart and call.

## 2023-03-20 ENCOUNTER — Other Ambulatory Visit: Payer: Self-pay | Admitting: Nurse Practitioner

## 2023-03-20 DIAGNOSIS — K219 Gastro-esophageal reflux disease without esophagitis: Secondary | ICD-10-CM

## 2023-03-21 ENCOUNTER — Other Ambulatory Visit: Payer: Self-pay | Admitting: Internal Medicine

## 2023-03-21 ENCOUNTER — Ambulatory Visit: Payer: BC Managed Care – PPO | Admitting: Internal Medicine

## 2023-03-21 VITALS — BP 132/74 | HR 78 | Temp 97.8°F | Resp 18

## 2023-03-21 DIAGNOSIS — J309 Allergic rhinitis, unspecified: Secondary | ICD-10-CM

## 2023-03-21 DIAGNOSIS — J302 Other seasonal allergic rhinitis: Secondary | ICD-10-CM

## 2023-03-21 DIAGNOSIS — J3089 Other allergic rhinitis: Secondary | ICD-10-CM

## 2023-03-21 MED ORDER — EPINEPHRINE 0.3 MG/0.3ML IJ SOAJ: 0.3000 mg | INTRAMUSCULAR | 1 refills | Status: DC | PRN

## 2023-03-21 NOTE — Progress Notes (Unsigned)
RAPID DESENSITIZATION Note  RE: [name] MRN: [id number] DOB: 30-Aug-1971 Date of Office Visit: 03/21/2023  Subjective:  Patient presents today for rapid desensitization.  Interval History: Patient has not been ill, he has taken all premedications as per protocol.  Recent/Current History: Pulmonary disease: no Cardiac disease: no Respiratory infection: no Rash: no Itch: no Swelling: no Cough: no Shortness of breath: no Runny/stuffy nose: no Itchy eyes: no Beta-blocker use: no  Patient/guardian was informed of the procedure with verbalized understanding of the risk of anaphylaxis. Consent has been signed.   Medication List:  Current Outpatient Medications  Medication Sig Dispense Refill   EPINEPHrine 0.3 mg/0.3 mL IJ SOAJ injection Inject 0.3 mg into the muscle as needed for anaphylaxis. 1 each 1   albuterol (VENTOLIN HFA) 108 (90 Base) MCG/ACT inhaler INHALE 2 PUFFS INTO THE LUNGS EVERY 4 HOURS AS NEEDED FOR WHEEZING OR SHORTNESS OF BREATH. 8.5 each 2   azelastine (ASTELIN) 0.1 % nasal spray Place 2 sprays into both nostrils 2 (two) times daily as needed for rhinitis. Use in each nostril as directed 30 mL 5   famotidine (PEPCID) 20 MG tablet Take 1 tab twice daily day before and day of RUSH appt 4 tablet 0   fluticasone (FLONASE) 50 MCG/ACT nasal spray Place 2 sprays into both nostrils daily. 16 g 5   levocetirizine (XYZAL) 5 MG tablet Take 1 tablet (5 mg total) by mouth every evening. 30 tablet 5   montelukast (SINGULAIR) 10 MG tablet Take 1 tablet (10 mg total) by mouth at bedtime. 30 tablet 5   Olopatadine HCl (PATADAY) 0.2 % SOLN Place 1 drop into both eyes daily as needed. 2.5 mL 5   pantoprazole (PROTONIX) 40 MG tablet TAKE 1 TABLET BY MOUTH EVERY DAY 30 tablet 1   predniSONE (DELTASONE) 20 MG tablet Take 1 tabs morning day before & morning of RUSH appt. 2 tablet 0   TRELEGY ELLIPTA 200-62.5-25 MCG/ACT AEPB TAKE 1 PUFF BY MOUTH EVERY DAY 60 each 5   No  current facility-administered medications for this visit.   Allergies: No Known Allergies I reviewed his past medical history, social history, family history, and environmental history and no significant changes have been reported from his previous visit.  ROS: Negative except as per HPI.  Objective: There were no vitals taken for this visit. There is no height or weight on file to calculate BMI.   General Appearance:  Alert, cooperative, no distress, appears stated age  Head:  Normocephalic, without obvious abnormality, atraumatic  Eyes:  Conjunctiva clear, EOM's intact  Nose: Nares normal  Throat: Lips, tongue normal; teeth and gums normal, normal posterior oropharnyx  Neck: Supple, symmetrical  Lungs:   CTAB, Respirations unlabored, no coughing  Heart:  Appears well perfused  Extremities: No edema  Skin: Skin color, texture, turgor normal, no rashes or lesions on visualized portions of skin  Neurologic: No gross deficits     Diagnostics:   PROCEDURES:  Patient received the following doses every hour: Step 1:  0.34ml - 1:1,000,000 dilution (silver vial) Step 2:  0.95ml - 1:1,000,000 dilution (silver vial) Step 3: 0.30ml - 1:100,000 dilution (blue vial)  Step 4: 0.46ml - 1:100,000 dilution (blue vial)  Step 5: 0.43ml - 1:10,000 dilution (gold vial) Step 6: 0.60ml - 1:10,000 dilution (gold vial) Step 7: 0.5ml - 1:10,000 dilution (gold vial) Step 8: 0.38ml - 1:10,000 dilution (gold vial)  Patient was observed for 1 hour after the last dose.   Procedure started  at *** Procedure ended at ***   ASSESSMENT/PLAN:   Patient has tolerated the rapid desensitization protocol.  Next appointment: Start at 0.21ml of 1:1000 dilution (green vial) and build up per protocol.

## 2023-03-24 NOTE — Telephone Encounter (Signed)
This is a large local reaction which can happen with RUSH and is often anticipated.  He should double his antihistamines until symptoms resolve, but can continue with AIT as planned.  He should also take pantoprazole as prescribed.  Thanks!

## 2023-03-25 ENCOUNTER — Other Ambulatory Visit: Payer: Self-pay | Admitting: Nurse Practitioner

## 2023-03-25 DIAGNOSIS — J4489 Other specified chronic obstructive pulmonary disease: Secondary | ICD-10-CM

## 2023-03-28 ENCOUNTER — Ambulatory Visit (INDEPENDENT_AMBULATORY_CARE_PROVIDER_SITE_OTHER): Payer: BC Managed Care – PPO | Admitting: *Deleted

## 2023-03-28 ENCOUNTER — Other Ambulatory Visit: Payer: Self-pay | Admitting: Nurse Practitioner

## 2023-03-28 DIAGNOSIS — J309 Allergic rhinitis, unspecified: Secondary | ICD-10-CM | POA: Diagnosis not present

## 2023-03-28 DIAGNOSIS — J441 Chronic obstructive pulmonary disease with (acute) exacerbation: Secondary | ICD-10-CM

## 2023-03-28 DIAGNOSIS — J4540 Moderate persistent asthma, uncomplicated: Secondary | ICD-10-CM

## 2023-04-05 ENCOUNTER — Ambulatory Visit (INDEPENDENT_AMBULATORY_CARE_PROVIDER_SITE_OTHER): Payer: BC Managed Care – PPO

## 2023-04-05 DIAGNOSIS — J309 Allergic rhinitis, unspecified: Secondary | ICD-10-CM

## 2023-04-13 ENCOUNTER — Ambulatory Visit (INDEPENDENT_AMBULATORY_CARE_PROVIDER_SITE_OTHER): Payer: BC Managed Care – PPO

## 2023-04-13 DIAGNOSIS — J309 Allergic rhinitis, unspecified: Secondary | ICD-10-CM

## 2023-04-20 ENCOUNTER — Ambulatory Visit (INDEPENDENT_AMBULATORY_CARE_PROVIDER_SITE_OTHER): Payer: BC Managed Care – PPO

## 2023-04-20 DIAGNOSIS — J309 Allergic rhinitis, unspecified: Secondary | ICD-10-CM | POA: Diagnosis not present

## 2023-04-24 ENCOUNTER — Ambulatory Visit (INDEPENDENT_AMBULATORY_CARE_PROVIDER_SITE_OTHER): Payer: BC Managed Care – PPO | Admitting: Allergy

## 2023-04-24 DIAGNOSIS — J309 Allergic rhinitis, unspecified: Secondary | ICD-10-CM | POA: Diagnosis not present

## 2023-05-03 ENCOUNTER — Ambulatory Visit (INDEPENDENT_AMBULATORY_CARE_PROVIDER_SITE_OTHER): Payer: BC Managed Care – PPO

## 2023-05-03 DIAGNOSIS — J309 Allergic rhinitis, unspecified: Secondary | ICD-10-CM

## 2023-05-08 ENCOUNTER — Other Ambulatory Visit: Payer: Self-pay | Admitting: Nurse Practitioner

## 2023-05-08 DIAGNOSIS — K219 Gastro-esophageal reflux disease without esophagitis: Secondary | ICD-10-CM

## 2023-05-10 ENCOUNTER — Ambulatory Visit (INDEPENDENT_AMBULATORY_CARE_PROVIDER_SITE_OTHER): Payer: BC Managed Care – PPO | Admitting: *Deleted

## 2023-05-10 DIAGNOSIS — J309 Allergic rhinitis, unspecified: Secondary | ICD-10-CM | POA: Diagnosis not present

## 2023-05-17 ENCOUNTER — Ambulatory Visit (INDEPENDENT_AMBULATORY_CARE_PROVIDER_SITE_OTHER): Payer: BC Managed Care – PPO | Admitting: *Deleted

## 2023-05-17 DIAGNOSIS — J309 Allergic rhinitis, unspecified: Secondary | ICD-10-CM

## 2023-05-24 ENCOUNTER — Ambulatory Visit (INDEPENDENT_AMBULATORY_CARE_PROVIDER_SITE_OTHER): Payer: BC Managed Care – PPO

## 2023-05-24 DIAGNOSIS — J309 Allergic rhinitis, unspecified: Secondary | ICD-10-CM | POA: Diagnosis not present

## 2023-06-01 ENCOUNTER — Ambulatory Visit (INDEPENDENT_AMBULATORY_CARE_PROVIDER_SITE_OTHER): Payer: BC Managed Care – PPO

## 2023-06-01 DIAGNOSIS — J309 Allergic rhinitis, unspecified: Secondary | ICD-10-CM | POA: Diagnosis not present

## 2023-06-08 ENCOUNTER — Ambulatory Visit (INDEPENDENT_AMBULATORY_CARE_PROVIDER_SITE_OTHER): Payer: BC Managed Care – PPO | Admitting: *Deleted

## 2023-06-08 DIAGNOSIS — J309 Allergic rhinitis, unspecified: Secondary | ICD-10-CM | POA: Diagnosis not present

## 2023-06-14 ENCOUNTER — Ambulatory Visit: Payer: Self-pay | Admitting: *Deleted

## 2023-06-14 DIAGNOSIS — J309 Allergic rhinitis, unspecified: Secondary | ICD-10-CM

## 2023-06-21 ENCOUNTER — Ambulatory Visit: Payer: Self-pay | Admitting: *Deleted

## 2023-06-21 DIAGNOSIS — J309 Allergic rhinitis, unspecified: Secondary | ICD-10-CM

## 2023-06-21 MED ORDER — EPINEPHRINE 0.3 MG/0.3ML IJ SOAJ: 0.3000 mg | Freq: Once | INTRAMUSCULAR | Status: AC

## 2023-06-21 NOTE — Addendum Note (Signed)
Addended by: Florence Canner on: 06/21/2023 05:14 PM   Modules accepted: Orders

## 2023-06-22 ENCOUNTER — Telehealth: Payer: Self-pay | Admitting: *Deleted

## 2023-06-22 NOTE — Telephone Encounter (Signed)
Patient came in for his allergy injections yesterday and received 0.39mL of his first Red vials. He came back about 15 minutes later experiencing itching in in his hands and feet and dizziness. .30mg  epinephrine was administered and Xyzal. Patient left at 4:59pm in stable condition. I called the patient today to see how he was doing, he stated he felt fine and has not had any issues since he left the office yesterday. He did state that he has not been taking his allergy medication lately, especially on his injection days. He did state that he will resume taking them. Please advise where you may want to back him down for his next injection.

## 2023-06-23 NOTE — Telephone Encounter (Signed)
Patient's allergy flow sheet has been updated to reflect these changes.

## 2023-06-23 NOTE — Telephone Encounter (Signed)
Recommend reducing him to red 0.55ml can build up on schedule A.  Thanks

## 2023-06-28 ENCOUNTER — Ambulatory Visit (INDEPENDENT_AMBULATORY_CARE_PROVIDER_SITE_OTHER): Payer: BC Managed Care – PPO | Admitting: *Deleted

## 2023-06-28 DIAGNOSIS — J309 Allergic rhinitis, unspecified: Secondary | ICD-10-CM | POA: Diagnosis not present

## 2023-06-29 ENCOUNTER — Encounter: Payer: Self-pay | Admitting: Nurse Practitioner

## 2023-06-29 ENCOUNTER — Ambulatory Visit: Payer: BC Managed Care – PPO | Admitting: Nurse Practitioner

## 2023-06-29 VITALS — BP 130/80 | HR 91 | Ht 66.0 in | Wt 183.4 lb

## 2023-06-29 DIAGNOSIS — J3089 Other allergic rhinitis: Secondary | ICD-10-CM

## 2023-06-29 DIAGNOSIS — J441 Chronic obstructive pulmonary disease with (acute) exacerbation: Secondary | ICD-10-CM

## 2023-06-29 DIAGNOSIS — J449 Chronic obstructive pulmonary disease, unspecified: Secondary | ICD-10-CM | POA: Diagnosis not present

## 2023-06-29 DIAGNOSIS — J45901 Unspecified asthma with (acute) exacerbation: Secondary | ICD-10-CM

## 2023-06-29 MED ORDER — ALBUTEROL SULFATE (2.5 MG/3ML) 0.083% IN NEBU
2.5000 mg | INHALATION_SOLUTION | Freq: Four times a day (QID) | RESPIRATORY_TRACT | 5 refills | Status: DC | PRN
Start: 2023-06-29 — End: 2024-03-14

## 2023-06-29 MED ORDER — PREDNISONE 10 MG PO TABS
ORAL_TABLET | ORAL | 0 refills | Status: DC
Start: 2023-06-29 — End: 2023-07-20

## 2023-06-29 MED ORDER — METHYLPREDNISOLONE ACETATE 80 MG/ML IJ SUSP
80.0000 mg | Freq: Once | INTRAMUSCULAR | Status: DC
Start: 2023-06-29 — End: 2023-12-25

## 2023-06-29 NOTE — Progress Notes (Signed)
@Patient  ID: [name], male    DOB: [birth year], 52 y.o.   MRN: [id number]  Chief Complaint  Patient presents with   Follow-up    Pt is here for COPD F/U visit. Pt complains of SOB and Chest Tightness for 2 day.    Referring provider: Deeann Saint, MD  HPI: 52 year old male, former smoker followed for poorly controlled COPD/asthma.  He is a patient of Dr. Shirlee More and last seen in office 02/27/2023 by [facility] NP.  Past medical history for perforated nasal septum, GERD, arthritis, seasonal allergies, former substance abuse.  TEST/EVENTS:  01/21/2020 CXR 2 view: Mild background emphysema without airspace disease, effusion or pneumothorax. 12/05/2020 PFTs: FVC 78, FEV1 52, ratio 52, TLC 116, DLCOcor 82.  Moderately severe obstructive airway disease with reversibility 04/08/2022 positive RAST, IgE 104 04/27/2022 echocardiogram: EF 60-65, GIDD,  10/07/2022 LDCT chest: atherosclerosis. No LAD. Small nodule in LUL, 3 mm. Mild scarring at lung bases. Mild diffuse bronchial wall thickening with mild emphysema. Lung RADS 2S. Hepatic steatosis.   08/03/2020: Sudie Grumbling with Clent Ridges NP.  Compliant with Trelegy but will occasionally be without prescription for 2 to 3 days before filling and notice a difference in his asthma symptoms.  Reports that his chest tightness and cough are typically worse in the morning.  Did have some wheezing at OV.  Cough is productive with clear mucus.  Experiencing 2-3 exacerbations on average in 6 months.  Stepped up to Trelegy 200 and added Singulair at bedtime.  Treated with prednisone taper.  Recommended that he follow-up in 6 months or sooner if no improvement.  03/25/2022: OV with Gola Bribiesca NP for overdue follow-up and is actually having acute symptoms as well.  He has had issues with his breathing over the last few months.  He has been trying to space out his Trelegy so he did not run out of it.  He has now been off of it for the past 3 to 4 weeks.  Has noticed a increase in  shortness of breath, increase in productive cough and wheezing since.  He also has significant allergy type symptoms and chronic nasal congestion.  He does have a known septal perforation and he is anticipated to see ENT later this month.  He has also been having issues with feeling very full after he eats and like things were sitting in his chest.  He can eat a very small meal and feels like he ate a four course dinner.  He also reports that he has had episodes where he feels like his heart is fluttering or racing. FeNO elevated to 36. Poor control seemed to be related to inconsistent use of Trelegy; does much better when he is on it. Treated with depo, pred taper and empiric doxy course for AECOPD/asthma. Restart trelegy. Started him on Xyzal for allergies and protonix for GERD. Check CBC with diff - eos 0 and allergen panel - never drawn. Referred to cardiology d/t palpitations.   04/08/2022: OV with Edd Reppert NP for follow-up after being treated for AECOPD/asthma.  He reports feeling significantly better after prednisone and Doxy courses last week, and restarting his Trelegy inhaler.  His cough has mostly resolved and he has not noticed any more wheezing.  Activity tolerance has significantly improved.  He is able to climb stairs without difficulties.  He did see cardiology and is going to be wearing a cardiac monitor for 2 weeks and undergo echocardiogram for further evaluation of his palpitations.  He never started on Xyzal  and allergen panel was never drawn after last visit.  Feels like allergies are okay and have improved some since her last visit.  He did start on Protonix and feels like this is helped with his GERD symptoms.  No concerns or complaints today. Please notify patient that his allergen panel came back. He has multiple environmental allergies, mostly to trees/plants. He has a very low allergy response to dog dander as well.  07/11/2022: OV with Tuwanna Krausz NP for follow up. He was doing well since our  last visit up until the last few weeks to a month. He's noticed he becomes short of breath more easily and he's wheezing more. Acute exacerbation with elevated exhaled nitric oxide. Prednisone burst 40 mg for 5 days. Advised he use mucinex for chest congestion/mucolytic therapy. Continue prn albuterol; ok to use 15 min prior to activity to help with endurance. We will check cbc with diff today to assess eosinophils. May need to consider addition of singulair. We also discussed biologic therapy as a potential treatment option in the future if he continues to have poor control of his asthma/COPD  07/28/2022: OV with Alize Acy NP today for follow up. He reports feeling better since he was here last. He feels like the prednisone course helped. He still occasionally has some chest congestion and cough, but this is improved. His breathing is stable. He denies noticing much wheezing. He's using his Trelegy daily. Takes Xyzal daily for allergies.   10/28/2022: OV with Zarayah Lanting NP for follow-up.  His breathing has been stable since he was here last.  Able to do things around the house that any difficulty.  He does find that he gets short winded if he has to carry things up the stairs of their apartment.  Otherwise does not have to use his rescue inhaler much.  Cough waxes and wanes.  Occasionally productive and other days does not really have much of 1.  Denies any increased chest congestion or wheezing.  He is using his Trelegy daily.  Still takes either Xyzal or Claritin for allergies.  Using Singulair at night.  He did have low-dose lung cancer screening CT 12/15.  He had a small 3 millimeter nodule.  Lung RADS 2.  He did have atherosclerosis.  He does have a cardiologist.  He was told to start on a statin the last time he was seen there.  He never did this.  He plans to go back for follow-up appointment with them.  No chest pain.  Has not noticed as many palpitations recently.  02/27/2023: Ov with Danna Sewell NP for follow up with his  girlfriend. He had a flare in early April 2024 requiring steroids. He has since recovered. He saw his allergist, Dr. Maurine Minister, 4/11 for initial consult, and is preparing to start allergy shots. He feels like his breathing is doing okay. Hasn't noticed much wheezing since he completed the steroids. Cough still waxes and wanes, which is his baseline. Using his trelegy daily. Not using albuterol frequently. Taking singulair and claritin for allergies. Next LDCT chest is due December 2024. No concerns or complaints today.   06/29/2023: Today - follow up Patient presents today for follow up but is also having some acute symptoms. He started having more trouble with his breathing over the past few days. Feels like his chest has been tight. He's also been wheezing more. Has a little bit of a cough. Getting up a small amount of clear phlegm. He denies any known sick exposures. Sinus symptoms  are actually improved. He's been taking allergy shots which seem to be helping. He denies fevers, chills, hemoptysis, leg swelling, orthopnea. Using his Trelegy daily. Taking albuterol 1-2 times a day. Able to talk in complete sentences without difficulties.  No Known Allergies  Immunization History  Administered Date(s) Administered   Influenza,inj,Quad PF,6+ Mos 07/05/2017, 09/01/2018   PFIZER(Purple Top)SARS-COV-2 Vaccination 12/21/2019, 01/11/2020   Tdap 01/13/2021    Past Medical History:  Diagnosis Date   ADHD (attention deficit hyperactivity disorder)    Asthma    COPD (chronic obstructive pulmonary disease) (HCC)    GERD (gastroesophageal reflux disease)    Heart murmur    as a child     Tobacco History: Social History   Tobacco Use  Smoking Status Former   Current packs/day: 0.00   Average packs/day: 1 pack/day for 37.0 years (37.0 ttl pk-yrs)   Types: Cigarettes   Start date: 60   Quit date: 2017   Years since quitting: 7.6   Passive exposure: Past  Smokeless Tobacco Never   Counseling  given: Not Answered   Outpatient Medications Prior to Visit  Medication Sig Dispense Refill   albuterol (VENTOLIN HFA) 108 (90 Base) MCG/ACT inhaler INHALE 2 PUFFS BY MOUTH EVERY 4 HOURS AS NEEDED FOR WHEEZE OR FOR SHORTNESS OF BREATH 8.5 each 2   azelastine (ASTELIN) 0.1 % nasal spray Place 2 sprays into both nostrils 2 (two) times daily as needed for rhinitis. Use in each nostril as directed 30 mL 5   EPINEPHrine 0.3 mg/0.3 mL IJ SOAJ injection Inject 0.3 mg into the muscle as needed for anaphylaxis. 1 each 1   famotidine (PEPCID) 20 MG tablet Take 1 tab twice daily day before and day of RUSH appt 4 tablet 0   levocetirizine (XYZAL) 5 MG tablet Take 1 tablet (5 mg total) by mouth every evening. 30 tablet 5   montelukast (SINGULAIR) 10 MG tablet TAKE 1 TABLET BY MOUTH EVERYDAY AT BEDTIME 90 tablet 1   Olopatadine HCl (PATADAY) 0.2 % SOLN Place 1 drop into both eyes daily as needed. 2.5 mL 5   pantoprazole (PROTONIX) 40 MG tablet TAKE 1 TABLET BY MOUTH EVERY DAY 90 tablet 1   TRELEGY ELLIPTA 200-62.5-25 MCG/ACT AEPB INHALE 1 PUFF BY MOUTH EVERY DAY 180 each 1   fluticasone (FLONASE) 50 MCG/ACT nasal spray Place 2 sprays into both nostrils daily. 16 g 5   predniSONE (DELTASONE) 20 MG tablet Take 1 tabs morning day before & morning of RUSH appt. 2 tablet 0   Facility-Administered Medications Prior to Visit  Medication Dose Route Frequency Provider Last Rate Last Admin   EPINEPHrine (EPI-PEN) injection 0.3 mg  0.3 mg Intramuscular Once Marcelyn Bruins, MD         Review of Systems:   Constitutional: No weight loss or gain, night sweats, fevers, chills, fatigue, or lassitude. HEENT: No headaches, difficulty swallowing, tooth/dental problems, or sore throat. No sneezing, itching, ear ache +nasal congestion, frequent nasal clearing (improved) CV:    No chest pain, orthopnea, PND, palpitations, swelling in lower extremities, anasarca, dizziness, syncope Resp: +shortness of breath with  activity; minimal productive cough; chest tightness; wheezing. No hemoptysis.  No chest wall deformity GI:   No heartburn, indigestion, abdominal pain, nausea, vomiting, diarrhea, change in bowel habits, loss of appetite, bloody stools.  MSK:  No joint pain or swelling.  No decreased range of motion.  No back pain. Neuro: No dizziness or lightheadedness.  Psych: No depression or anxiety. Mood stable.  Physical Exam:  BP 130/80 (BP Location: Left Arm, Cuff Size: Normal)   Pulse 91   Ht 5\' 6"  (1.676 m)   Wt 183 lb 6.4 oz (83.2 kg)   SpO2 94%   BMI 29.60 kg/m   GEN: Pleasant, interactive, well-appearing; in no acute distress. HEENT:  Normocephalic and atraumatic. PERRLA. Sclera white. Nasal turbinates pink, moist.  Perforated septum.  No rhinorrhea present. Oropharynx pink and moist, without exudate or edema. No lesions, ulcerations, or postnasal drip.  NECK:  Supple w/ fair ROM. No JVD present. Normal carotid impulses w/o bruits. Thyroid symmetrical with no goiter or nodules palpated. No lymphadenopathy.   CV: RRR, no m/r/g, no peripheral edema. Pulses intact, +2 bilaterally. No cyanosis, pallor or clubbing. PULMONARY:  Unlabored, regular breathing.  Scattered expiratory wheezes bilaterally A&P. No accessory muscle use. No dullness to percussion. GI: BS present and normoactive. Soft, non-tender to palpation. No organomegaly or masses detected.  MSK: No erythema, warmth or tenderness. No deformities or joint swelling noted.  Neuro: A/Ox3. No focal deficits noted.   Skin: Warm, no lesions or rashe Psych: Normal affect and behavior. Judgement and thought content appropriate.     Lab Results:  CBC    Component Value Date/Time   WBC 8.9 07/11/2022 1636   RBC 4.29 07/11/2022 1636   HGB 13.5 07/11/2022 1636   HGB 13.9 04/07/2022 0924   HCT 39.8 07/11/2022 1636   HCT 40.2 04/07/2022 0924   PLT 292.0 07/11/2022 1636   PLT 268 04/07/2022 0924   MCV 92.7 07/11/2022 1636   MCV 89  04/07/2022 0924   MCH 30.6 04/07/2022 0924   MCH 31.3 02/13/2020 0813   MCHC 33.9 07/11/2022 1636   RDW 14.6 07/11/2022 1636   RDW 13.2 04/07/2022 0924   LYMPHSABS 2.7 07/11/2022 1636   MONOABS 0.9 07/11/2022 1636   EOSABS 0.1 07/11/2022 1636   BASOSABS 0.0 07/11/2022 1636    BMET    Component Value Date/Time   NA 140 04/07/2022 0924   K 4.6 04/07/2022 0924   CL 103 04/07/2022 0924   CO2 23 04/07/2022 0924   GLUCOSE 98 04/07/2022 0924   GLUCOSE 98 02/13/2020 0813   BUN 22 04/07/2022 0924   CREATININE 0.93 04/07/2022 0924   CALCIUM 9.2 04/07/2022 0924   GFRNONAA >60 02/13/2020 0813   GFRAA >60 02/13/2020 0813    BNP No results found for: "BNP"   Imaging:  No results found.  Administration History     None          Latest Ref Rng & Units 12/05/2018   11:42 AM  PFT Results  FVC-Pre L 3.10   FVC-Predicted Pre % 71   FVC-Post L 3.43   FVC-Predicted Post % 78   Pre FEV1/FVC % % 51   Post FEV1/FCV % % 52   FEV1-Pre L 1.57   FEV1-Predicted Pre % 46   FEV1-Post L 1.77   DLCO uncorrected ml/min/mmHg 21.01   DLCO UNC% % 82   DLCO corrected ml/min/mmHg 21.25   DLCO COR %Predicted % 82   DLVA Predicted % 83   TLC L 7.04   TLC % Predicted % 116   RV % Predicted % 206     Lab Results  Component Value Date   NITRICOXIDE 20 11/19/2018        Assessment & Plan:   Asthma with COPD with exacerbation (HCC) Asthma/COPD exacerbation. Last flare was April 2024 requiring steroids. We will treat him with prednisone taper and  maximize bronchodilator therapy. Albuterol neb in office. Depo 80 mg inj x 1. Check eosinophil count today as we may consider starting him on Dupixent given his recurrent exacerbations. Action plan in place. Close follow up.  Patient Instructions  Continue Trelegy 1 puff daily. Brush tongue and rinse mouth afterwards Continue Albuterol inhaler 2 puffs or 3 mL neb every 6 hours as needed for shortness of breath or wheezing. Notify if symptoms  persist despite rescue inhaler/neb use. Use neb at least twice a day until symptoms improve  Continue Xyzal 5 mg or claritin 10 mg daily for allergies Continue Singulair 10 mg At bedtime for allergy/asthma  Continue nasal sprays   Prednisone taper. 4 tabs for 2 days, then 3 tabs for 2 days, 2 tabs for 2 days, then 1 tab for 2 days, then stop. Take in AM with food.  Follow up with allergist as scheduled  We may consider starting you on asthma shot to better manage you. We will need to get blood work first.    Follow up in 3 weeks with Dr. Isaiah Serge or Florentina Addison Yasiel Goyne,NP. Ok to double book. If symptoms worsen, please contact office for sooner follow up or seek emergency care.    Allergic rhinitis Improved symptoms with immunotherapy. Follow by Dr. Maurine Minister with allergy.     I spent 35 minutes of dedicated to the care of this patient on the date of this encounter to include pre-visit review of records, face-to-face time with the patient discussing conditions above, post visit ordering of testing, clinical documentation with the electronic health record, making appropriate referrals as documented, and communicating necessary findings to members of the patients care team.  Noemi Chapel, NP 06/29/2023  Pt aware and understands NP's role.

## 2023-06-29 NOTE — Assessment & Plan Note (Signed)
Improved symptoms with immunotherapy. Follow by Dr. Maurine Minister with allergy.

## 2023-06-29 NOTE — Patient Instructions (Addendum)
Continue Trelegy 1 puff daily. Brush tongue and rinse mouth afterwards Continue Albuterol inhaler 2 puffs or 3 mL neb every 6 hours as needed for shortness of breath or wheezing. Notify if symptoms persist despite rescue inhaler/neb use. Use neb at least twice a day until symptoms improve  Continue Xyzal 5 mg or claritin 10 mg daily for allergies Continue Singulair 10 mg At bedtime for allergy/asthma  Continue nasal sprays   Prednisone taper. 4 tabs for 2 days, then 3 tabs for 2 days, 2 tabs for 2 days, then 1 tab for 2 days, then stop. Take in AM with food.  Follow up with allergist as scheduled  We may consider starting you on asthma shot to better manage you. We will need to get blood work first.    Follow up in 3 weeks with Dr. Isaiah Serge or Florentina Addison Monna Crean,NP. Ok to double book. If symptoms worsen, please contact office for sooner follow up or seek emergency care.

## 2023-06-29 NOTE — Assessment & Plan Note (Addendum)
Asthma/COPD exacerbation. Last flare was April 2024 requiring steroids. We will treat him with prednisone taper and maximize bronchodilator therapy. Albuterol neb in office. Depo 80 mg inj x 1. Check eosinophil count today as we may consider starting him on Dupixent given his recurrent exacerbations. Action plan in place. Close follow up.  Patient Instructions  Continue Trelegy 1 puff daily. Brush tongue and rinse mouth afterwards Continue Albuterol inhaler 2 puffs or 3 mL neb every 6 hours as needed for shortness of breath or wheezing. Notify if symptoms persist despite rescue inhaler/neb use. Use neb at least twice a day until symptoms improve  Continue Xyzal 5 mg or claritin 10 mg daily for allergies Continue Singulair 10 mg At bedtime for allergy/asthma  Continue nasal sprays   Prednisone taper. 4 tabs for 2 days, then 3 tabs for 2 days, 2 tabs for 2 days, then 1 tab for 2 days, then stop. Take in AM with food.  Follow up with allergist as scheduled  We may consider starting you on asthma shot to better manage you. We will need to get blood work first.    Follow up in 3 weeks with Dr. Isaiah Serge or Florentina Addison Pradeep Beaubrun,NP. Ok to double book. If symptoms worsen, please contact office for sooner follow up or seek emergency care.

## 2023-06-30 ENCOUNTER — Ambulatory Visit: Payer: BC Managed Care – PPO | Admitting: Nurse Practitioner

## 2023-06-30 LAB — CBC WITH DIFFERENTIAL/PLATELET
Basophils Absolute: 0.1 10*3/uL (ref 0.0–0.1)
Basophils Relative: 0.9 % (ref 0.0–3.0)
Eosinophils Absolute: 0.2 10*3/uL (ref 0.0–0.7)
Eosinophils Relative: 1.9 % (ref 0.0–5.0)
HCT: 43.4 % (ref 39.0–52.0)
Hemoglobin: 14 g/dL (ref 13.0–17.0)
Lymphocytes Relative: 33.7 % (ref 12.0–46.0)
Lymphs Abs: 3 10*3/uL (ref 0.7–4.0)
MCHC: 32.2 g/dL (ref 30.0–36.0)
MCV: 92.6 fl (ref 78.0–100.0)
Monocytes Absolute: 0.8 10*3/uL (ref 0.1–1.0)
Monocytes Relative: 8.9 % (ref 3.0–12.0)
Neutro Abs: 4.8 10*3/uL (ref 1.4–7.7)
Neutrophils Relative %: 54.6 % (ref 43.0–77.0)
Platelets: 338 10*3/uL (ref 150.0–400.0)
RBC: 4.69 Mil/uL (ref 4.22–5.81)
RDW: 14.9 % (ref 11.5–15.5)
WBC: 8.9 10*3/uL (ref 4.0–10.5)

## 2023-07-06 ENCOUNTER — Ambulatory Visit (INDEPENDENT_AMBULATORY_CARE_PROVIDER_SITE_OTHER): Payer: BC Managed Care – PPO | Admitting: *Deleted

## 2023-07-06 DIAGNOSIS — J309 Allergic rhinitis, unspecified: Secondary | ICD-10-CM

## 2023-07-09 ENCOUNTER — Other Ambulatory Visit: Payer: Self-pay | Admitting: Nurse Practitioner

## 2023-07-09 DIAGNOSIS — J441 Chronic obstructive pulmonary disease with (acute) exacerbation: Secondary | ICD-10-CM

## 2023-07-09 DIAGNOSIS — J4540 Moderate persistent asthma, uncomplicated: Secondary | ICD-10-CM

## 2023-07-12 ENCOUNTER — Ambulatory Visit (INDEPENDENT_AMBULATORY_CARE_PROVIDER_SITE_OTHER): Payer: BC Managed Care – PPO | Admitting: *Deleted

## 2023-07-12 DIAGNOSIS — J309 Allergic rhinitis, unspecified: Secondary | ICD-10-CM

## 2023-07-20 ENCOUNTER — Ambulatory Visit: Payer: BC Managed Care – PPO | Admitting: Nurse Practitioner

## 2023-07-20 ENCOUNTER — Encounter: Payer: Self-pay | Admitting: Nurse Practitioner

## 2023-07-20 VITALS — BP 136/72 | HR 92 | Ht 66.0 in | Wt 180.8 lb

## 2023-07-20 DIAGNOSIS — J3089 Other allergic rhinitis: Secondary | ICD-10-CM

## 2023-07-20 DIAGNOSIS — J4489 Other specified chronic obstructive pulmonary disease: Secondary | ICD-10-CM | POA: Diagnosis not present

## 2023-07-20 DIAGNOSIS — Z87891 Personal history of nicotine dependence: Secondary | ICD-10-CM | POA: Diagnosis not present

## 2023-07-20 NOTE — Assessment & Plan Note (Signed)
Moderately severe obstructive airway disease.  Allergic phenotype.  Prone to exacerbations.  Resolved exacerbation with clinical improvement following steroid taper.  Peripheral eosinophils are elevated between 200-300 on previous lab testing.  May consider initiating Dupixent therapy if he has any further exacerbations moving forward.  He would be agreeable to this.  Will continue to monitor him in the meantime on triple therapy and continue trigger prevention measures.  Action plan in place.  Encouraged to remain active.  Patient Instructions  Continue Trelegy 1 puff daily. Brush tongue and rinse mouth afterwards Continue Albuterol inhaler 2 puffs or 3 mL neb every 6 hours as needed for shortness of breath or wheezing. Notify if symptoms persist despite rescue inhaler/neb use. Continue Xyzal 5 mg or claritin 10 mg daily for allergies Continue Singulair 10 mg At bedtime for allergy/asthma  Continue nasal sprays    Follow up with allergist as scheduled   We may consider starting you on asthma shot with Dupixent to better manage you if you have any more flare ups moving forward.   Follow up in 3 months with Dr. Isaiah Serge. If symptoms worsen, please contact office for sooner follow up or seek emergency care.

## 2023-07-20 NOTE — Assessment & Plan Note (Signed)
Followed by lung cancer screening program. Next CT due 09/2023

## 2023-07-20 NOTE — Patient Instructions (Addendum)
Continue Trelegy 1 puff daily. Brush tongue and rinse mouth afterwards Continue Albuterol inhaler 2 puffs or 3 mL neb every 6 hours as needed for shortness of breath or wheezing. Notify if symptoms persist despite rescue inhaler/neb use. Continue Xyzal 5 mg or claritin 10 mg daily for allergies Continue Singulair 10 mg At bedtime for allergy/asthma  Continue nasal sprays    Follow up with allergist as scheduled   We may consider starting you on asthma shot with Dupixent to better manage you if you have any more flare ups moving forward.   Follow up in 3 months with Dr. Isaiah Serge. If symptoms worsen, please contact office for sooner follow up or seek emergency care.

## 2023-07-20 NOTE — Assessment & Plan Note (Signed)
Followed by allergy. Improved with immune therapy. No changes to current regimen.

## 2023-07-20 NOTE — Progress Notes (Signed)
@Patient  ID: [name], male    DOB: Sep 02, 1971, 52 y.o.   MRN: [id number]  Chief Complaint  Patient presents with   Follow-up    Pt is here for SOB/Chest Tightness F/U visit. Pt states he feels better and he has not used his nebulizer yet.     Referring provider: No ref. provider found  HPI: 52 year old male, former smoker followed for poorly controlled COPD/asthma.  He is a patient of Dr. Shirlee More and last seen in office 06/29/2023 by [facility] NP.  Past medical history for perforated nasal septum, GERD, arthritis, seasonal allergies, former substance abuse.  TEST/EVENTS:  01/21/2020 CXR 2 view: Mild background emphysema without airspace disease, effusion or pneumothorax. 12/05/2020 PFTs: FVC 78, FEV1 52, ratio 52, TLC 116, DLCOcor 82.  Moderately severe obstructive airway disease with reversibility 04/08/2022 positive RAST, IgE 104 04/27/2022 echocardiogram: EF 60-65, GIDD,  10/07/2022 LDCT chest: atherosclerosis. No LAD. Small nodule in LUL, 3 mm. Mild scarring at lung bases. Mild diffuse bronchial wall thickening with mild emphysema. Lung RADS 2S. Hepatic steatosis.   08/03/2020: Sudie Grumbling with Clent Ridges NP.  Compliant with Trelegy but will occasionally be without prescription for 2 to 3 days before filling and notice a difference in his asthma symptoms.  Reports that his chest tightness and cough are typically worse in the morning.  Did have some wheezing at OV.  Cough is productive with clear mucus.  Experiencing 2-3 exacerbations on average in 6 months.  Stepped up to Trelegy 200 and added Singulair at bedtime.  Treated with prednisone taper.  Recommended that he follow-up in 6 months or sooner if no improvement.  03/25/2022: OV with Latise Dilley NP for overdue follow-up and is actually having acute symptoms as well.  He has had issues with his breathing over the last few months.  He has been trying to space out his Trelegy so he did not run out of it.  He has now been off of it for the past 3 to 4 weeks.   Has noticed a increase in shortness of breath, increase in productive cough and wheezing since.  He also has significant allergy type symptoms and chronic nasal congestion.  He does have a known septal perforation and he is anticipated to see ENT later this month.  He has also been having issues with feeling very full after he eats and like things were sitting in his chest.  He can eat a very small meal and feels like he ate a four course dinner.  He also reports that he has had episodes where he feels like his heart is fluttering or racing. FeNO elevated to 36. Poor control seemed to be related to inconsistent use of Trelegy; does much better when he is on it. Treated with depo, pred taper and empiric doxy course for AECOPD/asthma. Restart trelegy. Started him on Xyzal for allergies and protonix for GERD. Check CBC with diff - eos 0 and allergen panel - never drawn. Referred to cardiology d/t palpitations.   04/08/2022: OV with Reyhan Moronta NP for follow-up after being treated for AECOPD/asthma.  He reports feeling significantly better after prednisone and Doxy courses last week, and restarting his Trelegy inhaler.  His cough has mostly resolved and he has not noticed any more wheezing.  Activity tolerance has significantly improved.  He is able to climb stairs without difficulties.  He did see cardiology and is going to be wearing a cardiac monitor for 2 weeks and undergo echocardiogram for further evaluation of his palpitations.  He never started on Xyzal and allergen panel was never drawn after last visit.  Feels like allergies are okay and have improved some since her last visit.  He did start on Protonix and feels like this is helped with his GERD symptoms.  No concerns or complaints today. Please notify patient that his allergen panel came back. He has multiple environmental allergies, mostly to trees/plants. He has a very low allergy response to dog dander as well.  07/11/2022: OV with Taci Sterling NP for follow up. He  was doing well since our last visit up until the last few weeks to a month. He's noticed he becomes short of breath more easily and he's wheezing more. Acute exacerbation with elevated exhaled nitric oxide. Prednisone burst 40 mg for 5 days. Advised he use mucinex for chest congestion/mucolytic therapy. Continue prn albuterol; ok to use 15 min prior to activity to help with endurance. We will check cbc with diff today to assess eosinophils. May need to consider addition of singulair. We also discussed biologic therapy as a potential treatment option in the future if he continues to have poor control of his asthma/COPD  07/28/2022: OV with Jaston Havens NP today for follow up. He reports feeling better since he was here last. He feels like the prednisone course helped. He still occasionally has some chest congestion and cough, but this is improved. His breathing is stable. He denies noticing much wheezing. He's using his Trelegy daily. Takes Xyzal daily for allergies.   10/28/2022: OV with Traver Meckes NP for follow-up.  His breathing has been stable since he was here last.  Able to do things around the house that any difficulty.  He does find that he gets short winded if he has to carry things up the stairs of their apartment.  Otherwise does not have to use his rescue inhaler much.  Cough waxes and wanes.  Occasionally productive and other days does not really have much of 1.  Denies any increased chest congestion or wheezing.  He is using his Trelegy daily.  Still takes either Xyzal or Claritin for allergies.  Using Singulair at night.  He did have low-dose lung cancer screening CT 12/15.  He had a small 3 millimeter nodule.  Lung RADS 2.  He did have atherosclerosis.  He does have a cardiologist.  He was told to start on a statin the last time he was seen there.  He never did this.  He plans to go back for follow-up appointment with them.  No chest pain.  Has not noticed as many palpitations recently.  02/27/2023: Ov with Toluwanimi Radebaugh  NP for follow up with his girlfriend. He had a flare in early April 2024 requiring steroids. He has since recovered. He saw his allergist, Dr. Maurine Minister, 4/11 for initial consult, and is preparing to start allergy shots. He feels like his breathing is doing okay. Hasn't noticed much wheezing since he completed the steroids. Cough still waxes and wanes, which is his baseline. Using his trelegy daily. Not using albuterol frequently. Taking singulair and claritin for allergies. Next LDCT chest is due December 2024. No concerns or complaints today.   06/29/2023: OV with Iman Orourke NP for follow up but is also having some acute symptoms. He started having more trouble with his breathing over the past few days. Feels like his chest has been tight. He's also been wheezing more. Has a little bit of a cough. Getting up a small amount of clear phlegm. He denies any known sick exposures.  Sinus symptoms are actually improved. He's been taking allergy shots which seem to be helping. He denies fevers, chills, hemoptysis, leg swelling, orthopnea. Using his Trelegy daily. Taking albuterol 1-2 times a day. Able to talk in complete sentences without difficulties.  07/20/2023: Today-follow up Patient resents today for follow-up after being treated for acute asthma/COPD exacerbation with steroid taper.  He is feeling much better today.  Feels like his breathing is back to his baseline.  Not having more issues with chest tightness.  Not noticing much wheezing.  Cough is resolved.  He did receive a nebulizer machine but he has not had to use this.  Still using his Trelegy once a day.  He is on Xyzal, Singulair and nasal sprays for his allergies.  He also receives immunotherapy injections with asthma/allergy.  No Known Allergies  Immunization History  Administered Date(s) Administered   Influenza,inj,Quad PF,6+ Mos 07/05/2017, 09/01/2018   PFIZER(Purple Top)SARS-COV-2 Vaccination 12/21/2019, 01/11/2020   Tdap 01/13/2021    Past  Medical History:  Diagnosis Date   ADHD (attention deficit hyperactivity disorder)    Asthma    COPD (chronic obstructive pulmonary disease) (HCC)    GERD (gastroesophageal reflux disease)    Heart murmur    as a child     Tobacco History: Social History   Tobacco Use  Smoking Status Former   Current packs/day: 0.00   Average packs/day: 1 pack/day for 37.0 years (37.0 ttl pk-yrs)   Types: Cigarettes   Start date: 76   Quit date: 2017   Years since quitting: 7.7   Passive exposure: Past  Smokeless Tobacco Never   Counseling given: Not Answered   Outpatient Medications Prior to Visit  Medication Sig Dispense Refill   albuterol (PROVENTIL) (2.5 MG/3ML) 0.083% nebulizer solution Take 3 mLs (2.5 mg total) by nebulization every 6 (six) hours as needed for wheezing or shortness of breath. 75 mL 5   albuterol (VENTOLIN HFA) 108 (90 Base) MCG/ACT inhaler INHALE 2 PUFFS BY MOUTH EVERY 4 HOURS AS NEEDED FOR WHEEZE OR FOR SHORTNESS OF BREATH 8.5 each 2   EPINEPHrine 0.3 mg/0.3 mL IJ SOAJ injection Inject 0.3 mg into the muscle as needed for anaphylaxis. 1 each 1   famotidine (PEPCID) 20 MG tablet Take 1 tab twice daily day before and day of [facility] appt 4 tablet 0   levocetirizine (XYZAL) 5 MG tablet Take 1 tablet (5 mg total) by mouth every evening. 30 tablet 5   montelukast (SINGULAIR) 10 MG tablet TAKE 1 TABLET BY MOUTH EVERYDAY AT BEDTIME 90 tablet 1   Olopatadine HCl (PATADAY) 0.2 % SOLN Place 1 drop into both eyes daily as needed. 2.5 mL 5   pantoprazole (PROTONIX) 40 MG tablet TAKE 1 TABLET BY MOUTH EVERY DAY 90 tablet 1   TRELEGY ELLIPTA 200-62.5-25 MCG/ACT AEPB INHALE 1 PUFF BY MOUTH EVERY DAY 180 each 1   azelastine (ASTELIN) 0.1 % nasal spray Place 2 sprays into both nostrils 2 (two) times daily as needed for rhinitis. Use in each nostril as directed 30 mL 5   predniSONE (DELTASONE) 10 MG tablet 4 tabs for 2 days, then 3 tabs for 2 days, 2 tabs for 2 days, then 1 tab for 2 days,  then stop 20 tablet 0   Facility-Administered Medications Prior to Visit  Medication Dose Route Frequency Provider Last Rate Last Admin   EPINEPHrine (EPI-PEN) injection 0.3 mg  0.3 mg Intramuscular Once Marcelyn Bruins, MD       methylPREDNISolone acetate (DEPO-MEDROL)  injection 80 mg  80 mg Intramuscular Once Danaja Lasota, Ruby Cola, NP         Review of Systems:   Constitutional: No weight loss or gain, night sweats, fevers, chills, fatigue, or lassitude. HEENT: No headaches, difficulty swallowing, tooth/dental problems, or sore throat. No sneezing, itching, ear ache +nasal congestion, frequent nasal clearing (improved) CV:    No chest pain, orthopnea, PND, palpitations, swelling in lower extremities, anasarca, dizziness, syncope Resp: +shortness of breath with activity (baseline). No wheezing. No cough. No hemoptysis.  No chest wall deformity GI:   No heartburn, indigestion, abdominal pain, nausea, vomiting, diarrhea, change in bowel habits, loss of appetite, bloody stools.  MSK:  No joint pain or swelling.  No decreased range of motion.  No back pain. Neuro: No dizziness or lightheadedness.  Psych: No depression or anxiety. Mood stable.     Physical Exam:  BP 136/72 (BP Location: Right Arm, Cuff Size: Normal)   Pulse 92   Ht 5\' 6"  (1.676 m)   Wt 180 lb 12.8 oz (82 kg)   SpO2 94%   BMI 29.18 kg/m   GEN: Pleasant, interactive, well-appearing; in no acute distress. HEENT:  Normocephalic and atraumatic. PERRLA. Sclera white. Nasal turbinates pink, moist.  Perforated septum.  No rhinorrhea present. Oropharynx pink and moist, without exudate or edema. No lesions, ulcerations, or postnasal drip.  NECK:  Supple w/ fair ROM. No JVD present. Normal carotid impulses w/o bruits. Thyroid symmetrical with no goiter or nodules palpated. No lymphadenopathy.   CV: RRR, no m/r/g, no peripheral edema. Pulses intact, +2 bilaterally. No cyanosis, pallor or clubbing. PULMONARY:  Unlabored,  regular breathing. Clear bilaterally A&P w/o wheezes/rales/rhonchi. No accessory muscle use. No dullness to percussion. GI: BS present and normoactive. Soft, non-tender to palpation. No organomegaly or masses detected.  MSK: No erythema, warmth or tenderness. No deformities or joint swelling noted.  Neuro: A/Ox3. No focal deficits noted.   Skin: Warm, no lesions or rashe Psych: Normal affect and behavior. Judgement and thought content appropriate.     Lab Results:  CBC    Component Value Date/Time   WBC 8.9 06/29/2023 1544   RBC 4.69 06/29/2023 1544   HGB 14.0 06/29/2023 1544   HGB 13.9 04/07/2022 0924   HCT 43.4 06/29/2023 1544   HCT 40.2 04/07/2022 0924   PLT 338.0 06/29/2023 1544   PLT 268 04/07/2022 0924   MCV 92.6 06/29/2023 1544   MCV 89 04/07/2022 0924   MCH 30.6 04/07/2022 0924   MCH 31.3 02/13/2020 0813   MCHC 32.2 06/29/2023 1544   RDW 14.9 06/29/2023 1544   RDW 13.2 04/07/2022 0924   LYMPHSABS 3.0 06/29/2023 1544   MONOABS 0.8 06/29/2023 1544   EOSABS 0.2 06/29/2023 1544   BASOSABS 0.1 06/29/2023 1544    BMET    Component Value Date/Time   NA 140 04/07/2022 0924   K 4.6 04/07/2022 0924   CL 103 04/07/2022 0924   CO2 23 04/07/2022 0924   GLUCOSE 98 04/07/2022 0924   GLUCOSE 98 02/13/2020 0813   BUN 22 04/07/2022 0924   CREATININE 0.93 04/07/2022 0924   CALCIUM 9.2 04/07/2022 0924   GFRNONAA >60 02/13/2020 0813   GFRAA >60 02/13/2020 0813    BNP No results found for: "BNP"   Imaging:  No results found.  Administration History     None          Latest Ref Rng & Units 12/05/2018   11:42 AM  PFT Results  FVC-Pre L  3.10   FVC-Predicted Pre % 71   FVC-Post L 3.43   FVC-Predicted Post % 78   Pre FEV1/FVC % % 51   Post FEV1/FCV % % 52   FEV1-Pre L 1.57   FEV1-Predicted Pre % 46   FEV1-Post L 1.77   DLCO uncorrected ml/min/mmHg 21.01   DLCO UNC% % 82   DLCO corrected ml/min/mmHg 21.25   DLCO COR %Predicted % 82   DLVA Predicted % 83    TLC L 7.04   TLC % Predicted % 116   RV % Predicted % 206     Lab Results  Component Value Date   NITRICOXIDE 20 11/19/2018        Assessment & Plan:   Asthma with COPD Moderately severe obstructive airway disease.  Allergic phenotype.  Prone to exacerbations.  Resolved exacerbation with clinical improvement following steroid taper.  Peripheral eosinophils are elevated between 200-300 on previous lab testing.  May consider initiating Dupixent therapy if he has any further exacerbations moving forward.  He would be agreeable to this.  Will continue to monitor him in the meantime on triple therapy and continue trigger prevention measures.  Action plan in place.  Encouraged to remain active.  Patient Instructions  Continue Trelegy 1 puff daily. Brush tongue and rinse mouth afterwards Continue Albuterol inhaler 2 puffs or 3 mL neb every 6 hours as needed for shortness of breath or wheezing. Notify if symptoms persist despite rescue inhaler/neb use. Continue Xyzal 5 mg or claritin 10 mg daily for allergies Continue Singulair 10 mg At bedtime for allergy/asthma  Continue nasal sprays    Follow up with allergist as scheduled   We may consider starting you on asthma shot with Dupixent to better manage you if you have any more flare ups moving forward.   Follow up in 3 months with Dr. Isaiah Serge. If symptoms worsen, please contact office for sooner follow up or seek emergency care.    Allergic rhinitis Followed by allergy. Improved with immune therapy. No changes to current regimen.   Former smoker Followed by lung cancer screening program. Next CT due 09/2023   I spent 28 minutes of dedicated to the care of this patient on the date of this encounter to include pre-visit review of records, face-to-face time with the patient discussing conditions above, post visit ordering of testing, clinical documentation with the electronic health record, making appropriate referrals as documented, and  communicating necessary findings to members of the patients care team.  Noemi Chapel, NP 07/20/2023  Pt aware and understands NP's role.

## 2023-07-21 ENCOUNTER — Ambulatory Visit (INDEPENDENT_AMBULATORY_CARE_PROVIDER_SITE_OTHER): Payer: BC Managed Care – PPO

## 2023-07-21 DIAGNOSIS — J309 Allergic rhinitis, unspecified: Secondary | ICD-10-CM

## 2023-07-26 ENCOUNTER — Ambulatory Visit (INDEPENDENT_AMBULATORY_CARE_PROVIDER_SITE_OTHER): Payer: BC Managed Care – PPO | Admitting: *Deleted

## 2023-07-26 DIAGNOSIS — J309 Allergic rhinitis, unspecified: Secondary | ICD-10-CM | POA: Diagnosis not present

## 2023-08-01 DIAGNOSIS — J301 Allergic rhinitis due to pollen: Secondary | ICD-10-CM | POA: Diagnosis not present

## 2023-08-01 NOTE — Progress Notes (Signed)
VIALS EXP 07-31-24

## 2023-08-02 ENCOUNTER — Ambulatory Visit (INDEPENDENT_AMBULATORY_CARE_PROVIDER_SITE_OTHER): Payer: BC Managed Care – PPO | Admitting: *Deleted

## 2023-08-02 DIAGNOSIS — J309 Allergic rhinitis, unspecified: Secondary | ICD-10-CM

## 2023-08-10 ENCOUNTER — Ambulatory Visit (INDEPENDENT_AMBULATORY_CARE_PROVIDER_SITE_OTHER): Payer: BC Managed Care – PPO | Admitting: *Deleted

## 2023-08-10 DIAGNOSIS — J309 Allergic rhinitis, unspecified: Secondary | ICD-10-CM | POA: Diagnosis not present

## 2023-08-16 ENCOUNTER — Ambulatory Visit (INDEPENDENT_AMBULATORY_CARE_PROVIDER_SITE_OTHER): Payer: BC Managed Care – PPO | Admitting: *Deleted

## 2023-08-16 DIAGNOSIS — J309 Allergic rhinitis, unspecified: Secondary | ICD-10-CM | POA: Diagnosis not present

## 2023-08-23 ENCOUNTER — Ambulatory Visit (INDEPENDENT_AMBULATORY_CARE_PROVIDER_SITE_OTHER): Payer: BC Managed Care – PPO | Admitting: *Deleted

## 2023-08-23 DIAGNOSIS — J309 Allergic rhinitis, unspecified: Secondary | ICD-10-CM

## 2023-09-01 ENCOUNTER — Ambulatory Visit (INDEPENDENT_AMBULATORY_CARE_PROVIDER_SITE_OTHER): Payer: BC Managed Care – PPO | Admitting: *Deleted

## 2023-09-01 DIAGNOSIS — J309 Allergic rhinitis, unspecified: Secondary | ICD-10-CM | POA: Diagnosis not present

## 2023-09-08 ENCOUNTER — Ambulatory Visit (INDEPENDENT_AMBULATORY_CARE_PROVIDER_SITE_OTHER): Payer: BC Managed Care – PPO | Admitting: *Deleted

## 2023-09-08 DIAGNOSIS — J309 Allergic rhinitis, unspecified: Secondary | ICD-10-CM

## 2023-09-14 ENCOUNTER — Ambulatory Visit (INDEPENDENT_AMBULATORY_CARE_PROVIDER_SITE_OTHER): Payer: BC Managed Care – PPO

## 2023-09-14 DIAGNOSIS — J309 Allergic rhinitis, unspecified: Secondary | ICD-10-CM

## 2023-09-18 ENCOUNTER — Other Ambulatory Visit: Payer: Self-pay | Admitting: Acute Care

## 2023-09-18 DIAGNOSIS — Z87891 Personal history of nicotine dependence: Secondary | ICD-10-CM

## 2023-09-18 DIAGNOSIS — Z122 Encounter for screening for malignant neoplasm of respiratory organs: Secondary | ICD-10-CM

## 2023-09-25 ENCOUNTER — Ambulatory Visit (INDEPENDENT_AMBULATORY_CARE_PROVIDER_SITE_OTHER): Payer: BC Managed Care – PPO | Admitting: *Deleted

## 2023-09-25 DIAGNOSIS — J309 Allergic rhinitis, unspecified: Secondary | ICD-10-CM | POA: Diagnosis not present

## 2023-10-01 ENCOUNTER — Other Ambulatory Visit: Payer: Self-pay | Admitting: Nurse Practitioner

## 2023-10-01 DIAGNOSIS — J441 Chronic obstructive pulmonary disease with (acute) exacerbation: Secondary | ICD-10-CM

## 2023-10-01 DIAGNOSIS — J4489 Other specified chronic obstructive pulmonary disease: Secondary | ICD-10-CM

## 2023-10-09 ENCOUNTER — Ambulatory Visit (INDEPENDENT_AMBULATORY_CARE_PROVIDER_SITE_OTHER): Payer: BC Managed Care – PPO

## 2023-10-09 DIAGNOSIS — J309 Allergic rhinitis, unspecified: Secondary | ICD-10-CM

## 2023-10-12 ENCOUNTER — Other Ambulatory Visit: Payer: BC Managed Care – PPO

## 2023-10-12 ENCOUNTER — Encounter: Payer: Self-pay | Admitting: Pulmonary Disease

## 2023-10-12 ENCOUNTER — Ambulatory Visit: Payer: BC Managed Care – PPO | Admitting: Pulmonary Disease

## 2023-10-12 VITALS — BP 132/81 | HR 91 | Temp 97.9°F | Ht 66.0 in | Wt 178.6 lb

## 2023-10-12 DIAGNOSIS — R918 Other nonspecific abnormal finding of lung field: Secondary | ICD-10-CM

## 2023-10-12 DIAGNOSIS — J4489 Other specified chronic obstructive pulmonary disease: Secondary | ICD-10-CM | POA: Diagnosis not present

## 2023-10-12 DIAGNOSIS — J45909 Unspecified asthma, uncomplicated: Secondary | ICD-10-CM

## 2023-10-12 NOTE — Patient Instructions (Signed)
VISIT SUMMARY:  During today's visit, we discussed your recent asthma and COPD exacerbations, likely due to environmental triggers such as construction dust and mildew. We also reviewed your current treatment plan, including your use of Trelegy and albuterol, and your recent start with allergy shots. Additionally, we talked about your stable lung nodules and the upcoming CT scan. General health maintenance and lifestyle recommendations were also covered.  YOUR PLAN:  -ASTHMA/COPD: Asthma and COPD are chronic respiratory conditions that cause breathing difficulties. Your recent exacerbations are likely due to environmental triggers. Continue using Trelegy and albuterol as prescribed and keep up with your weekly allergy shots. We will reevaluate your condition in 6 months or sooner if your symptoms worsen.  -LUNG NODULES: Lung nodules are small growths in the lungs that are currently stable. Your next screening CT scan is scheduled for October 23, 2023. Continue with your current plan and attend the scheduled scan.  -GENERAL HEALTH MAINTENANCE: Maintain your smoke-free lifestyle and continue working. If your allergy shots do not adequately control your symptoms, we may consider adding Dupixent to your treatment plan.  INSTRUCTIONS:  Please schedule a follow-up appointment in 6 months or sooner if your asthma or COPD symptoms worsen. Also, ensure you attend your next CT scan on October 23, 2023.

## 2023-10-12 NOTE — Progress Notes (Signed)
[name]    [id number]    Dec 24, 1970  Primary Care Physician:Pcp, No  Referring Physician: No referring provider defined for this encounter.  Chief complaint: Follow-up for COPD, asthma  HPI: 52 y.o.  with past medical history of asthma, ADHD. He has a history of childhood asthma.  Not on any regular inhalers.  Complains of bronchitis with increasing cough, congestion, sputum for the past 2 months.  He had been given several rounds of prednisone.  Started on [employer] generic inhaler 2 months ago. States that he is somewhat better but still continues to have symptoms of congestion, dyspnea on exertion, cough with white mucus. 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  No mold, hot tub, Jacuzzi, humidifier Smoking history: 30-pack-year smoker.  Quit in 2017 Travel history: No significant travel Relevant family history: Family history of emphysema. ACQ score 12/05/2018- 2.86  Interim history: Discussed the use of AI scribe software for clinical note transcription with the patient, who gave verbal consent to proceed.  The patient, with a history of asthma and COPD, reports several recent exacerbations. 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 He also spends time in the woods, which may contribute to his symptoms. He has started receiving [facility] [place] for the past two months. He is also on Trelegy and albuterol for his respiratory conditions, using albuterol a few times a day, but not daily, approximately three to four times a week. He lives in an upstairs apartment and works in Production designer, theatre/television/film, which involves climbing flights of stairs. He quit smoking  in 2017 and is due for a lung scan, which was postponed. He has small, stable lung nodules. He also saw an ENT for sinus issues in 2023, which have not recurred.   Outpatient Encounter Medications as of 10/12/2023  Medication Sig   albuterol (PROVENTIL) (2.5 MG/3ML) 0.083% nebulizer solution Take 3 mLs (2.5 mg total) by nebulization every 6 (six) hours as needed for wheezing or shortness of breath.   albuterol (VENTOLIN HFA) 108 (90 Base) MCG/ACT inhaler INHALE 2 PUFFS BY MOUTH EVERY 4 HOURS AS NEEDED FOR WHEEZE OR FOR SHORTNESS OF BREATH   EPINEPHrine 0.3 mg/0.3 mL IJ SOAJ injection Inject 0.3 mg into the muscle as needed for anaphylaxis.   levocetirizine (XYZAL) 5 MG tablet Take 1 tablet (5 mg total) by mouth every evening.   montelukast (SINGULAIR) 10 MG tablet TAKE 1 TABLET BY MOUTH EVERYDAY AT BEDTIME   Olopatadine HCl (PATADAY) 0.2 % SOLN Place 1 drop into both eyes daily as needed.   pantoprazole (PROTONIX) 40 MG tablet TAKE 1 TABLET BY MOUTH EVERY DAY   TRELEGY ELLIPTA 200-62.5-25 MCG/ACT AEPB INHALE 1 PUFF BY MOUTH EVERY DAY   [DISCONTINUED] famotidine (PEPCID) 20 MG tablet Take 1 tab twice daily day before and day of [facility] appt (Patient not taking: Reported on 10/12/2023)   Facility-Administered Encounter Medications as of 10/12/2023  Medication   EPINEPHrine (EPI-PEN) injection 0.3 mg   methylPREDNISolone acetate (DEPO-MEDROL) injection 80 mg   Physical Exam: Blood pressure 132/81, pulse 91, temperature 97.9 F (36.6 C), temperature source Oral, height 5\' 6"  (1.676 m), weight 178 lb 9.6 oz (81  kg), SpO2 94%. Gen:      No acute distress HEENT:  EOMI, sclera anicteric Neck:     No masses; no thyromegaly Lungs:    Clear to auscultation bilaterally; normal respiratory effort CV:         Regular rate and rhythm; no murmurs Abd:      + bowel sounds; soft, non-tender; no palpable masses, no distension Ext:    No edema; adequate peripheral perfusion Skin:      Warm and dry; no  rash Neuro: alert and oriented x 3 Psych: normal mood and affect   Data Reviewed: Imaging: Chest x-ray 09/22/2018- clear lungs with no airspace consolidation or effusion.  No active cardiopulmonary disease. CT lung cancer screening 10/09/2022-stable pulmonary nodule, mild emphysema I had reviewed the images personally.  PFTs: 12/05/2018 FVC 3.43 [78%), FEV1 1.77 [52%], F/F 52, TLC 116, DLCO 82% Severe obstruction with air trapping and bronchodilator response  FENO 12/05/2018-32  Labs: CBC 09/22/2018-WBC 8.9, eos 3%, absolute eosinophil count 267 CBC 11/19/2018-WBC 12.2, eos 1.8%, absolute eosinophil count 220 IgE 11/19/2018- 56  Assessment:  Asthma/COPD Recent exacerbations likely due to environmental triggers (construction dust, mildew at work). Currently on Trelegy and Albuterol (used 3-4 times per week). Recently started allergy shots. -Continue current inhalers and allergy shots. -Reevaluate in 6 months or sooner if exacerbations increase.  May need to go on Dupixent if exacerbations continue as he has elevated peripheral eosinophils  Lung Nodules Stable on previous screening CT. Next screening CT scheduled for 10/23/2023. -Continue with scheduled CT scan.  General Health Maintenance -Continue working and maintaining smoke-free lifestyle. -Consider Dupixent if allergy shots do not control symptoms.   Plan/Recommendations: Trelegy Allergy shots Annual low-dose screening CT chest.  Chilton Greathouse MD [facility] 10/12/2023, 4:16 PM  CC: No ref. provider found

## 2023-10-16 ENCOUNTER — Ambulatory Visit (INDEPENDENT_AMBULATORY_CARE_PROVIDER_SITE_OTHER): Payer: BC Managed Care – PPO

## 2023-10-16 DIAGNOSIS — J309 Allergic rhinitis, unspecified: Secondary | ICD-10-CM

## 2023-10-23 ENCOUNTER — Ambulatory Visit (INDEPENDENT_AMBULATORY_CARE_PROVIDER_SITE_OTHER): Payer: Self-pay

## 2023-10-23 ENCOUNTER — Ambulatory Visit
Admission: RE | Admit: 2023-10-23 | Discharge: 2023-10-23 | Disposition: A | Discharge status: Home / Self Care | Payer: BC Managed Care – PPO | Source: Ambulatory Visit | Attending: Acute Care | Admitting: Acute Care

## 2023-10-23 DIAGNOSIS — Z87891 Personal history of nicotine dependence: Secondary | ICD-10-CM | POA: Diagnosis not present

## 2023-10-23 DIAGNOSIS — Z122 Encounter for screening for malignant neoplasm of respiratory organs: Secondary | ICD-10-CM

## 2023-10-23 DIAGNOSIS — J309 Allergic rhinitis, unspecified: Secondary | ICD-10-CM

## 2023-10-31 ENCOUNTER — Ambulatory Visit (INDEPENDENT_AMBULATORY_CARE_PROVIDER_SITE_OTHER): Payer: BC Managed Care – PPO | Admitting: *Deleted

## 2023-10-31 DIAGNOSIS — J309 Allergic rhinitis, unspecified: Secondary | ICD-10-CM

## 2023-11-06 ENCOUNTER — Ambulatory Visit (INDEPENDENT_AMBULATORY_CARE_PROVIDER_SITE_OTHER): Payer: BC Managed Care – PPO

## 2023-11-06 DIAGNOSIS — J309 Allergic rhinitis, unspecified: Secondary | ICD-10-CM

## 2023-11-07 ENCOUNTER — Telehealth: Payer: Self-pay

## 2023-11-07 DIAGNOSIS — R911 Solitary pulmonary nodule: Secondary | ICD-10-CM

## 2023-11-07 DIAGNOSIS — F17210 Nicotine dependence, cigarettes, uncomplicated: Secondary | ICD-10-CM

## 2023-11-07 DIAGNOSIS — Z122 Encounter for screening for malignant neoplasm of respiratory organs: Secondary | ICD-10-CM

## 2023-11-07 DIAGNOSIS — Z87891 Personal history of nicotine dependence: Secondary | ICD-10-CM

## 2023-11-07 NOTE — Telephone Encounter (Signed)
 Spoke with patient and reviewed CT results. Advised multiple small pulmonary nodules are again noted, largest of which is in the posteromedial right upper lobe where there is a new nodule derived mean diameter 5.5 mm. Advised radiology recommends 6 month f/u. Patient is in agreement. No questions. CT order placed for 6 month f/u.

## 2023-11-10 ENCOUNTER — Other Ambulatory Visit: Payer: Self-pay | Admitting: Nurse Practitioner

## 2023-11-10 DIAGNOSIS — K219 Gastro-esophageal reflux disease without esophagitis: Secondary | ICD-10-CM

## 2023-11-15 DIAGNOSIS — D225 Melanocytic nevi of trunk: Secondary | ICD-10-CM | POA: Diagnosis not present

## 2023-11-15 DIAGNOSIS — L821 Other seborrheic keratosis: Secondary | ICD-10-CM | POA: Diagnosis not present

## 2023-11-16 ENCOUNTER — Ambulatory Visit (INDEPENDENT_AMBULATORY_CARE_PROVIDER_SITE_OTHER): Payer: BC Managed Care – PPO

## 2023-11-16 DIAGNOSIS — J309 Allergic rhinitis, unspecified: Secondary | ICD-10-CM

## 2023-11-22 ENCOUNTER — Ambulatory Visit (INDEPENDENT_AMBULATORY_CARE_PROVIDER_SITE_OTHER): Payer: Self-pay

## 2023-11-22 DIAGNOSIS — J309 Allergic rhinitis, unspecified: Secondary | ICD-10-CM | POA: Diagnosis not present

## 2023-12-06 ENCOUNTER — Ambulatory Visit (INDEPENDENT_AMBULATORY_CARE_PROVIDER_SITE_OTHER): Payer: Self-pay

## 2023-12-06 DIAGNOSIS — J309 Allergic rhinitis, unspecified: Secondary | ICD-10-CM | POA: Diagnosis not present

## 2023-12-18 ENCOUNTER — Encounter (HOSPITAL_COMMUNITY): Payer: Self-pay

## 2023-12-18 ENCOUNTER — Observation Stay (HOSPITAL_COMMUNITY)
Admission: EM | Admit: 2023-12-18 | Discharge: 2023-12-20 | Disposition: A | Discharge status: Home / Self Care | Payer: BC Managed Care – PPO | Attending: Internal Medicine | Admitting: Internal Medicine

## 2023-12-18 ENCOUNTER — Emergency Department (HOSPITAL_COMMUNITY): Payer: BC Managed Care – PPO

## 2023-12-18 ENCOUNTER — Other Ambulatory Visit: Payer: Self-pay

## 2023-12-18 DIAGNOSIS — J3089 Other allergic rhinitis: Secondary | ICD-10-CM | POA: Diagnosis not present

## 2023-12-18 DIAGNOSIS — R509 Fever, unspecified: Secondary | ICD-10-CM | POA: Diagnosis not present

## 2023-12-18 DIAGNOSIS — J441 Chronic obstructive pulmonary disease with (acute) exacerbation: Secondary | ICD-10-CM | POA: Diagnosis present

## 2023-12-18 DIAGNOSIS — Z79899 Other long term (current) drug therapy: Secondary | ICD-10-CM | POA: Insufficient documentation

## 2023-12-18 DIAGNOSIS — J4541 Moderate persistent asthma with (acute) exacerbation: Principal | ICD-10-CM | POA: Insufficient documentation

## 2023-12-18 DIAGNOSIS — Z7951 Long term (current) use of inhaled steroids: Secondary | ICD-10-CM | POA: Insufficient documentation

## 2023-12-18 DIAGNOSIS — Z1152 Encounter for screening for COVID-19: Secondary | ICD-10-CM | POA: Diagnosis not present

## 2023-12-18 DIAGNOSIS — J101 Influenza due to other identified influenza virus with other respiratory manifestations: Secondary | ICD-10-CM | POA: Diagnosis not present

## 2023-12-18 DIAGNOSIS — Z87891 Personal history of nicotine dependence: Secondary | ICD-10-CM | POA: Diagnosis not present

## 2023-12-18 DIAGNOSIS — R0602 Shortness of breath: Secondary | ICD-10-CM | POA: Diagnosis not present

## 2023-12-18 DIAGNOSIS — K219 Gastro-esophageal reflux disease without esophagitis: Secondary | ICD-10-CM | POA: Insufficient documentation

## 2023-12-18 LAB — COMPREHENSIVE METABOLIC PANEL
ALT: 50 U/L — ABNORMAL HIGH (ref 0–44)
AST: 43 U/L — ABNORMAL HIGH (ref 15–41)
Albumin: 3.4 g/dL — ABNORMAL LOW (ref 3.5–5.0)
Alkaline Phosphatase: 55 U/L (ref 38–126)
Anion gap: 10 (ref 5–15)
BUN: 19 mg/dL (ref 6–20)
CO2: 23 mmol/L (ref 22–32)
Calcium: 9.1 mg/dL (ref 8.9–10.3)
Chloride: 105 mmol/L (ref 98–111)
Creatinine, Ser: 1.06 mg/dL (ref 0.61–1.24)
GFR, Estimated: >60 mL/min (ref >60)
Glucose, Bld: 130 mg/dL — ABNORMAL HIGH (ref 70–99)
Potassium: 4 mmol/L (ref 3.5–5.1)
Sodium: 138 mmol/L (ref 135–145)
Total Bilirubin: 0.6 mg/dL (ref 0.0–1.2)
Total Protein: 6.7 g/dL (ref 6.5–8.1)

## 2023-12-18 LAB — CBC
HCT: 44.8 % (ref 39.0–52.0)
Hemoglobin: 15 g/dL (ref 13.0–17.0)
MCH: 32.1 pg (ref 26.0–34.0)
MCHC: 33.5 g/dL (ref 30.0–36.0)
MCV: 95.7 fL (ref 80.0–100.0)
Platelets: 214 10*3/uL (ref 150–400)
RBC: 4.68 MIL/uL (ref 4.22–5.81)
RDW: 14.2 % (ref 11.5–15.5)
WBC: 6.6 10*3/uL (ref 4.0–10.5)
nRBC: 0 % (ref 0.0–0.2)

## 2023-12-18 LAB — RESP PANEL BY RT-PCR (RSV, FLU A&B, COVID)  RVPGX2
Influenza A by PCR: POSITIVE — AB
Influenza B by PCR: NEGATIVE
Resp Syncytial Virus by PCR: NEGATIVE
SARS Coronavirus 2 by RT PCR: NEGATIVE

## 2023-12-18 LAB — HIV ANTIBODY (ROUTINE TESTING W REFLEX): HIV Screen 4th Generation wRfx: NONREACTIVE

## 2023-12-18 MED ORDER — GUAIFENESIN-DM 100-10 MG/5ML PO SYRP
15.0000 mL | ORAL_SOLUTION | Freq: Four times a day (QID) | ORAL | Status: DC | PRN
  Administered 2023-12-18: 15 mL via ORAL
  Filled 2023-12-18: qty 15

## 2023-12-18 MED ORDER — FLUTICASONE FUROATE-VILANTEROL 200-25 MCG/ACT IN AEPB
1.0000 | INHALATION_SPRAY | Freq: Every day | RESPIRATORY_TRACT | Status: DC
  Administered 2023-12-19 – 2023-12-20 (×2): 1 via RESPIRATORY_TRACT
  Filled 2023-12-18: qty 28

## 2023-12-18 MED ORDER — IPRATROPIUM-ALBUTEROL 0.5-2.5 (3) MG/3ML IN SOLN: 3.0000 mL | RESPIRATORY_TRACT | Status: DC

## 2023-12-18 MED ORDER — MONTELUKAST SODIUM 10 MG PO TABS
10.0000 mg | ORAL_TABLET | Freq: Every day | ORAL | Status: DC
  Administered 2023-12-18 – 2023-12-19 (×2): 10 mg via ORAL
  Filled 2023-12-18 (×2): qty 1

## 2023-12-18 MED ORDER — PANTOPRAZOLE SODIUM 40 MG PO TBEC
40.0000 mg | DELAYED_RELEASE_TABLET | Freq: Every day | ORAL | Status: DC
  Administered 2023-12-18 – 2023-12-20 (×3): 40 mg via ORAL
  Filled 2023-12-18 (×3): qty 1

## 2023-12-18 MED ORDER — ACETAMINOPHEN 650 MG RE SUPP: 650.0000 mg | Freq: Four times a day (QID) | RECTAL | Status: DC | PRN

## 2023-12-18 MED ORDER — OSELTAMIVIR PHOSPHATE 75 MG PO CAPS
75.0000 mg | ORAL_CAPSULE | Freq: Two times a day (BID) | ORAL | Status: DC
  Administered 2023-12-18 – 2023-12-20 (×5): 75 mg via ORAL
  Filled 2023-12-18 (×5): qty 1

## 2023-12-18 MED ORDER — IPRATROPIUM BROMIDE 0.02 % IN SOLN
0.5000 mg | RESPIRATORY_TRACT | Status: DC
  Administered 2023-12-18 (×2): 0.5 mg via RESPIRATORY_TRACT
  Filled 2023-12-18 (×2): qty 2.5

## 2023-12-18 MED ORDER — RIVAROXABAN 10 MG PO TABS
10.0000 mg | ORAL_TABLET | Freq: Every day | ORAL | Status: DC
  Administered 2023-12-18 – 2023-12-20 (×3): 10 mg via ORAL
  Filled 2023-12-18 (×3): qty 1

## 2023-12-18 MED ORDER — AZITHROMYCIN 500 MG PO TABS
500.0000 mg | ORAL_TABLET | Freq: Every day | ORAL | Status: AC
  Administered 2023-12-18 – 2023-12-20 (×3): 500 mg via ORAL
  Filled 2023-12-18 (×2): qty 1
  Filled 2023-12-18: qty 2

## 2023-12-18 MED ORDER — UMECLIDINIUM BROMIDE 62.5 MCG/ACT IN AEPB
1.0000 | INHALATION_SPRAY | Freq: Every day | RESPIRATORY_TRACT | Status: DC
  Administered 2023-12-18 – 2023-12-20 (×3): 1 via RESPIRATORY_TRACT
  Filled 2023-12-18: qty 7

## 2023-12-18 MED ORDER — ACETAMINOPHEN 325 MG PO TABS: 650.0000 mg | ORAL_TABLET | Freq: Four times a day (QID) | ORAL | Status: DC | PRN

## 2023-12-18 MED ORDER — ALBUTEROL SULFATE (2.5 MG/3ML) 0.083% IN NEBU
5.0000 mg | INHALATION_SOLUTION | Freq: Once | RESPIRATORY_TRACT | Status: AC
  Administered 2023-12-18: 5 mg via RESPIRATORY_TRACT
  Filled 2023-12-18: qty 6

## 2023-12-18 MED ORDER — ALBUTEROL SULFATE (2.5 MG/3ML) 0.083% IN NEBU
10.0000 mg | INHALATION_SOLUTION | Freq: Once | RESPIRATORY_TRACT | Status: AC
  Administered 2023-12-18: 10 mg via RESPIRATORY_TRACT
  Filled 2023-12-18: qty 12

## 2023-12-18 MED ORDER — IPRATROPIUM-ALBUTEROL 0.5-2.5 (3) MG/3ML IN SOLN
3.0000 mL | Freq: Four times a day (QID) | RESPIRATORY_TRACT | Status: DC
  Administered 2023-12-18 – 2023-12-19 (×3): 3 mL via RESPIRATORY_TRACT
  Filled 2023-12-18 (×3): qty 3

## 2023-12-18 MED ORDER — PREDNISONE 20 MG PO TABS
40.0000 mg | ORAL_TABLET | Freq: Every day | ORAL | Status: DC
  Administered 2023-12-19 – 2023-12-20 (×2): 40 mg via ORAL
  Filled 2023-12-18 (×3): qty 2

## 2023-12-18 MED ORDER — ADULT MULTIVITAMIN W/MINERALS CH
1.0000 | ORAL_TABLET | Freq: Every day | ORAL | Status: DC
  Administered 2023-12-18 – 2023-12-20 (×3): 1 via ORAL
  Filled 2023-12-18 (×3): qty 1

## 2023-12-18 MED ORDER — PREDNISONE 20 MG PO TABS
60.0000 mg | ORAL_TABLET | Freq: Once | ORAL | Status: AC
Start: 2023-12-18 — End: 2023-12-18
  Administered 2023-12-18: 60 mg via ORAL
  Filled 2023-12-18: qty 3

## 2023-12-18 NOTE — Hospital Course (Signed)
 You came to the hospital for difficulty breathing and you were diagnosed with a COPD exacerbation.  We treated you with Tamiflu and breathing treatments    *For your COPD -We have started you on these following medications:  -Prednisone 40mg  daily, continue this until 2/28  -Azithromycin 500mg  daily, continue this until 2/26  -Tamiflu, take one tablet every morning and one tablet every evening until 2/28  -Please see ***in 7 to 10 days     Follow-up appointments: Please visit your family doctor in 7 to 10 days  If you have any questions or concerns please feel free to call: Internal medicine clinic at [phone]   If you have any of these following symptoms, please call us or seek care at an emergency department: -Chest Pain -Difficulty Breathing -Syncope (passing out) -Drooping of face -Slurred speech -Sudden weakness in your leg or arm -Fever -Chills   We are glad that you are feeling better, it was a pleasure to care for you!  Faith Rogue DO   ___________________________   COPD exacerbation 2/2 Influenza A Patient presented to the emergency department hypoxic requiring supplemental oxygen via nasal cannula. Physical exam reveals expiratory wheezes and tachypnea. I have a lower suspicion for pneumonia at this time, he is afebrile, there is no leukocytosis, and there is no consolidation on CXR. His COPD exacerbation is due to an acute infection with influenza A.  Plan: -Tamiflu 75mg  BID for 5 days -Prednisone 40mg  daily for a total of 5 days  -Azithromycin 500mg  daily for three days  -Ipratropium nebulizer every 4 hours  -Start breo-ellipta and incruse-ellipta on 2/24, patient used home inhaler this morning   -Montelukast 10mg  daily  -Ambulatory pulse oximetry tomorrow morning    GERD Continue Protonix 40mg  daily

## 2023-12-18 NOTE — ED Triage Notes (Addendum)
 Pt c/o SOBx2-3d. Pt states pt's girlfriend tested Positive for flu A. Pt c/o feverx4d. Pt states highest fever was 102. Pt c/o productive cough w/clear yellow mucous. PT states left sided chest hurts with cough. Pt able to speak in complete sentences.

## 2023-12-18 NOTE — ED Provider Notes (Signed)
 [place] EMERGENCY DEPARTMENT AT [facility] Provider Note   CSN: [id number] Arrival date & time: 12/18/23  4098     History  Chief Complaint  Patient presents with   Shortness of Breath   Fever    [name] is a 53 y.o. male.  HPI 53 year old male known history of COPD presents today with cough, congestion, dyspnea.  He states that his girlfriend had flu last week.  He began having some flulike symptoms on Wednesday.  He had some coughing, body aches, and dyspnea.  He has been using his inhaler every couple hours.  He had a fever up to 102.  He did not have his flu shot this year.  He had a previous history of smoking but had quit for several years.  He began again about a month ago and then quit on Wednesday when he began having the symptoms.  His cough is productive to clear to yellow mucus.  Pain in his chest occurs with coughing.    Home Medications Prior to Admission medications   Medication Sig Start Date End Date Taking? Authorizing Provider  albuterol (PROVENTIL) (2.5 MG/3ML) 0.083% nebulizer solution Take 3 mLs (2.5 mg total) by nebulization every 6 (six) hours as needed for wheezing or shortness of breath. 06/29/23   Cobb, Ruby Cola, NP  albuterol (VENTOLIN HFA) 108 (90 Base) MCG/ACT inhaler INHALE 2 PUFFS BY MOUTH EVERY 4 HOURS AS NEEDED FOR WHEEZE OR FOR SHORTNESS OF BREATH 07/10/23   Cobb, Ruby Cola, NP  EPINEPHrine 0.3 mg/0.3 mL IJ SOAJ injection Inject 0.3 mg into the muscle as needed for anaphylaxis. 03/21/23   Ferol Luz, MD  levocetirizine (XYZAL) 5 MG tablet Take 1 tablet (5 mg total) by mouth every evening. 02/02/23   Verlee Monte, MD  montelukast (SINGULAIR) 10 MG tablet TAKE 1 TABLET BY MOUTH EVERYDAY AT BEDTIME 10/03/23   Mannam, Praveen, MD  Olopatadine HCl (PATADAY) 0.2 % SOLN Place 1 drop into both eyes daily as needed. 02/02/23   Verlee Monte, MD  pantoprazole (PROTONIX) 40 MG tablet TAKE 1 TABLET BY MOUTH EVERY DAY 11/10/23    Cobb, Ruby Cola, NP  TRELEGY ELLIPTA 200-62.5-25 MCG/ACT AEPB INHALE 1 PUFF BY MOUTH EVERY DAY 10/03/23   Chilton Greathouse, MD      Allergies    Patient has no known allergies.    Review of Systems   Review of Systems  Physical Exam Updated Vital Signs BP 123/80   Pulse 88   Temp 97.6 F (36.4 C)   Resp 14   Ht 1.676 m (5\' 6" )   Wt 81 kg   SpO2 (!) 86%   BMI 28.82 kg/m  Physical Exam Vitals reviewed.  HENT:     Head: Normocephalic.     Mouth/Throat:     Mouth: Mucous membranes are moist.  Eyes:     Pupils: Pupils are equal, round, and reactive to light.  Cardiovascular:     Rate and Rhythm: Normal rate and regular rhythm.  Pulmonary:     Effort: Tachypnea present.     Breath sounds: Examination of the right-lower field reveals rhonchi. Examination of the left-lower field reveals rhonchi. Wheezing and rhonchi present.  Abdominal:     Palpations: Abdomen is soft.  Musculoskeletal:        General: Normal range of motion.  Skin:    Capillary Refill: Capillary refill takes less than 2 seconds.  Neurological:     General: No focal deficit present.  Mental Status: He is alert.  Psychiatric:        Mood and Affect: Mood normal.     ED Results / Procedures / Treatments   Labs (all labs ordered are listed, but only abnormal results are displayed) Labs Reviewed  RESP PANEL BY RT-PCR (RSV, FLU A&B, COVID)  RVPGX2 - Abnormal; Notable for the following components:      Result Value   Influenza A by PCR POSITIVE (*)    All other components within normal limits  COMPREHENSIVE METABOLIC PANEL - Abnormal; Notable for the following components:   Glucose, Bld 130 (*)    Albumin 3.4 (*)    AST 43 (*)    ALT 50 (*)    All other components within normal limits  CBC    EKG EKG Interpretation Date/Time:  Monday December 18 2023 08:03:40 EST Ventricular Rate:  100 PR Interval:  122 QRS Duration:  128 QT Interval:  344 QTC Calculation: 443 R Axis:   100  Text  Interpretation: Normal sinus rhythm Right bundle branch block Abnormal ECG When compared with ECG of 22-Sep-2018 15:53, PREVIOUS ECG IS PRESENT Confirmed by Margarita Grizzle [phone]) on 12/18/2023 8:09:18 AM  Radiology DG Chest 1 View Result Date: 12/18/2023 CLINICAL DATA:  Shortness of breath and fever EXAM: CHEST  1 VIEW COMPARISON:  Chest radiograph dated 01/21/2020 FINDINGS: Normal lung volumes. No focal consolidations. No pleural effusion or pneumothorax. The heart size and mediastinal contours are within normal limits. No acute osseous abnormality. IMPRESSION: No focal consolidations. Electronically Signed   By: Agustin Cree M.D.   On: 12/18/2023 08:38    Procedures Procedures    Medications Ordered in ED Medications  albuterol (PROVENTIL) (2.5 MG/3ML) 0.083% nebulizer solution 5 mg (5 mg Nebulization Given 12/18/23 0942)  predniSONE (DELTASONE) tablet 60 mg (60 mg Oral Given 12/18/23 0942)  albuterol (PROVENTIL) (2.5 MG/3ML) 0.083% nebulizer solution 10 mg (10 mg Nebulization Given 12/18/23 1135)    ED Course/ Medical Decision Making/ A&P Clinical Course as of 12/18/23 1144  Mon Dec 18, 2023  0929 CBC is reviewed interpreted and is within normal limits Complete metabolic panel is reviewed interpreted significant for elevated AST and ALT at 43 and 50 otherwise within normal limits [DR]  0930 X-Alec Jaros is reviewed and interpreted no evidence of acute abnormality is noted and radiologist interpretation concurs [DR]  0944 Flu test reviewed and positive [DR]  0944 CBC reviewed interpreted and within normal limits [DR]    Clinical Course User Index [DR] Margarita Grizzle, MD                                 Medical Decision Making Amount and/or Complexity of Data Reviewed Labs: ordered. Radiology: ordered.  Risk Prescription drug management.  11:44 AM Patient rechecked and is on oxygen.  He continues tachypneic with increased wheezing Patient has received prednisone Another albuterol 10 mg is  ordered  16:35 AM 53 year old male known history of COPD presents today complaining of dyspnea.  Patient had been smoking again.  He has recently quit and he began having symptoms.  His girlfriend was diagnosed with flu.  He has had similar symptoms.  He is coughing and short of breath.  He has wheezing here on exam.  He has dyspnea with any exertion.  He has a new oxygen requirement with sats down into 86% requiring oxygen.  He was treated here with steroids and bronchodilators.  He  has continued to have wheezing and new oxygen requirement. Discussed with Dr. August Saucer on for IM who will see patient for admission       Final Clinical Impression(s) / ED Diagnoses Final diagnoses:  Influenza A  COPD exacerbation [facility])    Rx / DC Orders ED Discharge Orders     None         Margarita Grizzle, MD 12/18/23 1144

## 2023-12-18 NOTE — Progress Notes (Signed)
 VIAL ONE MADE 12-18-23. EXP 12-17-24

## 2023-12-18 NOTE — H&P (Signed)
 Date: 12/18/2023               Patient Name:  [name] MRN: [id number]  DOB: 1971-06-29 Age / Sex: 53 y.o., male   PCP: Pcp, No         Medical Service: Internal Medicine Teaching Service         Attending Physician: Dr. Reymundo Poll, MD      First Contact: Dr. Faith Rogue, DO Pager [phone]    Second Contact: Dr. Champ Mungo, DO Pager [phone]         After Hours (After 5p/  First Contact Pager: [phone]  weekends / holidays): Second Contact Pager: [phone]   SUBJECTIVE   Chief Complaint: Shortness of breath  History of Present Illness: [name] is a 53 year old male with a past medical history of COPD who presented to the emergency department for shortness of breath that began on Thursday.  He stated that he started feeling unwell on Wednesday and then began to feel short of breath on Thursday. The shortness of breath has progressively worsened and has impacted his daily life. He did report some fevers and chills but denied nausea, vomiting, and diarrhea. His girlfriend recently had influenza A. He does not use supplemental oxygen at baseline. He currently feels better than when first presented to the emergency department.   Of note, he recently stopped smoking. He has a 30 pack year history. He stopped about 10 years ago and recently started again one month ago. He stopped smoking again this past Wednesday.   Review of Systems: A complete ROS was negative except as per HPI.   ED Course:  Past Medical History: COPD GERD   Meds:  Albuterol prn Epinephrine pen  Xyzal prn  Singulair  Protonix 40mg   TRELEGY ELLIPTA 200-62.5-25 MCG/ACT 1 puff per day    Allergies: Numerous environmental allergies  Past Surgical History:  Procedure Laterality Date   cervical fracture repair     SHOULDER ARTHROSCOPY WITH ROTATOR CUFF REPAIR AND SUBACROMIAL DECOMPRESSION Right 02/14/2020   Procedure: SHOULDER ARTHROSCOPY WITH MINI OPEN ROTATOR CUFF REPAIR AND  SUBACROMIAL DECOMPRESSION AND DEBRIDEMENT;  Surgeon: Jene Every, MD;  Location: [facility];  Service: Orthopedics;  Laterality: Right;  90 MINS BLOCK OK- NO EXPAREL    Social:  Lives With:Girlfriend  Occupation:Maintenance  Level of Function:Independent ADLs and iADLs PCP:No PCP Substances:No alcohol or drug use, has a 30 pack year history   Family History: Mother: COPD   OBJECTIVE:   Physical Exam: Blood pressure 123/80, pulse 88, temperature 97.6 F (36.4 C), resp. rate 14, height 5\' 6"  (1.676 m), weight 81 kg, SpO2 (!) 86%.  Constitutional: sitting in bed, in no acute distress, appears stated age  Cardiovascular: tachycardic, regular rhythm, no m/r/g Pulmonary/Chest: tachypnea, diffuse expiratory wheezes on exam, SpO2% of 95% on 3L [place], able to speak in sentences  Abdominal: soft, non-tender, non-distended Neurological: alert & awake, participating in conversation  MSK: no gross abnormalities. No pitting edema Skin: warm and dry Psych: Normal mood and affect, very pleasant   Labs:    Latest Ref Rng & Units 12/18/2023    8:04 AM 06/29/2023    3:44 PM 07/11/2022    4:36 PM  CBC  WBC 4.0 - 10.5 K/uL 6.6  8.9  8.9   Hemoglobin 13.0 - 17.0 g/dL 64.4  03.4  74.2   Hematocrit 39.0 - 52.0 % 44.8  43.4  39.8   Platelets 150 - 400 K/uL 214  338.0  292.0         Latest Ref Rng & Units 12/18/2023    8:04 AM 04/07/2022    9:24 AM 02/13/2020    8:13 AM  CMP  Glucose 70 - 99 mg/dL 409  98  98   BUN 6 - 20 mg/dL 19  22  22    Creatinine 0.61 - 1.24 mg/dL 8.11  9.14  7.82   Sodium 135 - 145 mmol/L 138  140  137   Potassium 3.5 - 5.1 mmol/L 4.0  4.6  4.6   Chloride 98 - 111 mmol/L 105  103  108   CO2 22 - 32 mmol/L 23  23  22    Calcium 8.9 - 10.3 mg/dL 9.1  9.2  9.3   Total Protein 6.5 - 8.1 g/dL 6.7  6.5    Total Bilirubin 0.0 - 1.2 mg/dL 0.6  0.2    Alkaline Phos 38 - 126 U/L 55  62    AST 15 - 41 U/L 43  23    ALT 0 - 44 U/L 50  29       Imaging: DG Chest 1 View Result  Date: 12/18/2023 CLINICAL DATA:  Shortness of breath and fever EXAM: CHEST  1 VIEW COMPARISON:  Chest radiograph dated 01/21/2020 FINDINGS: Normal lung volumes. No focal consolidations. No pleural effusion or pneumothorax. The heart size and mediastinal contours are within normal limits. No acute osseous abnormality. IMPRESSION: No focal consolidations. Electronically Signed   By: Agustin Cree M.D.   On: 12/18/2023 08:38      EKG: personally reviewed my interpretation is sinus rhythm with HR of 100, RBBB present that was also present on prior EKG in November 2019.  ASSESSMENT & PLAN:   Assessment & Plan by Problem: Principal Problem:   COPD exacerbation (HCC)   [name] is a 53 y.o. person living with a history of COPD who presented with shortness of breath and admitted for a COPD exacerbation 2/2 influenza A  on hospital day 0  COPD exacerbation 2/2 Influenza A Patient presented to the emergency department hypoxic requiring supplemental oxygen via nasal cannula. Physical exam reveals expiratory wheezes and tachypnea. I have a lower suspicion for pneumonia at this time, he is afebrile, there is no leukocytosis, and there is no consolidation on CXR. His COPD exacerbation is due to an acute infection with influenza A.  Plan: -Tamiflu 75mg  BID for 5 days -Prednisone 40mg  daily for a total of 5 days  -Azithromycin 500mg  daily for three days  -Ipratropium nebulizer every 4 hours  -Start breo-ellipta and incruse-ellipta on 2/24, patient used home inhaler this morning   -Montelukast 10mg  daily  -Ambulatory pulse oximetry tomorrow morning   GERD Continue Protonix 40mg  daily    Diet: Normal VTE: NOAC Code: Full  Prior to Admission Living Arrangement: Home, living with girlfriend  Anticipated Discharge Location: Home Barriers to Discharge: Medical treatment of COPD exacerbation   Dispo: Admit patient to Observation with expected length of stay less than 2 midnights.  Signed:    Faith Rogue, DO Internal Medicine Resident PGY-1 12/18/2023, 12:48 PM   Please contact the on call pager after 5 pm and on weekends at [phone].

## 2023-12-19 DIAGNOSIS — J101 Influenza due to other identified influenza virus with other respiratory manifestations: Principal | ICD-10-CM | POA: Insufficient documentation

## 2023-12-19 DIAGNOSIS — J441 Chronic obstructive pulmonary disease with (acute) exacerbation: Secondary | ICD-10-CM | POA: Diagnosis not present

## 2023-12-19 LAB — CBC
HCT: 39.4 % (ref 39.0–52.0)
Hemoglobin: 13.5 g/dL (ref 13.0–17.0)
MCH: 31.7 pg (ref 26.0–34.0)
MCHC: 34.3 g/dL (ref 30.0–36.0)
MCV: 92.5 fL (ref 80.0–100.0)
Platelets: 209 10*3/uL (ref 150–400)
RBC: 4.26 MIL/uL (ref 4.22–5.81)
RDW: 14 % (ref 11.5–15.5)
WBC: 6.1 10*3/uL (ref 4.0–10.5)
nRBC: 0 % (ref 0.0–0.2)

## 2023-12-19 LAB — COMPREHENSIVE METABOLIC PANEL
ALT: 43 U/L (ref 0–44)
AST: 32 U/L (ref 15–41)
Albumin: 3 g/dL — ABNORMAL LOW (ref 3.5–5.0)
Alkaline Phosphatase: 44 U/L (ref 38–126)
Anion gap: 8 (ref 5–15)
BUN: 14 mg/dL (ref 6–20)
CO2: 26 mmol/L (ref 22–32)
Calcium: 8.9 mg/dL (ref 8.9–10.3)
Chloride: 106 mmol/L (ref 98–111)
Creatinine, Ser: 0.9 mg/dL (ref 0.61–1.24)
GFR, Estimated: >60 mL/min (ref >60)
Glucose, Bld: 115 mg/dL — ABNORMAL HIGH (ref 70–99)
Potassium: 3.8 mmol/L (ref 3.5–5.1)
Sodium: 140 mmol/L (ref 135–145)
Total Bilirubin: 0.5 mg/dL (ref 0.0–1.2)
Total Protein: 6 g/dL — ABNORMAL LOW (ref 6.5–8.1)

## 2023-12-19 MED ORDER — IPRATROPIUM-ALBUTEROL 0.5-2.5 (3) MG/3ML IN SOLN
3.0000 mL | RESPIRATORY_TRACT | Status: DC
  Administered 2023-12-19 – 2023-12-20 (×4): 3 mL via RESPIRATORY_TRACT
  Filled 2023-12-19 (×3): qty 3

## 2023-12-19 NOTE — TOC CM/SW Note (Signed)
 Transition of Care [facility]) - Inpatient Brief Assessment   Patient Details  Name: [name] MRN: [id number] Date of Birth: 1970/10/30  Transition of Care [facility]) CM/SW Contact:    Tom-Johnson, Hershal Coria, RN Phone Number: 12/19/2023, 2:09 PM   Clinical Narrative:  Patient presented to the ED with Shortness of Breath, Productive Cough with Lt sided Chest pain and Fever at 102 . Admitted with COPD Exacerbation 2/2 Influenza A. Patient's girlfriend was recently dx with Influenza A.  Currently on 2L O2 does not use home O2. Oral abx, Tamiflu, Neb tx, Inhalers and Prednisone.   From home with girlfriend. Patient's mother and his son supportive. Independent with care, employed and drive self.  Does not have a PCP, requested to schedule with Internal Medicine to continue care with hospital Attending, info on AVS. Uses [facility] on [address]  Patient not Medically ready for discharge.  CM will continue to follow as patient progresses with care towards discharge.               Transition of Care Asessment: Insurance and Status: Insurance coverage has been reviewed Patient has primary care physician: No (Scheduled with Internal Medicine.) Home environment has been reviewed: Yes Prior level of function:: Independent Prior/Current Home Services: No current home services Social Drivers of Health Review: SDOH reviewed no interventions necessary Readmission risk has been reviewed: Yes Transition of care needs: transition of care needs identified, TOC will continue to follow

## 2023-12-19 NOTE — Plan of Care (Signed)
   Problem: Education: Goal: Knowledge of General Education information will improve Description: Including pain rating scale, medication(s)/side effects and non-pharmacologic comfort measures Outcome: Progressing   Problem: Activity: Goal: Risk for activity intolerance will decrease Outcome: Progressing   Problem: Nutrition: Goal: Adequate nutrition will be maintained Outcome: Progressing

## 2023-12-19 NOTE — Progress Notes (Signed)
 Mobility Specialist Progress Note:   12/19/23 0900  Therapy Vitals  BP (!) 142/83  Oxygen Therapy  O2 Device Room Air  Mobility  Activity Ambulated independently in hallway  Level of Assistance Modified independent, requires aide device or extra time  Assistive Device None  Distance Ambulated (ft) 150 ft  Activity Response Tolerated well;Tolerated fair  Mobility Referral Yes  Mobility visit 1 Mobility  Mobility Specialist Start Time (ACUTE ONLY) [id number]  Mobility Specialist Stop Time (ACUTE ONLY) 0915  Mobility Specialist Time Calculation (min) (ACUTE ONLY) 11 min    Pre Mobility:  95% SpO2 During Mobility:  96% SpO2 Post Mobility:  96% SpO2  Pt received in bed and agreeable. Required no physical assistance throughout. SpO2 levels in high 90s throughout on room air, though pt c/o SOB and chest tightness. Left on EOB on 2L O2, with call bell and all needs met. RN notified.  D'Vante Earlene Plater Mobility Specialist Please contact via Special educational needs teacher or Rehab office at [phone]

## 2023-12-19 NOTE — Progress Notes (Addendum)
   Subjective: [name] is a 53 y.o. person living with a history of COPD who presented with shortness of breath and admitted for a COPD exacerbation 2/2 influenza A.   Today, he continues to have chest tightness and shortness of breath. He is not fully comfortable with going home today because he has to walk up two flights of steps at home.    Objective:  Vital signs in last 24 hours: Vitals:   12/19/23 0254 12/19/23 0645 12/19/23 0738 12/19/23 0900  BP:  125/86  (!) 142/83  Pulse:  83 85   Resp:  18 18   Temp:  98.6 F (37 C)    TempSrc:      SpO2: 96% 96% 95%   Weight:      Height:       Physical Exam: General:NAD, sitting in bed  Cardiac:RRR, no murmur appreciated  Pulmonary:Diffuse expiratory wheezes auscultated, decreased air movement on exam, able to speak in full sentences  Neuro:awake, alert, participating in conversation Skin:warm and dry  Psych:  normal mood and affect      Latest Ref Rng & Units 12/19/2023    4:21 AM 12/18/2023    8:04 AM 06/29/2023    3:44 PM  CBC  WBC 4.0 - 10.5 K/uL 6.1  6.6  8.9   Hemoglobin 13.0 - 17.0 g/dL 16.1  09.6  04.5   Hematocrit 39.0 - 52.0 % 39.4  44.8  43.4   Platelets 150 - 400 K/uL 209  214  338.0        Latest Ref Rng & Units 12/19/2023    4:21 AM 12/18/2023    8:04 AM 04/07/2022    9:24 AM  BMP  Glucose 70 - 99 mg/dL 409  811  98   BUN 6 - 20 mg/dL 14  19  22    Creatinine 0.61 - 1.24 mg/dL 9.14  7.82  9.56   BUN/Creat Ratio 9 - 20   24   Sodium 135 - 145 mmol/L 140  138  140   Potassium 3.5 - 5.1 mmol/L 3.8  4.0  4.6   Chloride 98 - 111 mmol/L 106  105  103   CO2 22 - 32 mmol/L 26  23  23    Calcium 8.9 - 10.3 mg/dL 8.9  9.1  9.2      Assessment/Plan:  Principal Problem:   COPD exacerbation (HCC)  COPD & Asthma exacerbation 2/2 Influenza A Patient presented to the emergency department hypoxic requiring supplemental oxygen via nasal cannula. He continues to feel chest tightness. He remains afebrile and was  able to walk without desaturating this morning.  Patient had decreased air flow and diffuse expiratory wheezes on exam today. We will continue to provide breathing treatments. Plan: -Tamiflu 75mg  BID for 5 days -Prednisone 40mg  daily for a total of 5 days  -Azithromycin 500mg  daily for three days  -Duo-nebulizer every 4 hours  -Continue breo-ellipta and incruse-ellipta   -Montelukast 10mg  daily  -Ambulatory pulse oximetry tomorrow morning  -Wean off supplementary oxygen for a goal of 89-92% oxygen saturation    GERD Continue Protonix 40mg  daily    Resolved Problems:  __________________________________  Code Status: Full VTE Prophylaxis:Xarelto  Diet:Regular diet  IVF:N/A Barriers to Discharge:Medical Treatment for COPD exacerbation  Dispo: Anticipated discharge in approximately 1 day(s).   Faith Rogue, DO 12/19/2023, 12:00 PM Pager: [phone] After 5pm on weekdays and 1pm on weekends: On Call pager [phone]

## 2023-12-19 NOTE — Progress Notes (Signed)
 VIAL TWO MADE 12-19-23. EXP 12-18-24

## 2023-12-19 NOTE — Progress Notes (Signed)
 Nurse requested Mobility Specialist to perform oxygen saturation test with pt which includes removing pt from oxygen both at rest and while ambulating.  Below are the results from that testing.     Patient Saturations on Room Air at Rest = spO2 95%  Patient Saturations on Room Air while Ambulating = sp02 96% .    At end of testing pt left in room on 2  Liters of oxygen.  Reported results to nurse.   D'Vante Earlene Plater Mobility Specialist Please contact via Special educational needs teacher or Rehab office at [phone]

## 2023-12-20 ENCOUNTER — Other Ambulatory Visit (HOSPITAL_COMMUNITY): Payer: Self-pay

## 2023-12-20 DIAGNOSIS — J441 Chronic obstructive pulmonary disease with (acute) exacerbation: Secondary | ICD-10-CM | POA: Diagnosis not present

## 2023-12-20 MED ORDER — OSELTAMIVIR PHOSPHATE 75 MG PO CAPS
75.0000 mg | ORAL_CAPSULE | Freq: Two times a day (BID) | ORAL | 0 refills | Status: DC
  Filled 2023-12-20: qty 5, 3d supply, fill #0

## 2023-12-20 MED ORDER — PREDNISONE 20 MG PO TABS
40.0000 mg | ORAL_TABLET | Freq: Every day | ORAL | 0 refills | Status: DC
  Filled 2023-12-20: qty 4, 2d supply, fill #0

## 2023-12-20 NOTE — Discharge Summary (Signed)
 Name: [name] MRN: [id number] DOB: 08/08/1971 53 y.o. PCP: Pcp, No  Date of Admission: 12/18/2023  7:53 AM Date of Discharge: 12/20/2023 9:41 AM Attending Physician: Dr. Antony Contras  Discharge Diagnosis: Principal Problem:   COPD exacerbation (HCC) Active Problems:   Moderate persistent asthma with acute exacerbation   Former smoker   Influenza A    Discharge Medications: Allergies as of 12/20/2023   No Known Allergies      Medication List     TAKE these medications    albuterol (2.5 MG/3ML) 0.083% nebulizer solution Commonly known as: PROVENTIL Take 3 mLs (2.5 mg total) by nebulization every 6 (six) hours as needed for wheezing or shortness of breath.   albuterol 108 (90 Base) MCG/ACT inhaler Commonly known as: VENTOLIN HFA INHALE 2 PUFFS BY MOUTH EVERY 4 HOURS AS NEEDED FOR WHEEZE OR FOR SHORTNESS OF BREATH   EPINEPHrine 0.3 mg/0.3 mL Soaj injection Commonly known as: EPI-PEN Inject 0.3 mg into the muscle as needed for anaphylaxis.   glucosamine-chondroitin 500-400 MG tablet Take 1 tablet by mouth 3 (three) times daily.   levocetirizine 5 MG tablet Commonly known as: XYZAL Take 1 tablet (5 mg total) by mouth every evening.   montelukast 10 MG tablet Commonly known as: SINGULAIR TAKE 1 TABLET BY MOUTH EVERYDAY AT BEDTIME   multivitamin with minerals Tabs tablet Take 1 tablet by mouth daily.   oseltamivir 75 MG capsule Commonly known as: TAMIFLU Take 1 capsule (75 mg total) by mouth 2 (two) times daily.   pantoprazole 40 MG tablet Commonly known as: PROTONIX TAKE 1 TABLET BY MOUTH EVERY DAY   predniSONE 20 MG tablet Commonly known as: DELTASONE Take 2 tablets (40 mg total) by mouth daily with breakfast.   Trelegy Ellipta 200-62.5-25 MCG/ACT Aepb Generic drug: Fluticasone-Umeclidin-Vilant INHALE 1 PUFF BY MOUTH EVERY DAY        Disposition and follow-up:   Mr.Bracen W Ng was discharged from [facility] in  Stable condition.  At the hospital follow up visit please address:  1.  Follow-up:  *COPD -Ensure he finishes the course of prednisone and Tamiflu -Assess symptoms in the clinic  -Encourage pulmonology follow up  -Assess tobacco cessation  -Check on inhaler supply/ refill if indicated    2.  Labs / imaging needed at time of follow-up: N/A  3.  Pending labs/ test needing follow-up: N/A  4.  Medication Changes  STOPPED  -N/A   ADDED  -Tamiflu  -Prednisone    MODIFIED  -N/A   Hospital Course by problem list: COPD exacerbation 2/2 Influenza A Patient presented to the emergency department hypoxic requiring supplemental oxygen via nasal cannula. His COPD exacerbation is due to an acute infection with influenza A. He was provided breathing treatments, prednisone, tamiflu, and azithromycin. His ambulatory pulse oximetry was 95% on room air.     GERD Continued home  Protonix 40mg  daily    Discharge Subjective: Patient is feeling better today. He is ready for discharge. He walked in the hallway without difficulty. The chest "tightness" has improved as well.   Discharge Exam:   Blood pressure (!) 120/93, pulse 82, temperature 98.3 F (36.8 C), resp. rate 18, height 5\' 6"  (1.676 m), weight 81 kg, SpO2 92%.  Constitutional:Well-appearing, sitting in the chair , in no acute distress Cardiovascular: regular rate and rhythm, no m/r/g Pulmonary/Chest: normal work of breathing on room air, mild diffuse expiratory wheezes, air flow has improved today (no longer decreased)  Abdominal: soft Neurological:  alert & awake, participating in conversation  Skin: warm and dry Psych: Normal mood and pleasant affect  Pertinent Labs, Studies, and Procedures:     Latest Ref Rng & Units 12/19/2023    4:21 AM 12/18/2023    8:04 AM 06/29/2023    3:44 PM  CBC  WBC 4.0 - 10.5 K/uL 6.1  6.6  8.9   Hemoglobin 13.0 - 17.0 g/dL 16.1  09.6  04.5   Hematocrit 39.0 - 52.0 % 39.4  44.8  43.4   Platelets  150 - 400 K/uL 209  214  338.0        Latest Ref Rng & Units 12/19/2023    4:21 AM 12/18/2023    8:04 AM 04/07/2022    9:24 AM  CMP  Glucose 70 - 99 mg/dL 409  811  98   BUN 6 - 20 mg/dL 14  19  22    Creatinine 0.61 - 1.24 mg/dL 9.14  7.82  9.56   Sodium 135 - 145 mmol/L 140  138  140   Potassium 3.5 - 5.1 mmol/L 3.8  4.0  4.6   Chloride 98 - 111 mmol/L 106  105  103   CO2 22 - 32 mmol/L 26  23  23    Calcium 8.9 - 10.3 mg/dL 8.9  9.1  9.2   Total Protein 6.5 - 8.1 g/dL 6.0  6.7  6.5   Total Bilirubin 0.0 - 1.2 mg/dL 0.5  0.6  0.2   Alkaline Phos 38 - 126 U/L 44  55  62   AST 15 - 41 U/L 32  43  23   ALT 0 - 44 U/L 43  50  29     DG Chest 1 View Result Date: 12/18/2023 CLINICAL DATA:  Shortness of breath and fever EXAM: CHEST  1 VIEW COMPARISON:  Chest radiograph dated 01/21/2020 FINDINGS: Normal lung volumes. No focal consolidations. No pleural effusion or pneumothorax. The heart size and mediastinal contours are within normal limits. No acute osseous abnormality. IMPRESSION: No focal consolidations. Electronically Signed   By: Agustin Cree M.D.   On: 12/18/2023 08:38     Discharge Instructions: Discharge Instructions     Call MD for:  difficulty breathing, headache or visual disturbances   Complete by: As directed    Call MD for:  extreme fatigue   Complete by: As directed    Call MD for:  hives   Complete by: As directed    Call MD for:  persistant dizziness or light-headedness   Complete by: As directed    Call MD for:  persistant nausea and vomiting   Complete by: As directed    Call MD for:  redness, tenderness, or signs of infection (pain, swelling, redness, odor or green/yellow discharge around incision site)   Complete by: As directed    Call MD for:  severe uncontrolled pain   Complete by: As directed    Call MD for:  temperature >100.4   Complete by: As directed    Diet - low sodium heart healthy   Complete by: As directed    Discharge instructions   Complete by:  As directed    You came to the hospital for difficulty breathing and you were diagnosed with a COPD exacerbation.  We treated you with Tamiflu and breathing treatments    *For your COPD -We have started you on these following medications:  -Prednisone 40mg  daily, continue this until 2/28  -Tamiflu, take one tablet every morning and  one tablet every evening until 2/28  -Please see your new PCP on 12/25/2023 at 1:45pm    Follow-up appointments: Please visit your family doctor in 7 to 10 days  If you have any questions or concerns please feel free to call: Internal medicine clinic at [phone]   If you have any of these following symptoms, please call us or seek care at an emergency department: -Chest Pain -Difficulty Breathing -Syncope (passing out) -Drooping of face -Slurred speech -Sudden weakness in your leg or arm -Fever -Chills   We are glad that you are feeling better, it was a pleasure to care for you!  Faith Rogue DO   Increase activity slowly   Complete by: As directed        Signed: Faith Rogue DO [facility] - PGY1 Pager: [phone] 12/20/2023, 9:41 AM    Please contact the on call pager after 5 pm and on weekends at [phone].

## 2023-12-20 NOTE — Progress Notes (Signed)
 Mobility Specialist Progress Note:   12/20/23 0857  Oxygen Therapy  O2 Device Room Air  Mobility  Activity Ambulated independently in hallway  Level of Assistance Modified independent, requires aide device or extra time  Assistive Device None  Distance Ambulated (ft) 150 ft  Activity Response Tolerated well  Mobility Referral Yes  Mobility visit 1 Mobility  Mobility Specialist Start Time (ACUTE ONLY) [id number]  Mobility Specialist Stop Time (ACUTE ONLY) 0831  Mobility Specialist Time Calculation (min) (ACUTE ONLY) 9 min    Pre Mobility: 110 HR,  95% SpO2 During Mobility: 115 HR,  96% SpO2 Post Mobility:  90 HR,  94% SpO2  Received pt in chair having no complaints and agreeable to mobility. Pt was asymptomatic throughout ambulation and returned to room w/o fault. Left ambulating in room w/ call bell in reach and all needs met.   D'Vante Earlene Plater Mobility Specialist Please contact via Special educational needs teacher or Rehab office at [phone]

## 2023-12-20 NOTE — Progress Notes (Signed)
 Patient is being discharge this shift, Nurse gave discharge instructions and paper work to patient, No acute distress noted. Patient going discharge lounge and will wait until family arrives, Patient able to walk independently with supervision.

## 2023-12-20 NOTE — TOC Transition Note (Signed)
 Transition of Care [facility]) - Discharge Note   Patient Details  Name: WILMAR PRABHAKAR MRN: [id number] Date of Birth: Apr 09, 1971  [facility] CM/SW Contact:  Tom-Johnson, Hershal Coria, RN Phone Number: 12/20/2023, 10:50 AM   Clinical Narrative:     Patient is scheduled for discharge today.  Outpatient f/u, hospital f/u and discharge instructions on AVS. Prescriptions sent to [facility] pharmacy and patient will receive meds prior discharge. No TOC needs or recommendations noted. Significant other, Lurena Joiner to transport at discharge.  No further TOC needs noted.              Patient Goals and CMS Choice            Discharge Placement                       Discharge Plan and Services Additional resources added to the After Visit Summary for                                       Social Drivers of Health (SDOH) Interventions SDOH Screenings   Food Insecurity: No Food Insecurity (12/18/2023)  Housing: Low Risk  (12/18/2023)  Transportation Needs: No Transportation Needs (12/18/2023)  Utilities: Not At Risk (12/18/2023)  Tobacco Use: Medium Risk (12/18/2023)     Readmission Risk Interventions     No data to display

## 2023-12-25 ENCOUNTER — Ambulatory Visit: Payer: BC Managed Care – PPO | Admitting: Student

## 2023-12-25 ENCOUNTER — Ambulatory Visit: Payer: Self-pay | Admitting: *Deleted

## 2023-12-25 VITALS — BP 131/80 | HR 83 | Temp 97.6°F | Ht 66.0 in | Wt 171.5 lb

## 2023-12-25 DIAGNOSIS — Z0000 Encounter for general adult medical examination without abnormal findings: Secondary | ICD-10-CM

## 2023-12-25 DIAGNOSIS — J441 Chronic obstructive pulmonary disease with (acute) exacerbation: Secondary | ICD-10-CM | POA: Diagnosis not present

## 2023-12-25 DIAGNOSIS — Z87891 Personal history of nicotine dependence: Secondary | ICD-10-CM

## 2023-12-25 DIAGNOSIS — F17211 Nicotine dependence, cigarettes, in remission: Secondary | ICD-10-CM

## 2023-12-25 DIAGNOSIS — Z23 Encounter for immunization: Secondary | ICD-10-CM | POA: Diagnosis not present

## 2023-12-25 DIAGNOSIS — Z1211 Encounter for screening for malignant neoplasm of colon: Secondary | ICD-10-CM

## 2023-12-25 DIAGNOSIS — Z122 Encounter for screening for malignant neoplasm of respiratory organs: Secondary | ICD-10-CM

## 2023-12-25 NOTE — Assessment & Plan Note (Signed)
 Patient is due for an influenza and PCV 20 vaccine.  He is also due for colonoscopy.  Patient received PCV 20 vaccination today.  Referral for colonoscopy sent.

## 2023-12-25 NOTE — Progress Notes (Signed)
 CC:  Chief Complaint  Patient presents with   Hospitalization Follow-up    Up    HPI:  [name] is a 53 y.o. male living with a history stated below and presents today for HFU. Please see problem based assessment and plan for additional details.  Past Medical History:  Diagnosis Date   ADHD (attention deficit hyperactivity disorder)    Asthma    COPD (chronic obstructive pulmonary disease) (HCC)    GERD (gastroesophageal reflux disease)    Heart murmur    as a child     Current Outpatient Medications on File Prior to Visit  Medication Sig Dispense Refill   albuterol (PROVENTIL) (2.5 MG/3ML) 0.083% nebulizer solution Take 3 mLs (2.5 mg total) by nebulization every 6 (six) hours as needed for wheezing or shortness of breath. 75 mL 5   albuterol (VENTOLIN HFA) 108 (90 Base) MCG/ACT inhaler INHALE 2 PUFFS BY MOUTH EVERY 4 HOURS AS NEEDED FOR WHEEZE OR FOR SHORTNESS OF BREATH 8.5 each 2   EPINEPHrine 0.3 mg/0.3 mL IJ SOAJ injection Inject 0.3 mg into the muscle as needed for anaphylaxis. 1 each 1   glucosamine-chondroitin 500-400 MG tablet Take 1 tablet by mouth 3 (three) times daily.     levocetirizine (XYZAL) 5 MG tablet Take 1 tablet (5 mg total) by mouth every evening. 30 tablet 5   montelukast (SINGULAIR) 10 MG tablet TAKE 1 TABLET BY MOUTH EVERYDAY AT BEDTIME 90 tablet 1   Multiple Vitamin (MULTIVITAMIN WITH MINERALS) TABS tablet Take 1 tablet by mouth daily.     pantoprazole (PROTONIX) 40 MG tablet TAKE 1 TABLET BY MOUTH EVERY DAY 90 tablet 1   TRELEGY ELLIPTA 200-62.5-25 MCG/ACT AEPB INHALE 1 PUFF BY MOUTH EVERY DAY 180 each 1   Current Facility-Administered Medications on File Prior to Visit  Medication Dose Route Frequency Provider Last Rate Last Admin   EPINEPHrine (EPI-PEN) injection 0.3 mg  0.3 mg Intramuscular Once Marcelyn Bruins, MD        Family History  Problem Relation Age of Onset   COPD Mother    Allergic rhinitis Neg Hx     Angioedema Neg Hx    Asthma Neg Hx    Atopy Neg Hx    Eczema Neg Hx    Immunodeficiency Neg Hx    Urticaria Neg Hx     Social History   Socioeconomic History   Marital status: Single    Spouse name: Not on file   Number of children: 1   Years of education: 12   Highest education level: Not on file  Occupational History   Not on file  Tobacco Use   Smoking status: Former    Current packs/day: 0.00    Average packs/day: 1 pack/day for 37.0 years (37.0 ttl pk-yrs)    Types: Cigarettes    Start date: 72    Quit date: 2017    Years since quitting: 8.1    Passive exposure: Past   Smokeless tobacco: Never  Vaping Use   Vaping status: Never Used  Substance and Sexual Activity   Alcohol use: No   Drug use: No   Sexual activity: Not on file  Other Topics Concern   Not on file  Social History Narrative   Not on file   Social Drivers of Health   Financial Resource Strain: Not on file  Food Insecurity: No Food Insecurity (12/18/2023)   Hunger Vital Sign    Worried About Running Out of Food in  the Last Year: Never true    Ran Out of Food in the Last Year: Never true  Transportation Needs: No Transportation Needs (12/18/2023)   PRAPARE - Administrator, Civil Service (Medical): No    Lack of Transportation (Non-Medical): No  Physical Activity: Not on file  Stress: Not on file  Social Connections: Not on file  Intimate Partner Violence: Not At Risk (12/18/2023)   Humiliation, Afraid, Rape, and Kick questionnaire    Fear of Current or Ex-Partner: No    Emotionally Abused: No    Physically Abused: No    Sexually Abused: No    Review of Systems: ROS negative except for what is noted on the assessment and plan.  Vitals:   12/25/23 1352  BP: 131/80  Pulse: 83  Temp: 97.6 F (36.4 C)  TempSrc: Oral  SpO2: 99%  Weight: 171 lb 8 oz (77.8 kg)  Height: 5\' 6"  (1.676 m)    Physical Exam: Constitutional: well-appearing in NAD HENT: normocephalic atraumatic,  mucous membranes moist Eyes: conjunctiva non-erythematous Cardiovascular: regular rate and rhythm, no m/r/g, no BLE edema Pulmonary/Chest: normal work of breathing on room air, wheezing and crackles noted on the mid and lower lung fields. Abdominal: soft, non-tender, non-distended MSK: normal bulk and tone Neurological: alert & oriented x 3, no focal deficit Skin: warm and dry Psych: normal mood and behavior  Assessment & Plan:   Patient discussed with Dr. Lafonda Mosses  COPD exacerbation [facility]) Patient coming in for a hospital follow-up after his hospitalization for acute on chronic respiratory failure for 2 days on 24 February of this year.  He does not use any oxygen at baseline but required oxygen during his hospitalization and tested positive for influenza A.  He is not currently vaccinated for influenza.  He is a former smoker.  He had restarted smoking shortly before he developed his COPD exacerbation.  He was discharged on Trelegy and albuterol as needed.  He states he has been using albuterol about once a day.  Denies any fevers and is able to cough up sputum that has not changed in color or consistency at this time.  He is saturating well on room air.  He does get short of breath when he goes to work because he has to go through a lot of stairs and across the street to go to work.  He has a history of COPD asthma overlap and sees an allergist as well as follows Dr. Isaiah Serge from pulmonology.  He last saw Dr. Isaiah Serge December last year and obtained a low-dose CT scan because he has a history of a nodule in his lung.  He does not have any clubbing denies any night sweats and unintentional weight loss.  He would be due for a follow-up low-dose CT scan in June.   He is wheezing at this time and would benefit from using albuterol more if he gets short of breath. He is to continue using Trelegy daily. Continue using albuterol every 6 hours as needed. Continue using montelukast daily.   Continue using  his oral antihistamine. Follow-up with Dr. Isaiah Serge and allergist. Order for low-dose CT scan placed to be done in June.  Healthcare maintenance Patient is due for an influenza and PCV 20 vaccine.  He is also due for colonoscopy.  Patient received PCV 20 vaccination today.  Referral for colonoscopy sent.  Former smoker Smoking cessation counseling provided to the patient the patient actually stopped smoking after his hospitalization.  He does not  crave his cigarettes at this time and states he does not need nicotine replacement therapy at this time.  Manuela Neptune, MD [facility] Internal Medicine, PGY-1 Phone: [phone] Date 12/25/2023 Time 3:51 PM

## 2023-12-25 NOTE — Assessment & Plan Note (Signed)
 Smoking cessation counseling provided to the patient the patient actually stopped smoking after his hospitalization.  He does not crave his cigarettes at this time and states he does not need nicotine replacement therapy at this time.

## 2023-12-25 NOTE — Assessment & Plan Note (Signed)
 Patient coming in for a hospital follow-up after his hospitalization for acute on chronic respiratory failure for 2 days on 24 February of this year.  He does not use any oxygen at baseline but required oxygen during his hospitalization and tested positive for influenza A.  He is not currently vaccinated for influenza.  He is a former smoker.  He had restarted smoking shortly before he developed his COPD exacerbation.  He was discharged on Trelegy and albuterol as needed.  He states he has been using albuterol about once a day.  Denies any fevers and is able to cough up sputum that has not changed in color or consistency at this time.  He is saturating well on room air.  He does get short of breath when he goes to work because he has to go through a lot of stairs and across the street to go to work.  He has a history of COPD asthma overlap and sees an allergist as well as follows Dr. Isaiah Serge from pulmonology.  He last saw Dr. Isaiah Serge December last year and obtained a low-dose CT scan because he has a history of a nodule in his lung.  He does not have any clubbing denies any night sweats and unintentional weight loss.  He would be due for a follow-up low-dose CT scan in June.   He is wheezing at this time and would benefit from using albuterol more if he gets short of breath. He is to continue using Trelegy daily. Continue using albuterol every 6 hours as needed. Continue using montelukast daily.   Continue using his oral antihistamine. Follow-up with Dr. Isaiah Serge and allergist. Order for low-dose CT scan placed to be done in June.

## 2023-12-25 NOTE — Patient Instructions (Signed)
 Thank you, [name] for allowing [place] to provide your care today.   Referrals ordered today:    Referral Orders         Ambulatory referral to Gastroenterology      I have ordered the following medication/changed the following medications:   Stop the following medications: Medications Discontinued During This Encounter  Medication Reason   predniSONE (DELTASONE) 20 MG tablet    oseltamivir (TAMIFLU) 75 MG capsule    methylPREDNISolone acetate (DEPO-MEDROL) injection 80 mg      Start the following medications: No orders of the defined types were placed in this encounter.    Follow up:  6 months    Remember:   Continue using the Trelegy. You can use your albuterol every 6 hours as needed.   Continue taking your antihistamines. Follow up with Pulm and Allergy.   Should you have any questions or concerns please call the internal medicine clinic at [phone].     Manuela Neptune, MD [facility]

## 2023-12-27 ENCOUNTER — Ambulatory Visit (INDEPENDENT_AMBULATORY_CARE_PROVIDER_SITE_OTHER): Payer: Self-pay | Admitting: *Deleted

## 2023-12-27 DIAGNOSIS — J309 Allergic rhinitis, unspecified: Secondary | ICD-10-CM

## 2023-12-28 NOTE — Progress Notes (Signed)
 Internal Medicine Clinic Attending  Case discussed with the resident at the time of the visit.  We reviewed the resident's history and exam and pertinent patient test results.  I agree with the assessment, diagnosis, and plan of care documented in the resident's note.

## 2024-01-03 ENCOUNTER — Ambulatory Visit (INDEPENDENT_AMBULATORY_CARE_PROVIDER_SITE_OTHER): Payer: Self-pay

## 2024-01-03 DIAGNOSIS — J309 Allergic rhinitis, unspecified: Secondary | ICD-10-CM

## 2024-01-10 ENCOUNTER — Ambulatory Visit (INDEPENDENT_AMBULATORY_CARE_PROVIDER_SITE_OTHER): Payer: Self-pay | Admitting: *Deleted

## 2024-01-10 DIAGNOSIS — J309 Allergic rhinitis, unspecified: Secondary | ICD-10-CM | POA: Diagnosis not present

## 2024-01-17 ENCOUNTER — Ambulatory Visit (INDEPENDENT_AMBULATORY_CARE_PROVIDER_SITE_OTHER): Payer: Self-pay | Admitting: *Deleted

## 2024-01-17 DIAGNOSIS — J309 Allergic rhinitis, unspecified: Secondary | ICD-10-CM

## 2024-01-18 ENCOUNTER — Encounter: Payer: Self-pay | Admitting: Gastroenterology

## 2024-01-24 ENCOUNTER — Ambulatory Visit (INDEPENDENT_AMBULATORY_CARE_PROVIDER_SITE_OTHER): Payer: Self-pay | Admitting: *Deleted

## 2024-01-24 DIAGNOSIS — J309 Allergic rhinitis, unspecified: Secondary | ICD-10-CM

## 2024-02-05 ENCOUNTER — Ambulatory Visit (AMBULATORY_SURGERY_CENTER)

## 2024-02-05 VITALS — Ht 66.0 in | Wt 170.0 lb

## 2024-02-05 DIAGNOSIS — Z1211 Encounter for screening for malignant neoplasm of colon: Secondary | ICD-10-CM

## 2024-02-05 MED ORDER — NA SULFATE-K SULFATE-MG SULF 17.5-3.13-1.6 GM/177ML PO SOLN: 1.0000 | Freq: Once | ORAL | 0 refills | Status: AC

## 2024-02-05 NOTE — Progress Notes (Signed)

## 2024-02-06 ENCOUNTER — Other Ambulatory Visit: Payer: Self-pay | Admitting: Pulmonary Disease

## 2024-02-06 DIAGNOSIS — J441 Chronic obstructive pulmonary disease with (acute) exacerbation: Secondary | ICD-10-CM

## 2024-02-21 ENCOUNTER — Ambulatory Visit (INDEPENDENT_AMBULATORY_CARE_PROVIDER_SITE_OTHER)

## 2024-02-21 ENCOUNTER — Encounter: Payer: Self-pay | Admitting: Gastroenterology

## 2024-02-21 DIAGNOSIS — J309 Allergic rhinitis, unspecified: Secondary | ICD-10-CM

## 2024-03-04 ENCOUNTER — Encounter: Payer: Self-pay | Admitting: Gastroenterology

## 2024-03-04 ENCOUNTER — Ambulatory Visit (AMBULATORY_SURGERY_CENTER): Admitting: Gastroenterology

## 2024-03-04 VITALS — BP 124/81 | HR 71 | Temp 97.3°F | Resp 19 | Ht 66.0 in | Wt 170.0 lb

## 2024-03-04 DIAGNOSIS — D128 Benign neoplasm of rectum: Secondary | ICD-10-CM

## 2024-03-04 DIAGNOSIS — Z1211 Encounter for screening for malignant neoplasm of colon: Secondary | ICD-10-CM

## 2024-03-04 DIAGNOSIS — K635 Polyp of colon: Secondary | ICD-10-CM | POA: Diagnosis not present

## 2024-03-04 DIAGNOSIS — K621 Rectal polyp: Secondary | ICD-10-CM | POA: Diagnosis not present

## 2024-03-04 DIAGNOSIS — D125 Benign neoplasm of sigmoid colon: Secondary | ICD-10-CM

## 2024-03-04 MED ORDER — SODIUM CHLORIDE 0.9 % IV SOLN: 500.0000 mL | INTRAVENOUS | Status: DC

## 2024-03-04 NOTE — Op Note (Signed)
 [facility] Patient Name: [name] Procedure Date: 03/04/2024 9:21 AM MRN: [id number] Endoscopist: Geralyn Knee E. Cherryl Corona , MD, [id number] Age: 53 Referring MD:  Date of Birth: 1971/04/21 Gender: Male Account #: [id number] Procedure:                Colonoscopy Indications:              Screening for colorectal malignant neoplasm, This                            is the patient's first colonoscopy Medicines:                Monitored Anesthesia Care Procedure:                Pre-Anesthesia Assessment:                           - Prior to the procedure, a History and Physical                            was performed, and patient medications and                            allergies were reviewed. The patient's tolerance of                            previous anesthesia was also reviewed. The risks                            and benefits of the procedure and the sedation                            options and risks were discussed with the patient.                            All questions were answered, and informed consent                            was obtained. Prior Anticoagulants: The patient has                            taken no anticoagulant or antiplatelet agents. ASA                            Grade Assessment: II - A patient with mild systemic                            disease. After reviewing the risks and benefits,                            the patient was deemed in satisfactory condition to                            undergo the procedure.  After obtaining informed consent, the colonoscope                            was passed under direct vision. Throughout the                            procedure, the patient's blood pressure, pulse, and                            oxygen saturations were monitored continuously. The                            Olympus Scope SN [id number] was introduced through the                            anus and  advanced to the the terminal ileum, with                            identification of the appendiceal orifice and IC                            valve. The colonoscopy was performed without                            difficulty. The patient tolerated the procedure                            well. The quality of the bowel preparation was                            good. The terminal ileum, ileocecal valve,                            appendiceal orifice, and rectum were photographed.                            The bowel preparation used was SUPREP via split                            dose instruction. Scope In: 9:37:23 AM Scope Out: 9:52:32 AM Scope Withdrawal Time: 0 hours 12 minutes 24 seconds  Total Procedure Duration: 0 hours 15 minutes 9 seconds  Findings:                 The perianal and digital rectal examinations were                            normal. Pertinent negatives include normal                            sphincter tone and no palpable rectal lesions.                           A 4 mm polyp was found in the sigmoid colon. The  polyp was sessile. The polyp was removed with a                            cold snare. Resection and retrieval were complete.                            Estimated blood loss was minimal.                           A 4 mm polyp was found in the rectum. The polyp was                            sessile. The polyp was removed with a cold snare.                            Resection and retrieval were complete. Estimated                            blood loss was minimal.                           Multiple hyperplastic polyps were found in the                            rectum. The polyps were diminutive in size.                           The exam was otherwise normal throughout the                            examined colon.                           The terminal ileum appeared normal.                           The retroflexed view of  the distal rectum and anal                            verge was normal and showed no anal or rectal                            abnormalities. Complications:            No immediate complications. Estimated Blood Loss:     Estimated blood loss was minimal. Impression:               - One 4 mm polyp in the sigmoid colon, removed with                            a cold snare. Resected and retrieved.                           - One 4 mm polyp in the rectum, removed with a cold  snare. Resected and retrieved.                           - Multiple diminutive polyps in the rectum,                            consistent with hyperplastic polyps.                           - The examined portion of the ileum was normal.                           - The distal rectum and anal verge are normal on                            retroflexion view. Recommendation:           - Patient has a contact number available for                            emergencies. The signs and symptoms of potential                            delayed complications were discussed with the                            patient. Return to normal activities tomorrow.                            Written discharge instructions were provided to the                            patient.                           - Resume previous diet.                           - Continue present medications.                           - Await pathology results.                           - Repeat colonoscopy (date not yet determined) for                            surveillance based on pathology results. Rien Marland E. Cherryl Corona, MD 03/04/2024 10:00:07 AM This report has been signed electronically.

## 2024-03-04 NOTE — Progress Notes (Signed)
 Pt resting comfortably. VSS. Airway intact. SBAR complete to RN. All questions answered.

## 2024-03-04 NOTE — Progress Notes (Signed)
 Called to room to assist during endoscopic procedure.  Patient ID and intended procedure confirmed with present staff. Received instructions for my participation in the procedure from the performing physician.

## 2024-03-04 NOTE — Patient Instructions (Signed)
Resume previous diet and medications. Awaiting pathology results. Repeat Colonoscopy date to be determined based on pathology results. Handout provided on Colon Polyps.  YOU HAD AN ENDOSCOPIC PROCEDURE TODAY AT THE [facility]:   Refer to the procedure report that was given to you for any specific questions about what was found during the examination.  If the procedure report does not answer your questions, please call your gastroenterologist to clarify.  If you requested that your care partner not be given the details of your procedure findings, then the procedure report has been included in a sealed envelope for you to review at your convenience later.  YOU SHOULD EXPECT: Some feelings of bloating in the abdomen. Passage of more gas than usual.  Walking can help get rid of the air that was put into your GI tract during the procedure and reduce the bloating. If you had a lower endoscopy (such as a colonoscopy or flexible sigmoidoscopy) you may notice spotting of blood in your stool or on the toilet paper. If you underwent a bowel prep for your procedure, you may not have a normal bowel movement for a few days.  Please Note:  You might notice some irritation and congestion in your nose or some drainage.  This is from the oxygen used during your procedure.  There is no need for concern and it should clear up in a day or so.  SYMPTOMS TO REPORT IMMEDIATELY:  Following lower endoscopy (colonoscopy or flexible sigmoidoscopy):  Excessive amounts of blood in the stool  Significant tenderness or worsening of abdominal pains  Swelling of the abdomen that is new, acute  Fever of 100F or higher   For urgent or emergent issues, a gastroenterologist can be reached at any hour by calling [phone]. Do not use MyChart messaging for urgent concerns.    DIET:  We do recommend a small meal at first, but then you may proceed to your regular diet.  Drink plenty of fluids but you should avoid  alcoholic beverages for 24 hours.  ACTIVITY:  You should plan to take it easy for the rest of today and you should NOT DRIVE or use heavy machinery until tomorrow (because of the sedation medicines used during the test).    FOLLOW UP: Our staff will call the number listed on your records the next business day following your procedure.  We will call around 7:15- 8:00 am to check on you and address any questions or concerns that you may have regarding the information given to you following your procedure. If we do not reach you, we will leave a message.     If any biopsies were taken you will be contacted by phone or by letter within the next 1-3 weeks.  Please call us at [phone] if you have not heard about the biopsies in 3 weeks.    SIGNATURES/CONFIDENTIALITY: You and/or your care partner have signed paperwork which will be entered into your electronic medical record.  These signatures attest to the fact that that the information above on your After Visit Summary has been reviewed and is understood.  Full responsibility of the confidentiality of this discharge information lies with you and/or your care-partner. 

## 2024-03-04 NOTE — Progress Notes (Signed)
 [facility] History and Physical   Primary Care Physician:  Pcp, No   Reason for Procedure:   Colon cancer screening  Plan:    Screening colonoscopy     HPI: [name] is a 53 y.o. male undergoing initial average risk screening colonoscopy.  He has no family history of colon cancer and no chronic GI symptoms.    Past Medical History:  Diagnosis Date   ADHD (attention deficit hyperactivity disorder)    Asthma    COPD (chronic obstructive pulmonary disease) (HCC)    GERD (gastroesophageal reflux disease)    Heart murmur    as a child     Past Surgical History:  Procedure Laterality Date   cervical fracture repair     SHOULDER ARTHROSCOPY WITH ROTATOR CUFF REPAIR AND SUBACROMIAL DECOMPRESSION Right 02/14/2020   Procedure: SHOULDER ARTHROSCOPY WITH MINI OPEN ROTATOR CUFF REPAIR AND SUBACROMIAL DECOMPRESSION AND DEBRIDEMENT;  Surgeon: Orvan Blanch, MD;  Location: [facility];  Service: Orthopedics;  Laterality: Right;  90 MINS BLOCK OK- NO EXPAREL     Prior to Admission medications   Medication Sig Start Date End Date Taking? Authorizing Provider  albuterol  (VENTOLIN  HFA) 108 (90 Base) MCG/ACT inhaler INHALE 2 PUFFS BY MOUTH EVERY 4 HOURS AS NEEDED FOR WHEEZE OR FOR SHORTNESS OF BREATH 07/10/23  Yes Cobb, Mariah Shines, NP  [facility] OLOPATADINE  HCL 0.2 % SOLN SMARTSIG:1 Drop(s) In Eye(s) Daily PRN 01/22/24  Yes [provider]  glucosamine-chondroitin 500-400 MG tablet Take 2 tablets by mouth at bedtime.   Yes [provider]  levocetirizine (XYZAL ) 5 MG tablet Take 1 tablet (5 mg total) by mouth every evening. Patient taking differently: Take 5 mg by mouth as needed. 02/02/23  Yes Sean Czar, MD  montelukast  (SINGULAIR ) 10 MG tablet TAKE 1 TABLET BY MOUTH EVERYDAY AT BEDTIME 10/03/23  Yes Mannam, Praveen, MD  Multiple Vitamin (MULTIVITAMIN WITH MINERALS) TABS tablet Take 1 tablet by mouth daily.   Yes [provider]  pantoprazole   (PROTONIX ) 40 MG tablet TAKE 1 TABLET BY MOUTH EVERY DAY 11/10/23  Yes Cobb, Mariah Shines, NP  TRELEGY ELLIPTA  200-62.5-25 MCG/ACT AEPB INHALE 1 PUFF BY MOUTH EVERY DAY 02/06/24  Yes Mannam, Praveen, MD  albuterol  (PROVENTIL ) (2.5 MG/3ML) 0.083% nebulizer solution Take 3 mLs (2.5 mg total) by nebulization every 6 (six) hours as needed for wheezing or shortness of breath. 06/29/23   Cobb, Mariah Shines, NP  EPINEPHrine  0.3 mg/0.3 mL IJ SOAJ injection Inject 0.3 mg into the muscle as needed for anaphylaxis. 03/21/23   Orelia Binet, MD    Current Outpatient Medications  Medication Sig Dispense Refill   albuterol  (VENTOLIN  HFA) 108 (90 Base) MCG/ACT inhaler INHALE 2 PUFFS BY MOUTH EVERY 4 HOURS AS NEEDED FOR WHEEZE OR FOR SHORTNESS OF BREATH 8.5 each 2   [facility] OLOPATADINE  HCL 0.2 % SOLN SMARTSIG:1 Drop(s) In Eye(s) Daily PRN     glucosamine-chondroitin 500-400 MG tablet Take 2 tablets by mouth at bedtime.     levocetirizine (XYZAL ) 5 MG tablet Take 1 tablet (5 mg total) by mouth every evening. (Patient taking differently: Take 5 mg by mouth as needed.) 30 tablet 5   montelukast  (SINGULAIR ) 10 MG tablet TAKE 1 TABLET BY MOUTH EVERYDAY AT BEDTIME 90 tablet 1   Multiple Vitamin (MULTIVITAMIN WITH MINERALS) TABS tablet Take 1 tablet by mouth daily.     pantoprazole  (PROTONIX ) 40 MG tablet TAKE 1 TABLET BY MOUTH EVERY DAY 90 tablet 1   TRELEGY ELLIPTA  200-62.5-25 MCG/ACT AEPB  INHALE 1 PUFF BY MOUTH EVERY DAY 180 each 1   albuterol  (PROVENTIL ) (2.5 MG/3ML) 0.083% nebulizer solution Take 3 mLs (2.5 mg total) by nebulization every 6 (six) hours as needed for wheezing or shortness of breath. 75 mL 5   EPINEPHrine  0.3 mg/0.3 mL IJ SOAJ injection Inject 0.3 mg into the muscle as needed for anaphylaxis. 1 each 1   Current Facility-Administered Medications  Medication Dose Route Frequency Provider Last Rate Last Admin   0.9 %  sodium chloride  infusion  500 mL Intravenous Continuous Elois Hair, MD        EPINEPHrine  (EPI-PEN) injection 0.3 mg  0.3 mg Intramuscular Once Brian Campanile, MD        Allergies as of 03/04/2024 - Review Complete 03/04/2024  Allergen Reaction Noted   Grass pollen(k-o-r-t-swt vern) Anaphylaxis 12/25/2023    Family History  Problem Relation Age of Onset   COPD Mother    Allergic rhinitis Neg Hx    Angioedema Neg Hx    Asthma Neg Hx    Atopy Neg Hx    Eczema Neg Hx    Immunodeficiency Neg Hx    Urticaria Neg Hx    Colon cancer Neg Hx    Rectal cancer Neg Hx    Stomach cancer Neg Hx     Social History   Socioeconomic History   Marital status: Single    Spouse name: Not on file   Number of children: 1   Years of education: 12   Highest education level: Not on file  Occupational History   Not on file  Tobacco Use   Smoking status: Every Day    Current packs/day: 0.50    Average packs/day: 1 pack/day for 37.3 years (37.1 ttl pk-yrs)    Types: Cigarettes    Start date: 68    Last attempt to quit: 2017    Passive exposure: Past   Smokeless tobacco: Never  Vaping Use   Vaping status: Never Used  Substance and Sexual Activity   Alcohol use: No   Drug use: No   Sexual activity: Not on file  Other Topics Concern   Not on file  Social History Narrative   Not on file   Social Drivers of Health   Financial Resource Strain: Not on file  Food Insecurity: No Food Insecurity (12/18/2023)   Hunger Vital Sign    Worried About Running Out of Food in the Last Year: Never true    Ran Out of Food in the Last Year: Never true  Transportation Needs: No Transportation Needs (12/18/2023)   PRAPARE - Transportation    Lack of Transportation (Medical): No    Lack of Transportation (Non-Medical): No  Physical Activity: Not on file  Stress: Not on file  Social Connections: Not on file  Intimate Partner Violence: Not At Risk (12/18/2023)   Humiliation, Afraid, Rape, and Kick questionnaire    Fear of Current or Ex-Partner: No    Emotionally  Abused: No    Physically Abused: No    Sexually Abused: No    Review of Systems:  All other review of systems negative except as mentioned in the HPI.  Physical Exam: Vital signs BP 138/86   Pulse 78   Temp (!) 97.3 F (36.3 C)   Ht 5\' 6"  (1.676 m)   Wt 170 lb (77.1 kg)   SpO2 96%   BMI 27.44 kg/m   General:   Alert,  Well-developed, well-nourished, pleasant and cooperative in NAD Airway:  Mallampati 1 Lungs:  Clear throughout to auscultation.   Heart:  Regular rate and rhythm; no murmurs, clicks, rubs,  or gallops. Abdomen:  Soft, nontender and nondistended. Normal bowel sounds.   Neuro/Psych:  Normal mood and affect. A and O x 3   Hilmer Aliberti E. Cherryl Corona, MD [facility]

## 2024-03-04 NOTE — Progress Notes (Signed)
 Patient states there have been no changes to medical or surgical history since time of pre-visit.

## 2024-03-05 ENCOUNTER — Telehealth: Payer: Self-pay

## 2024-03-05 NOTE — Telephone Encounter (Signed)
  Follow up Call-     03/04/2024    8:45 AM  Call back number  Post procedure Call Back phone  # [phone]  Permission to leave phone message Yes     Patient questions:  Do you have a fever, pain , or abdominal swelling? No. Pain Score  0 *  Have you tolerated food without any problems? Yes.    Have you been able to return to your normal activities? Yes.    Do you have any questions about your discharge instructions: Diet   No. Medications  No. Follow up visit  No.  Do you have questions or concerns about your Care? No.  Actions: * If pain score is 4 or above: No action needed, pain <4.

## 2024-03-06 LAB — SURGICAL PATHOLOGY

## 2024-03-08 ENCOUNTER — Ambulatory Visit: Payer: Self-pay | Admitting: Gastroenterology

## 2024-03-08 NOTE — Progress Notes (Signed)
 Mr. Karau,  Good news: the polyps that I removed during your recent examination were NOT precancerous.  You should continue to follow current colorectal cancer screening guidelines with a repeat colonoscopy in 10 years.    If you develop any new rectal bleeding, abdominal pain or significant bowel habit changes, please contact me before then.

## 2024-03-10 ENCOUNTER — Other Ambulatory Visit: Payer: Self-pay

## 2024-03-10 ENCOUNTER — Encounter (HOSPITAL_BASED_OUTPATIENT_CLINIC_OR_DEPARTMENT_OTHER): Payer: Self-pay

## 2024-03-10 ENCOUNTER — Inpatient Hospital Stay (HOSPITAL_BASED_OUTPATIENT_CLINIC_OR_DEPARTMENT_OTHER)
Admission: EM | Admit: 2024-03-10 | Discharge: 2024-03-14 | DRG: 190 | Disposition: A | Discharge status: Home / Self Care | Attending: Family Medicine | Admitting: Family Medicine

## 2024-03-10 ENCOUNTER — Emergency Department (HOSPITAL_BASED_OUTPATIENT_CLINIC_OR_DEPARTMENT_OTHER): Admitting: Radiology

## 2024-03-10 DIAGNOSIS — F1011 Alcohol abuse, in remission: Secondary | ICD-10-CM | POA: Diagnosis present

## 2024-03-10 DIAGNOSIS — J441 Chronic obstructive pulmonary disease with (acute) exacerbation: Principal | ICD-10-CM | POA: Diagnosis present

## 2024-03-10 DIAGNOSIS — J9601 Acute respiratory failure with hypoxia: Secondary | ICD-10-CM | POA: Diagnosis not present

## 2024-03-10 DIAGNOSIS — R509 Fever, unspecified: Secondary | ICD-10-CM | POA: Diagnosis not present

## 2024-03-10 DIAGNOSIS — Z1152 Encounter for screening for COVID-19: Secondary | ICD-10-CM

## 2024-03-10 DIAGNOSIS — F17210 Nicotine dependence, cigarettes, uncomplicated: Secondary | ICD-10-CM | POA: Diagnosis not present

## 2024-03-10 DIAGNOSIS — R0602 Shortness of breath: Secondary | ICD-10-CM | POA: Diagnosis not present

## 2024-03-10 DIAGNOSIS — F909 Attention-deficit hyperactivity disorder, unspecified type: Secondary | ICD-10-CM | POA: Diagnosis present

## 2024-03-10 DIAGNOSIS — I5189 Other ill-defined heart diseases: Secondary | ICD-10-CM | POA: Diagnosis not present

## 2024-03-10 DIAGNOSIS — Z888 Allergy status to other drugs, medicaments and biological substances status: Secondary | ICD-10-CM

## 2024-03-10 DIAGNOSIS — K219 Gastro-esophageal reflux disease without esophagitis: Secondary | ICD-10-CM | POA: Diagnosis not present

## 2024-03-10 DIAGNOSIS — B9781 Human metapneumovirus as the cause of diseases classified elsewhere: Secondary | ICD-10-CM | POA: Diagnosis present

## 2024-03-10 DIAGNOSIS — I7090 Unspecified atherosclerosis: Secondary | ICD-10-CM | POA: Diagnosis present

## 2024-03-10 DIAGNOSIS — Z716 Tobacco abuse counseling: Secondary | ICD-10-CM

## 2024-03-10 DIAGNOSIS — Z825 Family history of asthma and other chronic lower respiratory diseases: Secondary | ICD-10-CM | POA: Diagnosis not present

## 2024-03-10 DIAGNOSIS — Z7951 Long term (current) use of inhaled steroids: Secondary | ICD-10-CM | POA: Diagnosis not present

## 2024-03-10 DIAGNOSIS — I700 Atherosclerosis of aorta: Secondary | ICD-10-CM | POA: Diagnosis not present

## 2024-03-10 DIAGNOSIS — Z79899 Other long term (current) drug therapy: Secondary | ICD-10-CM

## 2024-03-10 DIAGNOSIS — R059 Cough, unspecified: Secondary | ICD-10-CM | POA: Diagnosis not present

## 2024-03-10 LAB — RESP PANEL BY RT-PCR (RSV, FLU A&B, COVID)  RVPGX2
Influenza A by PCR: NEGATIVE
Influenza B by PCR: NEGATIVE
Resp Syncytial Virus by PCR: NEGATIVE
SARS Coronavirus 2 by RT PCR: NEGATIVE

## 2024-03-10 MED ORDER — ALBUTEROL SULFATE (2.5 MG/3ML) 0.083% IN NEBU
INHALATION_SOLUTION | RESPIRATORY_TRACT | Status: AC
  Administered 2024-03-10: 2.5 mg via RESPIRATORY_TRACT
  Filled 2024-03-10: qty 3

## 2024-03-10 MED ORDER — ALBUTEROL SULFATE (2.5 MG/3ML) 0.083% IN NEBU: 2.5000 mg | INHALATION_SOLUTION | RESPIRATORY_TRACT | Status: AC

## 2024-03-10 MED ORDER — IPRATROPIUM-ALBUTEROL 0.5-2.5 (3) MG/3ML IN SOLN: 3.0000 mL | RESPIRATORY_TRACT | Status: AC

## 2024-03-10 MED ORDER — AZITHROMYCIN 250 MG PO TABS
500.0000 mg | ORAL_TABLET | Freq: Once | ORAL | Status: AC
  Administered 2024-03-11: 500 mg via ORAL
  Filled 2024-03-10: qty 2

## 2024-03-10 MED ORDER — IPRATROPIUM-ALBUTEROL 0.5-2.5 (3) MG/3ML IN SOLN
RESPIRATORY_TRACT | Status: AC
  Administered 2024-03-10: 3 mL via RESPIRATORY_TRACT
  Filled 2024-03-10: qty 3

## 2024-03-10 MED ORDER — HYDROCOD POLI-CHLORPHE POLI ER 10-8 MG/5ML PO SUER
5.0000 mL | Freq: Once | ORAL | Status: AC
  Administered 2024-03-11: 5 mL via ORAL
  Filled 2024-03-10: qty 5

## 2024-03-10 MED ORDER — AZITHROMYCIN 250 MG PO TABS: 250.0000 mg | ORAL_TABLET | Freq: Every day | ORAL | 0 refills | Status: DC

## 2024-03-10 NOTE — ED Triage Notes (Signed)
 Patient arrives POV with complaints of intermittent fever x2 days (high of 103F) and cough.  Hx of COPD

## 2024-03-10 NOTE — ED Provider Notes (Addendum)
 [place] EMERGENCY DEPARTMENT AT [facility] Provider Note   CSN: [id number] Arrival date & time: 03/10/24  2014     History Chief Complaint  Patient presents with   Fever   Cough    HPI [name] is a 53 y.o. male presenting for fever cough congestion with SOB and nausea. HX of COPD. Some mucus production. Has been using tylenol  PRN this weekend.   Patient's recorded medical, surgical, social, medication list and allergies were reviewed in the Snapshot window as part of the initial history.   Review of Systems   Review of Systems  Constitutional:  Positive for fever. Negative for chills.  HENT:  Positive for congestion. Negative for ear pain and sore throat.   Eyes:  Negative for pain and visual disturbance.  Respiratory:  Positive for cough. Negative for shortness of breath.   Cardiovascular:  Negative for chest pain and palpitations.  Gastrointestinal:  Negative for abdominal pain and vomiting.  Genitourinary:  Negative for dysuria and hematuria.  Musculoskeletal:  Negative for arthralgias and back pain.  Skin:  Negative for color change and rash.  Neurological:  Negative for seizures and syncope.  All other systems reviewed and are negative.   Physical Exam Updated Vital Signs BP 131/83   Pulse 99   Temp 99 F (37.2 C) (Oral)   Resp 20   Ht 5\' 6"  (1.676 m)   Wt 77.1 kg   SpO2 93%   BMI 27.43 kg/m  Physical Exam Vitals and nursing note reviewed.  Constitutional:      General: He is not in acute distress.    Appearance: He is well-developed.  HENT:     Head: Normocephalic and atraumatic.  Eyes:     Conjunctiva/sclera: Conjunctivae normal.  Cardiovascular:     Rate and Rhythm: Normal rate and regular rhythm.     Heart sounds: No murmur heard. Pulmonary:     Effort: Pulmonary effort is normal. No respiratory distress.     Breath sounds: Wheezing present.  Abdominal:     Palpations: Abdomen is soft.     Tenderness: There is no  abdominal tenderness.  Musculoskeletal:        General: No swelling.     Cervical back: Neck supple.  Skin:    General: Skin is warm and dry.     Capillary Refill: Capillary refill takes less than 2 seconds.  Neurological:     Mental Status: He is alert.  Psychiatric:        Mood and Affect: Mood normal.      ED Course/ Medical Decision Making/ A&P    Procedures .Critical Care  Performed by: Onetha Bile, MD Authorized by: Onetha Bile, MD   Critical care provider statement:    Critical care time (minutes):  30   Critical care was time spent personally by me on the following activities:  Development of treatment plan with patient or surrogate, discussions with consultants, evaluation of patient's response to treatment, examination of patient, ordering and review of laboratory studies, ordering and review of radiographic studies, ordering and performing treatments and interventions, pulse oximetry, re-evaluation of patient's condition and review of old charts    Medications Ordered in ED Medications  ipratropium-albuterol  (DUONEB) 0.5-2.5 (3) MG/3ML nebulizer solution 3 mL (3 mLs Nebulization Given 03/10/24 2346)  albuterol  (PROVENTIL ) (2.5 MG/3ML) 0.083% nebulizer solution 2.5 mg (2.5 mg Nebulization Given 03/10/24 2346)  azithromycin  (ZITHROMAX ) tablet 500 mg (500 mg Oral Given 03/11/24 0026)  chlorpheniramine-HYDROcodone  (TUSSIONEX) 10-8 MG/5ML  suspension 5 mL (5 mLs Oral Given 03/11/24 0026)  ondansetron  (ZOFRAN -ODT) disintegrating tablet 4 mg (4 mg Oral Given 03/11/24 0008)  ipratropium-albuterol  (DUONEB) 0.5-2.5 (3) MG/3ML nebulizer solution 3 mL (3 mLs Nebulization Given 03/11/24 0039)  dexamethasone  (DECADRON ) tablet 12 mg (12 mg Oral Given 03/11/24 0054)  magnesium  sulfate IVPB 2 g 50 mL (2 g Intravenous New Bag/Given 03/11/24 0144)  methylPREDNISolone  sodium succinate (SOLU-MEDROL ) 125 mg/2 mL injection 125 mg (125 mg Intravenous Given 03/11/24 0144)  Medical Decision  Making:   [name] is a 53 y.o. male with a history of COPD, who presented to the ED today with acute on chronic SOB. They are endorsing worsening of their baseline dyspnea over the past 48 hours. Their baseline is a 0L O2 requirement. At their baseline they are able to get around the neighborhood and they are not able to at this time.   On my initial exam, the pt was SOB and tachypneic. Audible wheezing and grossly decreased breath sounds appreciated.  They are endorsing increased sputum production.    Reviewed and confirmed nursing documentation for past medical history, family history, social history.    Initial Assessment:   With the patient's presentation of SOB in the above setting, most likely diagnosis is COPD Exacerbation. Other diagnoses were considered including (but not limited to) CAP, PE, ACS, viral infection, PTX. These are considered less likely due to history of present illness and physical exam findings.   This is most consistent with an acute life/limb threatening illness complicated by underlying chronic conditions.  Initial Plan:  Empiric treatment of patient's symptoms with immediate initiation of inhaled bronchodilators  Evaluation for ACS with EKG  Evaluation for infectious versus intrathoracic abnormality with chest x-ray  Evaluation for volume overload with BNP  Patient's Wells score is low and patient does not warrant further objective evaluation for PE based on consistency of presentation of alternative diagnosis.  Objective evaluation as below reviewed   Initial Study Results:   Laboratory  All laboratory results reviewed without evidence of clinically relevant pathology.    EKG EKG was reviewed independently. Rate, rhythm, axis, intervals all examined and without medically relevant abnormality. ST segments without concerns for elevations.    Radiology:  All images reviewed independently. Agree with radiology report at this time.   DG Chest 2  View Result Date: 03/10/2024 CLINICAL DATA:  Cough and fevers EXAM: CHEST - 2 VIEW COMPARISON:  12/18/2023 FINDINGS: The heart size and mediastinal contours are within normal limits. Both lungs are clear. The visualized skeletal structures are unremarkable. IMPRESSION: No active cardiopulmonary disease. Electronically Signed   By: Violeta Grey M.D.   On: 03/10/2024 21:35    Final Assessment and Plan:   After initiation of medical therapies, patient is grossly improved and no longer in acute distress.    Initially planned to treat as COPD exacerbation with p.o. azithromycin , ambulatory albuterol  which patient has access to at home and follow-up with PCP in 5 days.  Unfortunately, patient had desaturation event to  nadir 85% he had worsening shortness of breath and fatigue. Another DuoNeb was administered with only temporary improvement before he desaturated again.  Started on 2 L nasal cannula Solu-Medrol , IV magnesium  with interval improvement. Lab work drawn to evaluate for more sinister underlying pathology and fortunately reassuring. Patient will require admission for ongoing management of his COPD exacerbation.  Disposition:   Based on the above findings, I believe this patient is stable for admission.    Patient/family educated  about specific findings on our evaluation and explained exact reasons for admission.  Patient/family educated about clinical situation and time was allowed to answer questions.   Admission team communicated with and agreed with need for admission. Patient admitted. Patient ready to move at this time.     Emergency Department Medication Summary:   Medications  ipratropium-albuterol  (DUONEB) 0.5-2.5 (3) MG/3ML nebulizer solution 3 mL (3 mLs Nebulization Given 03/10/24 2346)  albuterol  (PROVENTIL ) (2.5 MG/3ML) 0.083% nebulizer solution 2.5 mg (2.5 mg Nebulization Given 03/10/24 2346)  azithromycin  (ZITHROMAX ) tablet 500 mg (500 mg Oral Given 03/11/24 0026)   chlorpheniramine-HYDROcodone  (TUSSIONEX) 10-8 MG/5ML suspension 5 mL (5 mLs Oral Given 03/11/24 0026)  ondansetron  (ZOFRAN -ODT) disintegrating tablet 4 mg (4 mg Oral Given 03/11/24 0008)  ipratropium-albuterol  (DUONEB) 0.5-2.5 (3) MG/3ML nebulizer solution 3 mL (3 mLs Nebulization Given 03/11/24 0039)  dexamethasone  (DECADRON ) tablet 12 mg (12 mg Oral Given 03/11/24 0054)  magnesium  sulfate IVPB 2 g 50 mL (2 g Intravenous New Bag/Given 03/11/24 0144)  methylPREDNISolone  sodium succinate (SOLU-MEDROL ) 125 mg/2 mL injection 125 mg (125 mg Intravenous Given 03/11/24 0144)          Emergency Department Medication Summary:   Medications  ipratropium-albuterol  (DUONEB) 0.5-2.5 (3) MG/3ML nebulizer solution 3 mL (3 mLs Nebulization Given 03/10/24 2346)  albuterol  (PROVENTIL ) (2.5 MG/3ML) 0.083% nebulizer solution 2.5 mg (2.5 mg Nebulization Given 03/10/24 2346)  azithromycin  (ZITHROMAX ) tablet 500 mg (500 mg Oral Given 03/11/24 0026)  chlorpheniramine-HYDROcodone  (TUSSIONEX) 10-8 MG/5ML suspension 5 mL (5 mLs Oral Given 03/11/24 0026)  ondansetron  (ZOFRAN -ODT) disintegrating tablet 4 mg (4 mg Oral Given 03/11/24 0008)  ipratropium-albuterol  (DUONEB) 0.5-2.5 (3) MG/3ML nebulizer solution 3 mL (3 mLs Nebulization Given 03/11/24 0039)  dexamethasone  (DECADRON ) tablet 12 mg (12 mg Oral Given 03/11/24 0054)  magnesium  sulfate IVPB 2 g 50 mL (2 g Intravenous New Bag/Given 03/11/24 0144)  methylPREDNISolone  sodium succinate (SOLU-MEDROL ) 125 mg/2 mL injection 125 mg (125 mg Intravenous Given 03/11/24 0144)         Clinical Impression:  1. Chronic obstructive pulmonary disease with acute exacerbation (HCC)      Admit     Final Clinical Impression(s) / ED Diagnoses Final diagnoses:  Chronic obstructive pulmonary disease with acute exacerbation (HCC)    Rx / DC Orders ED Discharge Orders          Ordered    benzonatate  (TESSALON ) 100 MG capsule  Every 8 hours        03/11/24 0001    ondansetron   (ZOFRAN ) 4 MG tablet  Every 6 hours        03/11/24 0001    azithromycin  (ZITHROMAX ) 250 MG tablet  Daily        03/10/24 2348              Onetha Bile, MD 03/10/24 2348    Onetha Bile, MD 03/11/24 1610    Onetha Bile, MD 03/11/24 [phone]

## 2024-03-11 ENCOUNTER — Encounter (HOSPITAL_COMMUNITY): Payer: Self-pay | Admitting: Internal Medicine

## 2024-03-11 ENCOUNTER — Other Ambulatory Visit: Payer: Self-pay

## 2024-03-11 ENCOUNTER — Inpatient Hospital Stay (HOSPITAL_COMMUNITY)

## 2024-03-11 DIAGNOSIS — I700 Atherosclerosis of aorta: Secondary | ICD-10-CM | POA: Diagnosis present

## 2024-03-11 DIAGNOSIS — Z825 Family history of asthma and other chronic lower respiratory diseases: Secondary | ICD-10-CM | POA: Diagnosis not present

## 2024-03-11 DIAGNOSIS — Z79899 Other long term (current) drug therapy: Secondary | ICD-10-CM | POA: Diagnosis not present

## 2024-03-11 DIAGNOSIS — R0602 Shortness of breath: Secondary | ICD-10-CM | POA: Diagnosis not present

## 2024-03-11 DIAGNOSIS — Z1152 Encounter for screening for COVID-19: Secondary | ICD-10-CM | POA: Diagnosis not present

## 2024-03-11 DIAGNOSIS — Z888 Allergy status to other drugs, medicaments and biological substances status: Secondary | ICD-10-CM | POA: Diagnosis not present

## 2024-03-11 DIAGNOSIS — F909 Attention-deficit hyperactivity disorder, unspecified type: Secondary | ICD-10-CM | POA: Diagnosis present

## 2024-03-11 DIAGNOSIS — I5189 Other ill-defined heart diseases: Secondary | ICD-10-CM | POA: Diagnosis not present

## 2024-03-11 DIAGNOSIS — I7090 Unspecified atherosclerosis: Secondary | ICD-10-CM | POA: Diagnosis not present

## 2024-03-11 DIAGNOSIS — Z7951 Long term (current) use of inhaled steroids: Secondary | ICD-10-CM | POA: Diagnosis not present

## 2024-03-11 DIAGNOSIS — J9601 Acute respiratory failure with hypoxia: Secondary | ICD-10-CM | POA: Diagnosis present

## 2024-03-11 DIAGNOSIS — F1011 Alcohol abuse, in remission: Secondary | ICD-10-CM | POA: Diagnosis present

## 2024-03-11 DIAGNOSIS — J441 Chronic obstructive pulmonary disease with (acute) exacerbation: Secondary | ICD-10-CM | POA: Diagnosis present

## 2024-03-11 DIAGNOSIS — K219 Gastro-esophageal reflux disease without esophagitis: Secondary | ICD-10-CM | POA: Diagnosis present

## 2024-03-11 DIAGNOSIS — Z716 Tobacco abuse counseling: Secondary | ICD-10-CM | POA: Diagnosis not present

## 2024-03-11 DIAGNOSIS — F17210 Nicotine dependence, cigarettes, uncomplicated: Secondary | ICD-10-CM | POA: Diagnosis present

## 2024-03-11 DIAGNOSIS — B9781 Human metapneumovirus as the cause of diseases classified elsewhere: Secondary | ICD-10-CM | POA: Diagnosis present

## 2024-03-11 LAB — RESPIRATORY PANEL BY PCR

## 2024-03-11 LAB — ECHOCARDIOGRAM COMPLETE
Area-P 1/2: 3.91 cm2
Calc EF: 73 %
Est EF: >75
Height: 66 in
S' Lateral: 2.1 cm
Single Plane A2C EF: 70.9 %
Single Plane A4C EF: 74.7 %
Weight: 2673.74 [oz_av]

## 2024-03-11 LAB — CBC WITH DIFFERENTIAL/PLATELET
Abs Immature Granulocytes: 0.02 10*3/uL (ref 0.00–0.07)
Basophils Absolute: 0 10*3/uL (ref 0.0–0.1)
Basophils Relative: 0 %
Eosinophils Absolute: 0 10*3/uL (ref 0.0–0.5)
Eosinophils Relative: 0 %
HCT: 42.5 % (ref 39.0–52.0)
Hemoglobin: 14.2 g/dL (ref 13.0–17.0)
Immature Granulocytes: 0 %
Lymphocytes Relative: 24 %
Lymphs Abs: 2.3 10*3/uL (ref 0.7–4.0)
MCH: 31.6 pg (ref 26.0–34.0)
MCHC: 33.4 g/dL (ref 30.0–36.0)
MCV: 94.7 fL (ref 80.0–100.0)
Monocytes Absolute: 0.8 10*3/uL (ref 0.1–1.0)
Monocytes Relative: 9 %
Neutro Abs: 6.6 10*3/uL (ref 1.7–7.7)
Neutrophils Relative %: 67 %
Platelets: 233 10*3/uL (ref 150–400)
RBC: 4.49 MIL/uL (ref 4.22–5.81)
RDW: 14.7 % (ref 11.5–15.5)
WBC: 9.8 10*3/uL (ref 4.0–10.5)
nRBC: 0 % (ref 0.0–0.2)

## 2024-03-11 LAB — CBC
HCT: 43.8 % (ref 39.0–52.0)
Hemoglobin: 14.2 g/dL (ref 13.0–17.0)
MCH: 31.7 pg (ref 26.0–34.0)
MCHC: 32.4 g/dL (ref 30.0–36.0)
MCV: 97.8 fL (ref 80.0–100.0)
Platelets: 225 10*3/uL (ref 150–400)
RBC: 4.48 MIL/uL (ref 4.22–5.81)
RDW: 14.7 % (ref 11.5–15.5)
WBC: 8.1 10*3/uL (ref 4.0–10.5)
nRBC: 0 % (ref 0.0–0.2)

## 2024-03-11 LAB — BASIC METABOLIC PANEL WITH GFR
Anion gap: 11 (ref 5–15)
BUN: 20 mg/dL (ref 6–20)
CO2: 26 mmol/L (ref 22–32)
Calcium: 8.9 mg/dL (ref 8.9–10.3)
Chloride: 98 mmol/L (ref 98–111)
Creatinine, Ser: 1.12 mg/dL (ref 0.61–1.24)
GFR, Estimated: >60 mL/min (ref >60)
Glucose, Bld: 113 mg/dL — ABNORMAL HIGH (ref 70–99)
Potassium: 4.5 mmol/L (ref 3.5–5.1)
Sodium: 135 mmol/L (ref 135–145)

## 2024-03-11 LAB — TROPONIN T, HIGH SENSITIVITY: Troponin T High Sensitivity: <15 ng/L (ref <19)

## 2024-03-11 MED ORDER — ACETAMINOPHEN 650 MG RE SUPP: 650.0000 mg | Freq: Four times a day (QID) | RECTAL | Status: DC | PRN

## 2024-03-11 MED ORDER — ONDANSETRON HCL 4 MG/2ML IJ SOLN
4.0000 mg | Freq: Four times a day (QID) | INTRAMUSCULAR | Status: DC | PRN
Start: 2024-03-11 — End: 2024-03-14

## 2024-03-11 MED ORDER — ONDANSETRON 4 MG PO TBDP
4.0000 mg | ORAL_TABLET | Freq: Once | ORAL | Status: AC
  Administered 2024-03-11: 4 mg via ORAL
  Filled 2024-03-11: qty 1

## 2024-03-11 MED ORDER — LORATADINE 10 MG PO TABS
10.0000 mg | ORAL_TABLET | Freq: Every day | ORAL | Status: DC
  Administered 2024-03-11 – 2024-03-12 (×2): 10 mg via ORAL
  Filled 2024-03-11 (×2): qty 1

## 2024-03-11 MED ORDER — PANTOPRAZOLE SODIUM 40 MG PO TBEC
40.0000 mg | DELAYED_RELEASE_TABLET | Freq: Every day | ORAL | Status: DC
  Administered 2024-03-11 – 2024-03-14 (×4): 40 mg via ORAL
  Filled 2024-03-11 (×4): qty 1

## 2024-03-11 MED ORDER — BENZONATATE 100 MG PO CAPS: 100.0000 mg | ORAL_CAPSULE | Freq: Three times a day (TID) | ORAL | 0 refills | Status: DC

## 2024-03-11 MED ORDER — ALBUTEROL SULFATE (2.5 MG/3ML) 0.083% IN NEBU
2.5000 mg | INHALATION_SOLUTION | RESPIRATORY_TRACT | Status: DC | PRN
  Administered 2024-03-12 – 2024-03-13 (×2): 2.5 mg via RESPIRATORY_TRACT
  Filled 2024-03-11 (×2): qty 3

## 2024-03-11 MED ORDER — MAGNESIUM SULFATE 2 GM/50ML IV SOLN
2.0000 g | Freq: Once | INTRAVENOUS | Status: AC
  Administered 2024-03-11: 2 g via INTRAVENOUS
  Filled 2024-03-11: qty 50

## 2024-03-11 MED ORDER — MONTELUKAST SODIUM 10 MG PO TABS
10.0000 mg | ORAL_TABLET | Freq: Every day | ORAL | Status: DC
  Administered 2024-03-11 – 2024-03-12 (×2): 10 mg via ORAL
  Filled 2024-03-11 (×2): qty 1

## 2024-03-11 MED ORDER — DEXAMETHASONE 4 MG PO TABS
12.0000 mg | ORAL_TABLET | Freq: Once | ORAL | Status: AC
  Administered 2024-03-11: 12 mg via ORAL
  Filled 2024-03-11: qty 3

## 2024-03-11 MED ORDER — ONDANSETRON HCL 4 MG PO TABS: 4.0000 mg | ORAL_TABLET | Freq: Four times a day (QID) | ORAL | Status: DC | PRN

## 2024-03-11 MED ORDER — ENOXAPARIN SODIUM 40 MG/0.4ML IJ SOSY
40.0000 mg | PREFILLED_SYRINGE | INTRAMUSCULAR | Status: DC
  Administered 2024-03-11 – 2024-03-13 (×3): 40 mg via SUBCUTANEOUS
  Filled 2024-03-11 (×3): qty 0.4

## 2024-03-11 MED ORDER — ONDANSETRON HCL 4 MG PO TABS: 4.0000 mg | ORAL_TABLET | Freq: Four times a day (QID) | ORAL | 0 refills | Status: DC

## 2024-03-11 MED ORDER — ACETAMINOPHEN 325 MG PO TABS: 650.0000 mg | ORAL_TABLET | Freq: Four times a day (QID) | ORAL | Status: DC | PRN

## 2024-03-11 MED ORDER — ORAL CARE MOUTH RINSE: 15.0000 mL | OROMUCOSAL | Status: DC | PRN

## 2024-03-11 MED ORDER — IPRATROPIUM-ALBUTEROL 0.5-2.5 (3) MG/3ML IN SOLN
3.0000 mL | Freq: Three times a day (TID) | RESPIRATORY_TRACT | Status: DC
  Administered 2024-03-11 – 2024-03-13 (×6): 3 mL via RESPIRATORY_TRACT
  Filled 2024-03-11 (×6): qty 3

## 2024-03-11 MED ORDER — PREDNISONE 20 MG PO TABS
40.0000 mg | ORAL_TABLET | Freq: Every day | ORAL | Status: DC
  Administered 2024-03-12 – 2024-03-14 (×3): 40 mg via ORAL
  Filled 2024-03-11 (×3): qty 2

## 2024-03-11 MED ORDER — SODIUM CHLORIDE 0.9 % IV SOLN
500.0000 mg | INTRAVENOUS | Status: DC
  Administered 2024-03-11 – 2024-03-12 (×2): 500 mg via INTRAVENOUS
  Filled 2024-03-11 (×2): qty 5

## 2024-03-11 MED ORDER — IPRATROPIUM-ALBUTEROL 0.5-2.5 (3) MG/3ML IN SOLN: 3.0000 mL | RESPIRATORY_TRACT | Status: DC | PRN

## 2024-03-11 MED ORDER — IPRATROPIUM-ALBUTEROL 0.5-2.5 (3) MG/3ML IN SOLN
3.0000 mL | Freq: Four times a day (QID) | RESPIRATORY_TRACT | Status: DC
  Administered 2024-03-11: 3 mL via RESPIRATORY_TRACT
  Filled 2024-03-11: qty 3

## 2024-03-11 MED ORDER — IPRATROPIUM-ALBUTEROL 0.5-2.5 (3) MG/3ML IN SOLN
3.0000 mL | Freq: Once | RESPIRATORY_TRACT | Status: AC
  Administered 2024-03-11: 3 mL via RESPIRATORY_TRACT
  Filled 2024-03-11: qty 3

## 2024-03-11 MED ORDER — METHYLPREDNISOLONE SODIUM SUCC 125 MG IJ SOLR
125.0000 mg | Freq: Once | INTRAMUSCULAR | Status: AC
  Administered 2024-03-11: 125 mg via INTRAVENOUS
  Filled 2024-03-11: qty 2

## 2024-03-11 NOTE — H&P (Signed)
 History and Physical    Patient: [name] Deady ZOX:[id number] DOB: 1970-12-21 DOA: 03/10/2024 DOS: the patient was seen and examined on 03/11/2024 PCP: Pcp, No  Patient coming from: Home  Chief Complaint:  Chief Complaint  Patient presents with   Fever   Cough   HPI: [name] is a 53 y.o. male with medical history significant of ADHD, atherosclerosis, palpitations, history of alcohol abuse in remission, tobacco use but not in the last 2 days, asthma, COPD, seasonal allergies, GERD, history of heart murmur as a child who presented to the emergency department complaints of fever and cough. He denied rhinorrhea, sore throat, wheezing or hemoptysis. No chest pain, palpitations, diaphoresis, PND, orthopnea or pitting edema of the lower extremities.  No abdominal pain, nausea, emesis, diarrhea, constipation, melena or hematochezia.  No flank pain, dysuria, frequency or hematuria.  No polyuria, polydipsia, polyphagia or blurred vision.  No sick contacts or travel history.  Lab work: Coronavirus, influenza and RSV PCR was negative.  CBC showed a white count of 9.8, hemoglobin 14.2 g/dL platelets 811.  BMP was unremarkable with the exception of a glucose of 113 mg/dL.  Troponin T was less than 15 ng/L.  Imaging: 2 view chest radiograph shows normal heart size and mediastinal contours.  Both lungs were clear.   ED course: Initial vital signs were temperature 100.2 F, pulse 124, respiration 25, BP 130/76 mmHg O2 sat 90% on room air.  The patient was placed on nasal cannula oxygen at 2 LPM.  He received an albuterol  neb, 2 DuoNebs, dexamethasone  12 mg IVP, azithromycin  500 mg IVPB x 1, magnesium  sulfate 2 g IVPB, methylprednisolone  125 mg IVP and ondansetron  4 mg ODT p.o. x 1.  Review of Systems: As mentioned in the history of present illness. All other systems reviewed and are negative.  Past Medical History:  Diagnosis Date   ADHD (attention deficit hyperactivity disorder)    Asthma     COPD (chronic obstructive pulmonary disease) (HCC)    GERD (gastroesophageal reflux disease)    Heart murmur    as a child    Past Surgical History:  Procedure Laterality Date   cervical fracture repair     SHOULDER ARTHROSCOPY WITH ROTATOR CUFF REPAIR AND SUBACROMIAL DECOMPRESSION Right 02/14/2020   Procedure: SHOULDER ARTHROSCOPY WITH MINI OPEN ROTATOR CUFF REPAIR AND SUBACROMIAL DECOMPRESSION AND DEBRIDEMENT;  Surgeon: Orvan Blanch, MD;  Location: [facility] ORS;  Service: Orthopedics;  Laterality: Right;  90 MINS BLOCK OK- NO EXPAREL    Social History:  reports that he has been smoking cigarettes. He started smoking about 45 years ago. He has a 37.1 pack-year smoking history. He has been exposed to tobacco smoke. He has never used smokeless tobacco. He reports that he does not drink alcohol and does not use drugs.  Allergies  Allergen Reactions   Grass Pollen(K-O-R-T-Swt Vern) Anaphylaxis    Urticaria, itch, swelling    Family History  Problem Relation Age of Onset   COPD Mother    Allergic rhinitis Neg Hx    Angioedema Neg Hx    Asthma Neg Hx    Atopy Neg Hx    Eczema Neg Hx    Immunodeficiency Neg Hx    Urticaria Neg Hx    Colon cancer Neg Hx    Rectal cancer Neg Hx    Stomach cancer Neg Hx     Prior to Admission medications   Medication Sig Start Date End Date Taking? Authorizing Provider  azithromycin  (ZITHROMAX ) 250 MG tablet  Take 1 tablet (250 mg total) by mouth daily. 03/10/24  Yes Onetha Bile, MD  benzonatate  (TESSALON ) 100 MG capsule Take 1 capsule (100 mg total) by mouth every 8 (eight) hours. 03/11/24  Yes Onetha Bile, MD  ondansetron  (ZOFRAN ) 4 MG tablet Take 1 tablet (4 mg total) by mouth every 6 (six) hours. 03/11/24  Yes Onetha Bile, MD  albuterol  (PROVENTIL ) (2.5 MG/3ML) 0.083% nebulizer solution Take 3 mLs (2.5 mg total) by nebulization every 6 (six) hours as needed for wheezing or shortness of breath. 06/29/23   Cobb, Mariah Shines, NP  albuterol   (VENTOLIN  HFA) 108 (90 Base) MCG/ACT inhaler INHALE 2 PUFFS BY MOUTH EVERY 4 HOURS AS NEEDED FOR WHEEZE OR FOR SHORTNESS OF BREATH 07/10/23   Cobb, Mariah Shines, NP  [facility] OLOPATADINE  HCL 0.2 % SOLN SMARTSIG:1 Drop(s) In Eye(s) Daily PRN 01/22/24   [provider]  EPINEPHrine  0.3 mg/0.3 mL IJ SOAJ injection Inject 0.3 mg into the muscle as needed for anaphylaxis. 03/21/23   Orelia Binet, MD  glucosamine-chondroitin 500-400 MG tablet Take 2 tablets by mouth at bedtime.    [provider]  levocetirizine (XYZAL ) 5 MG tablet Take 1 tablet (5 mg total) by mouth every evening. Patient taking differently: Take 5 mg by mouth as needed. 02/02/23   Sean Czar, MD  montelukast  (SINGULAIR ) 10 MG tablet TAKE 1 TABLET BY MOUTH EVERYDAY AT BEDTIME 10/03/23   Mannam, Praveen, MD  Multiple Vitamin (MULTIVITAMIN WITH MINERALS) TABS tablet Take 1 tablet by mouth daily.    [provider]  pantoprazole  (PROTONIX ) 40 MG tablet TAKE 1 TABLET BY MOUTH EVERY DAY 11/10/23   Cleo Dace, Mariah Shines, NP  TRELEGY ELLIPTA  200-62.5-25 MCG/ACT AEPB INHALE 1 PUFF BY MOUTH EVERY DAY 02/06/24   Mannam, Praveen, MD    Physical Exam: Vitals:   03/10/24 2347 03/11/24 0100 03/11/24 0400 03/11/24 0622  BP:  122/81 131/83 110/76  Pulse:  (!) 112 99 88  Resp:  18 20 20   Temp:  99 F (37.2 C)  98.2 F (36.8 C)  TempSrc:  Oral  Oral  SpO2: 99% 92% 93% 94%  Weight:    75.8 kg  Height:    5\' 6"  (1.676 m)   Physical Exam Vitals and nursing note reviewed.  Constitutional:      General: He is awake. He is not in acute distress.    Appearance: Normal appearance. He is ill-appearing.     Interventions: Nasal cannula in place.  HENT:     Head: Normocephalic.     Nose: No rhinorrhea.     Mouth/Throat:     Mouth: Mucous membranes are moist.  Eyes:     General: No scleral icterus.    Pupils: Pupils are equal, round, and reactive to light.  Neck:     Vascular: No JVD.  Cardiovascular:     Rate and Rhythm:  Normal rate and regular rhythm.     Heart sounds: S1 normal and S2 normal.  Pulmonary:     Effort: No accessory muscle usage.     Breath sounds: Decreased air movement present. Wheezing and rhonchi present. No rales.  Abdominal:     General: Bowel sounds are normal. There is no distension.     Palpations: Abdomen is soft.     Tenderness: There is no abdominal tenderness. There is no guarding.  Musculoskeletal:     Cervical back: Neck supple.     Right lower leg: No edema.     Left lower leg:  No edema.  Skin:    General: Skin is warm and dry.  Neurological:     General: No focal deficit present.     Mental Status: He is alert and oriented to person, place, and time.  Psychiatric:        Mood and Affect: Mood normal.        Behavior: Behavior normal. Behavior is cooperative.     Data Reviewed:  Results are pending, will review when available.  EKG: Vent. rate 105 BPM PR interval 134 ms QRS duration 112 ms QT/QTcB 317/419 ms P-R-T axes 76 85 59 Sinus tachycardia IRBBB and LPFB ST elevation suggests acute pericarditis  Assessment and Plan: Principal Problem:   COPD with exacerbation (HCC) Observation/telemetry Continue supplemental oxygen. Received dexamethasone  12 mg p.o. Also had methylprednisolone  125 mg IVP x1. Will continue Zithromax  500 mg IVPB. Followed by prednisone  40 mg p.o. daily in a.m. Continue formulary equivalent antihistamine. Continue montelukast  10 mg p.o. daily. Scheduled and as needed bronchodilators. Follow-up CBC and chemistry in the morning.   Active Problems:   Gastroesophageal reflux disease Continue pantoprazole  40 mg p.o. daily.    Atherosclerosis, aortic Smoking cessation advised. Follow-up with primary care provider.   Advance Care Planning:   Code Status: Full Code   Consults:  Family Communication:   Severity of Illness: The appropriate patient status for this patient is INPATIENT. Inpatient status is judged to be reasonable  and necessary in order to provide the required intensity of service to ensure the patient's safety. The patient's presenting symptoms, physical exam findings, and initial radiographic and laboratory data in the context of their chronic comorbidities is felt to place them at high risk for further clinical deterioration. Furthermore, it is not anticipated that the patient will be medically stable for discharge from the hospital within 2 midnights of admission.   * I certify that at the point of admission it is my clinical judgment that the patient will require inpatient hospital care spanning beyond 2 midnights from the point of admission due to high intensity of service, high risk for further deterioration and high frequency of surveillance required.*  Author: Danice Dural, MD 03/11/2024 8:20 AM  For on call review [email or web address].   This document was prepared using Dragon voice recognition software and may contain some unintended transcription errors.

## 2024-03-11 NOTE — Plan of Care (Signed)
  Problem: Clinical Measurements: Goal: Respiratory complications will improve Outcome: Progressing   Problem: Nutrition: Goal: Adequate nutrition will be maintained Outcome: Progressing   Problem: Safety: Goal: Ability to remain free from injury will improve Outcome: Progressing   

## 2024-03-11 NOTE — ED Notes (Signed)
 [facility] called to re-page hospitalist.

## 2024-03-11 NOTE — Plan of Care (Signed)
  Problem: Education: Goal: Knowledge of General Education information will improve Description: Including pain rating scale, medication(s)/side effects and non-pharmacologic comfort measures Outcome: Progressing   Problem: Clinical Measurements: Goal: Ability to maintain clinical measurements within normal limits will improve Outcome: Progressing Goal: Respiratory complications will improve Outcome: Progressing   Problem: Pain Managment: Goal: General experience of comfort will improve and/or be controlled Outcome: Progressing   Problem: Education: Goal: Knowledge of disease or condition will improve Outcome: Progressing   Problem: Respiratory: Goal: Ability to maintain a clear airway will improve Outcome: Progressing

## 2024-03-11 NOTE — Progress Notes (Signed)
  Echocardiogram 2D Echocardiogram has been performed.  [name] 03/11/2024, 3:09 PM

## 2024-03-12 DIAGNOSIS — J441 Chronic obstructive pulmonary disease with (acute) exacerbation: Secondary | ICD-10-CM | POA: Diagnosis not present

## 2024-03-12 LAB — CBC
HCT: 43.4 % (ref 39.0–52.0)
Hemoglobin: 14.1 g/dL (ref 13.0–17.0)
MCH: 31.8 pg (ref 26.0–34.0)
MCHC: 32.5 g/dL (ref 30.0–36.0)
MCV: 98 fL (ref 80.0–100.0)
Platelets: 237 10*3/uL (ref 150–400)
RBC: 4.43 MIL/uL (ref 4.22–5.81)
RDW: 14.5 % (ref 11.5–15.5)
WBC: 16.9 10*3/uL — ABNORMAL HIGH (ref 4.0–10.5)
nRBC: 0 % (ref 0.0–0.2)

## 2024-03-12 LAB — COMPREHENSIVE METABOLIC PANEL WITH GFR
ALT: 32 U/L (ref 0–44)
AST: 24 U/L (ref 15–41)
Albumin: 3.4 g/dL — ABNORMAL LOW (ref 3.5–5.0)
Alkaline Phosphatase: 54 U/L (ref 38–126)
Anion gap: 7 (ref 5–15)
BUN: 20 mg/dL (ref 6–20)
CO2: 25 mmol/L (ref 22–32)
Calcium: 8.7 mg/dL — ABNORMAL LOW (ref 8.9–10.3)
Chloride: 104 mmol/L (ref 98–111)
Creatinine, Ser: 0.78 mg/dL (ref 0.61–1.24)
GFR, Estimated: >60 mL/min (ref >60)
Glucose, Bld: 134 mg/dL — ABNORMAL HIGH (ref 70–99)
Potassium: 4.7 mmol/L (ref 3.5–5.1)
Sodium: 136 mmol/L (ref 135–145)
Total Bilirubin: 0.7 mg/dL (ref 0.0–1.2)
Total Protein: 7 g/dL (ref 6.5–8.1)

## 2024-03-12 MED ORDER — BUDESON-GLYCOPYRROL-FORMOTEROL 160-9-4.8 MCG/ACT IN AERO
2.0000 | INHALATION_SPRAY | Freq: Two times a day (BID) | RESPIRATORY_TRACT | Status: DC
  Administered 2024-03-12 – 2024-03-13 (×2): 2 via RESPIRATORY_TRACT
  Filled 2024-03-12: qty 5.9

## 2024-03-12 MED ORDER — CETIRIZINE HCL 10 MG PO TABS
10.0000 mg | ORAL_TABLET | Freq: Every day | ORAL | Status: DC
  Administered 2024-03-13: 10 mg via ORAL
  Filled 2024-03-12: qty 1

## 2024-03-12 MED ORDER — GUAIFENESIN ER 600 MG PO TB12
600.0000 mg | ORAL_TABLET | Freq: Two times a day (BID) | ORAL | Status: DC
  Administered 2024-03-12 – 2024-03-14 (×4): 600 mg via ORAL
  Filled 2024-03-12 (×4): qty 1

## 2024-03-12 MED ORDER — LEVOCETIRIZINE DIHYDROCHLORIDE 5 MG PO TABS: 5.0000 mg | ORAL_TABLET | Freq: Every evening | ORAL | Status: DC

## 2024-03-12 NOTE — Progress Notes (Addendum)
 PROGRESS NOTE    [name]  XBM:[id number] DOB: 10-Feb-1971 DOA: 03/10/2024 PCP: Vicente Graham, No   Brief Narrative:  [name] is a 53 y.o. male with medical history significant of ADHD, atherosclerosis, palpitations, history of alcohol abuse in remission, tobacco use but not in the last 2 days, asthma, COPD, seasonal allergies, GERD, history of heart murmur as a child who presented to the emergency department complaints of fever and cough. Found to be profoundly hypoxic in the ED - hospitalist called for admission.   Assessment & Plan:   Principal Problem:   COPD with exacerbation (HCC) Active Problems:   Gastroesophageal reflux disease   Atherosclerosis   Acute hypoxic respiratory failure secondary to COPD exacerbation Acute metapneumovirus infection Viral panel positive for metapneumovirus Continue nebs/steroids/supportive care Flutter/incentive ongoing Azithromycin  x5 days Pred taper once clinically improving Continue montelukast /antihistamine Scheduled and as needed bronchodilators. Wean oxygen as tolerated to room air  Gastroesophageal reflux disease Continue pantoprazole  40 mg p.o. daily.   Atherosclerosis, aorta, chronic Smoking cessation advised. Not on aspirin/statin per med rec   DVT prophylaxis: enoxaparin  (LOVENOX ) injection 40 mg Start: 03/11/24 2200 Code Status:   Code Status: Full Code Family Communication: None present  Status is: Inpt  Dispo: The patient is from: Home              Anticipated d/c is to: Home              Anticipated d/c date is: 48-72h              Patient currently NOT medically stable for discharge  Consultants:  None  Procedures:  None  Antimicrobials:  Azithromycin    Subjective: No acute issue/events overnight -respiratory status improving but not yet back to baseline  Objective: Vitals:   03/11/24 1500 03/11/24 1842 03/11/24 2310 03/12/24 0539  BP: 138/74 135/78 131/80 136/84  Pulse: 100 99 94 83   Resp:  15 (!) 22 20  Temp:  98.1 F (36.7 C) (!) 97.5 F (36.4 C) (!) 97.5 F (36.4 C)  TempSrc:   Oral Oral  SpO2: 93% 92% 95% 97%  Weight:      Height:        Intake/Output Summary (Last 24 hours) at 03/12/2024 0810 Last data filed at 03/11/2024 1846 Gross per 24 hour  Intake 960 ml  Output --  Net 960 ml   Filed Weights   03/10/24 2030 03/11/24 0622  Weight: 77.1 kg 75.8 kg    Examination:  General exam: Appears calm and comfortable  Respiratory system: Profound expiratory wheeze Cardiovascular system: S1 & S2 heard, RRR. No JVD, murmurs, rubs, gallops or clicks. No pedal edema. Gastrointestinal system: Abdomen is nondistended, soft and nontender. No organomegaly or masses felt. Normal bowel sounds heard. Central nervous system: Alert and oriented. No focal neurological deficits. Extremities: Symmetric 5 x 5 power. Skin: No rashes, lesions or ulcers Psychiatry: Judgement and insight appear normal. Mood & affect appropriate.     Data Reviewed: I have personally reviewed following labs and imaging studies  CBC: Recent Labs  Lab 03/11/24 0120 03/11/24 0837 03/12/24 0543  WBC 9.8 8.1 16.9*  NEUTROABS 6.6  --   --   HGB 14.2 14.2 14.1  HCT 42.5 43.8 43.4  MCV 94.7 97.8 98.0  PLT 233 225 237   Basic Metabolic Panel: Recent Labs  Lab 03/11/24 0120 03/12/24 0543  NA 135 136  K 4.5 4.7  CL 98 104  CO2 26 25  GLUCOSE 113*  134*  BUN 20 20  CREATININE 1.12 0.78  CALCIUM  8.9 8.7*   GFR: Estimated Creatinine Clearance: 94.1 mL/min (by C-G formula based on SCr of 0.78 mg/dL). Liver Function Tests: Recent Labs  Lab 03/12/24 0543  AST 24  ALT 32  ALKPHOS 54  BILITOT 0.7  PROT 7.0  ALBUMIN 3.4*   No results for input(s): "LIPASE", "AMYLASE" in the last 168 hours. No results for input(s): "AMMONIA" in the last 168 hours. Coagulation Profile: No results for input(s): "INR", "PROTIME" in the last 168 hours. Cardiac Enzymes: No results for input(s):  "CKTOTAL", "CKMB", "CKMBINDEX", "TROPONINI" in the last 168 hours. BNP (last 3 results) No results for input(s): "PROBNP" in the last 8760 hours. HbA1C: No results for input(s): "HGBA1C" in the last 72 hours. CBG: No results for input(s): "GLUCAP" in the last 168 hours. Lipid Profile: No results for input(s): "CHOL", "HDL", "LDLCALC", "TRIG", "CHOLHDL", "LDLDIRECT" in the last 72 hours. Thyroid  Function Tests: No results for input(s): "TSH", "T4TOTAL", "FREET4", "T3FREE", "THYROIDAB" in the last 72 hours. Anemia Panel: No results for input(s): "VITAMINB12", "FOLATE", "FERRITIN", "TIBC", "IRON", "RETICCTPCT" in the last 72 hours. Sepsis Labs: No results for input(s): "PROCALCITON", "LATICACIDVEN" in the last 168 hours.  Recent Results (from the past 240 hours)  Resp panel by RT-PCR (RSV, Flu A&B, Covid) Anterior Nasal Swab     Status: None   Collection Time: 03/10/24  8:31 PM   Specimen: Anterior Nasal Swab  Result Value Ref Range Status   SARS Coronavirus 2 by RT PCR NEGATIVE NEGATIVE Final    Comment: (NOTE) SARS-CoV-2 target nucleic acids are NOT DETECTED.  The SARS-CoV-2 RNA is generally detectable in upper respiratory specimens during the acute phase of infection. The lowest concentration of SARS-CoV-2 viral copies this assay can detect is 138 copies/mL. A negative result does not preclude SARS-Cov-2 infection and should not be used as the sole basis for treatment or other patient management decisions. A negative result may occur with  improper specimen collection/handling, submission of specimen other than nasopharyngeal swab, presence of viral mutation(s) within the areas targeted by this assay, and inadequate number of viral copies(<138 copies/mL). A negative result must be combined with clinical observations, patient history, and epidemiological information. The expected result is Negative.  Fact Sheet for Patients:  BloggerCourse.com  Fact  Sheet for Healthcare Providers:  SeriousBroker.it  This test is no t yet approved or cleared by the [place]  FDA and  has been authorized for detection and/or diagnosis of SARS-CoV-2 by FDA under an Emergency Use Authorization (EUA). This EUA will remain  in effect (meaning this test can be used) for the duration of the COVID-19 declaration under Section 564(b)(1) of the Act, 21 U.S.C.section 360bbb-3(b)(1), unless the authorization is terminated  or revoked sooner.       Influenza A by PCR NEGATIVE NEGATIVE Final   Influenza B by PCR NEGATIVE NEGATIVE Final    Comment: (NOTE) The Xpert Xpress SARS-CoV-2/FLU/RSV plus assay is intended as an aid in the diagnosis of influenza from Nasopharyngeal swab specimens and should not be used as a sole basis for treatment. Nasal washings and aspirates are unacceptable for Xpert Xpress SARS-CoV-2/FLU/RSV testing.  Fact Sheet for Patients: BloggerCourse.com  Fact Sheet for Healthcare Providers: SeriousBroker.it  This test is not yet approved or cleared by the [place]  FDA and has been authorized for detection and/or diagnosis of SARS-CoV-2 by FDA under an Emergency Use Authorization (EUA). This EUA will remain in effect (meaning this test can be  used) for the duration of the COVID-19 declaration under Section 564(b)(1) of the Act, 21 U.S.C. section 360bbb-3(b)(1), unless the authorization is terminated or revoked.     Resp Syncytial Virus by PCR NEGATIVE NEGATIVE Final    Comment: (NOTE) Fact Sheet for Patients: BloggerCourse.com  Fact Sheet for Healthcare Providers: SeriousBroker.it  This test is not yet approved or cleared by the [place]  FDA and has been authorized for detection and/or diagnosis of SARS-CoV-2 by FDA under an Emergency Use Authorization (EUA). This EUA will remain in effect  (meaning this test can be used) for the duration of the COVID-19 declaration under Section 564(b)(1) of the Act, 21 U.S.C. section 360bbb-3(b)(1), unless the authorization is terminated or revoked.  Performed at [facility], [address], [place], [place] [address]   Respiratory (~20 pathogens) panel by PCR     Status: Abnormal   Collection Time: 03/11/24  2:37 PM   Specimen: Nasopharyngeal Swab; Respiratory  Result Value Ref Range Status   Adenovirus NOT DETECTED NOT DETECTED Final   Coronavirus 229E NOT DETECTED NOT DETECTED Final    Comment: (NOTE) The Coronavirus on the Respiratory Panel, DOES NOT test for the novel  Coronavirus (2019 nCoV)    Coronavirus HKU1 NOT DETECTED NOT DETECTED Final   Coronavirus NL63 NOT DETECTED NOT DETECTED Final   Coronavirus OC43 NOT DETECTED NOT DETECTED Final   Metapneumovirus DETECTED (A) NOT DETECTED Final   Rhinovirus / Enterovirus NOT DETECTED NOT DETECTED Final   Influenza A NOT DETECTED NOT DETECTED Final   Influenza B NOT DETECTED NOT DETECTED Final   Parainfluenza Virus 1 NOT DETECTED NOT DETECTED Final   Parainfluenza Virus 2 NOT DETECTED NOT DETECTED Final   Parainfluenza Virus 3 NOT DETECTED NOT DETECTED Final   Parainfluenza Virus 4 NOT DETECTED NOT DETECTED Final   Respiratory Syncytial Virus NOT DETECTED NOT DETECTED Final   Bordetella pertussis NOT DETECTED NOT DETECTED Final   Bordetella Parapertussis NOT DETECTED NOT DETECTED Final   Chlamydophila pneumoniae NOT DETECTED NOT DETECTED Final   Mycoplasma pneumoniae NOT DETECTED NOT DETECTED Final    Comment: Performed at [facility], [address] [place], [place] [address]         Radiology Studies: ECHOCARDIOGRAM COMPLETE Result Date: 03/11/2024    ECHOCARDIOGRAM REPORT   Patient Name:   RYLIN SEAVEY [facility] Date of Exam: 03/11/2024 Medical Rec #:  [id number]           Height:       66.0 in Accession #:    [id number]          Weight:        167.1 lb Date of Birth:  1971/08/22           BSA:          1.853 m Patient Age:    53 years            BP:           128/76 mmHg Patient Gender: M                   HR:           98 bpm. Exam Location:  Inpatient Procedure: 2D Echo, Cardiac Doppler and Color Doppler (Both Spectral and Color            Flow Doppler were utilized during procedure). Indications:    R06.02 SOB; I51.89 Diastolic dysfunction  History:  Patient has prior history of Echocardiogram examinations, most                 recent 04/27/2022. COPD; Risk Factors:Current Smoker. ETOH.  Sonographer:    Raynelle Callow RDCS Referring Phys: [id number] DAVID MANUEL ORTIZ  Sonographer Comments: Technically difficult study due to poor echo windows. Low parasternal window. IMPRESSIONS  1. Left ventricular ejection fraction, by estimation, is >75%. The left ventricle has hyperdynamic function. The left ventricle has no regional wall motion abnormalities. Left ventricular diastolic parameters are consistent with Grade I diastolic dysfunction (impaired relaxation).  2. Right ventricular systolic function is normal. The right ventricular size is normal.  3. The mitral valve is degenerative. No evidence of mitral valve regurgitation. No evidence of mitral stenosis.  4. The aortic valve is tricuspid. Aortic valve regurgitation is not visualized. No aortic stenosis is present. FINDINGS  Left Ventricle: Left ventricular ejection fraction, by estimation, is >75%. The left ventricle has hyperdynamic function. The left ventricle has no regional wall motion abnormalities. The left ventricular internal cavity size was normal in size. There is no left ventricular hypertrophy. Left ventricular diastolic parameters are consistent with Grade I diastolic dysfunction (impaired relaxation). Right Ventricle: The right ventricular size is normal. Right ventricular systolic function is normal. Left Atrium: Left atrial size was normal in size. Right Atrium: Right atrial size was normal  in size. Pericardium: There is no evidence of pericardial effusion. Mitral Valve: The mitral valve is degenerative in appearance. No evidence of mitral valve regurgitation. No evidence of mitral valve stenosis. Tricuspid Valve: The tricuspid valve is normal in structure. Tricuspid valve regurgitation is trivial. No evidence of tricuspid stenosis. Aortic Valve: The aortic valve is tricuspid. Aortic valve regurgitation is not visualized. No aortic stenosis is present. Pulmonic Valve: The pulmonic valve was not well visualized. Pulmonic valve regurgitation is not visualized. No evidence of pulmonic stenosis. Aorta: The aortic root and ascending aorta are structurally normal, with no evidence of dilitation. IAS/Shunts: No atrial level shunt detected by color flow Doppler.  LEFT VENTRICLE PLAX 2D LVIDd:         4.20 cm     Diastology LVIDs:         2.10 cm     LV e' medial:    7.29 cm/s LV PW:         1.00 cm     LV E/e' medial:  11.5 LV IVS:        1.10 cm     LV e' lateral:   11.40 cm/s LVOT diam:     2.00 cm     LV E/e' lateral: 7.3 LV SV:         86 LV SV Index:   46 LVOT Area:     3.14 cm  LV Volumes (MOD) LV vol d, MOD A2C: 51.2 ml LV vol d, MOD A4C: 35.9 ml LV vol s, MOD A2C: 14.9 ml LV vol s, MOD A4C: 9.1 ml LV SV MOD A2C:     36.3 ml LV SV MOD A4C:     35.9 ml LV SV MOD BP:      31.7 ml RIGHT VENTRICLE             IVC RV S prime:     11.10 cm/s  IVC diam: 1.80 cm TAPSE (M-mode): 1.7 cm LEFT ATRIUM             Index       RIGHT ATRIUM  Index LA diam:        2.30 cm 1.24 cm/m  RA Area:     11.80 cm LA Vol (A2C):   14.3 ml 7.72 ml/m  RA Volume:   27.80 ml  15.00 ml/m LA Vol (A4C):   13.8 ml 7.45 ml/m LA Biplane Vol: 14.3 ml 7.72 ml/m  AORTIC VALVE LVOT Vmax:   151.00 cm/s LVOT Vmean:  89.600 cm/s LVOT VTI:    0.273 m  AORTA Ao Root diam: 1.90 cm Ao Asc diam:  3.20 cm MITRAL VALVE MV Area (PHT): 3.91 cm    SHUNTS MV Decel Time: 194 msec    Systemic VTI:  0.27 m MV E velocity: 83.75 cm/s  Systemic  Diam: 2.00 cm MV A velocity: 98.75 cm/s MV E/A ratio:  0.85 Alexandria Angel MD Electronically signed by Alexandria Angel MD Signature Date/Time: 03/11/2024/3:14:45 PM    Final    DG Chest 2 View Result Date: 03/10/2024 CLINICAL DATA:  Cough and fevers EXAM: CHEST - 2 VIEW COMPARISON:  12/18/2023 FINDINGS: The heart size and mediastinal contours are within normal limits. Both lungs are clear. The visualized skeletal structures are unremarkable. IMPRESSION: No active cardiopulmonary disease. Electronically Signed   By: Violeta Grey M.D.   On: 03/10/2024 21:35        Scheduled Meds:  enoxaparin  (LOVENOX ) injection  40 mg Subcutaneous Q24H   ipratropium-albuterol   3 mL Nebulization TID   loratadine   10 mg Oral Daily   montelukast   10 mg Oral QHS   pantoprazole   40 mg Oral Daily   predniSONE   40 mg Oral Q breakfast   Continuous Infusions:  azithromycin  500 mg (03/11/24 2109)     LOS: 1 day   Time spent:  Haydee Lipa, DO [facility]  If 7PM-7AM, please contact night-coverage [email or web address]  03/12/2024, 8:10 AM

## 2024-03-12 NOTE — Plan of Care (Signed)
   Problem: Education: Goal: Knowledge of General Education information will improve Description: Including pain rating scale, medication(s)/side effects and non-pharmacologic comfort measures Outcome: Progressing   Problem: Clinical Measurements: Goal: Diagnostic test results will improve Outcome: Progressing   Problem: Activity: Goal: Risk for activity intolerance will decrease Outcome: Progressing

## 2024-03-13 DIAGNOSIS — I7090 Unspecified atherosclerosis: Secondary | ICD-10-CM | POA: Diagnosis not present

## 2024-03-13 DIAGNOSIS — K219 Gastro-esophageal reflux disease without esophagitis: Secondary | ICD-10-CM

## 2024-03-13 DIAGNOSIS — J441 Chronic obstructive pulmonary disease with (acute) exacerbation: Secondary | ICD-10-CM | POA: Diagnosis not present

## 2024-03-13 LAB — BASIC METABOLIC PANEL WITH GFR
Anion gap: 6 (ref 5–15)
BUN: 24 mg/dL — ABNORMAL HIGH (ref 6–20)
CO2: 27 mmol/L (ref 22–32)
Calcium: 9 mg/dL (ref 8.9–10.3)
Chloride: 106 mmol/L (ref 98–111)
Creatinine, Ser: 0.76 mg/dL (ref 0.61–1.24)
GFR, Estimated: >60 mL/min (ref >60)
Glucose, Bld: 99 mg/dL (ref 70–99)
Potassium: 4.5 mmol/L (ref 3.5–5.1)
Sodium: 139 mmol/L (ref 135–145)

## 2024-03-13 LAB — CBC
HCT: 41.6 % (ref 39.0–52.0)
Hemoglobin: 13.3 g/dL (ref 13.0–17.0)
MCH: 31.6 pg (ref 26.0–34.0)
MCHC: 32 g/dL (ref 30.0–36.0)
MCV: 98.8 fL (ref 80.0–100.0)
Platelets: 237 10*3/uL (ref 150–400)
RBC: 4.21 MIL/uL — ABNORMAL LOW (ref 4.22–5.81)
RDW: 14.7 % (ref 11.5–15.5)
WBC: 18.1 10*3/uL — ABNORMAL HIGH (ref 4.0–10.5)
nRBC: 0 % (ref 0.0–0.2)

## 2024-03-13 MED ORDER — IPRATROPIUM-ALBUTEROL 0.5-2.5 (3) MG/3ML IN SOLN
3.0000 mL | Freq: Four times a day (QID) | RESPIRATORY_TRACT | Status: DC
  Administered 2024-03-13 – 2024-03-14 (×4): 3 mL via RESPIRATORY_TRACT
  Filled 2024-03-13 (×3): qty 3

## 2024-03-13 MED ORDER — SALINE SPRAY 0.65 % NA SOLN: 1.0000 | NASAL | Status: DC | PRN

## 2024-03-13 MED ORDER — AZITHROMYCIN 250 MG PO TABS
500.0000 mg | ORAL_TABLET | Freq: Every day | ORAL | Status: AC
  Administered 2024-03-13 – 2024-03-14 (×2): 500 mg via ORAL
  Filled 2024-03-13 (×2): qty 2

## 2024-03-13 MED ORDER — BUDESONIDE 0.25 MG/2ML IN SUSP
0.2500 mg | Freq: Two times a day (BID) | RESPIRATORY_TRACT | Status: DC
  Administered 2024-03-13 – 2024-03-14 (×3): 0.25 mg via RESPIRATORY_TRACT
  Filled 2024-03-13 (×3): qty 2

## 2024-03-13 MED ORDER — FLUTICASONE PROPIONATE 50 MCG/ACT NA SUSP
2.0000 | Freq: Every day | NASAL | Status: DC
  Administered 2024-03-14: 2 via NASAL
  Filled 2024-03-13: qty 16

## 2024-03-13 MED ORDER — SODIUM CHLORIDE 3 % IN NEBU
4.0000 mL | INHALATION_SOLUTION | Freq: Two times a day (BID) | RESPIRATORY_TRACT | Status: DC
  Administered 2024-03-13 – 2024-03-14 (×3): 4 mL via RESPIRATORY_TRACT
  Filled 2024-03-13 (×4): qty 4

## 2024-03-13 NOTE — Plan of Care (Signed)
  Problem: Clinical Measurements: Goal: Respiratory complications will improve Outcome: Progressing   Problem: Nutrition: Goal: Adequate nutrition will be maintained Outcome: Progressing   Problem: Safety: Goal: Ability to remain free from injury will improve Outcome: Progressing   

## 2024-03-13 NOTE — Plan of Care (Signed)
   Problem: Clinical Measurements: Goal: Respiratory complications will improve Outcome: Progressing   Problem: Safety: Goal: Ability to remain free from injury will improve Outcome: Progressing

## 2024-03-13 NOTE — Progress Notes (Signed)
 [facility]  PROGRESS NOTE  Maxx Pham Merk ZOX:[id number] DOB: [birth year] DOA: 03/10/2024 PCP: Pcp, No   Brief HPI:   53 y.o. male with medical history significant of ADHD, atherosclerosis, palpitations, history of alcohol abuse in remission, tobacco use but not in the last 2 days, asthma, COPD, seasonal allergies, GERD, history of heart murmur as a child who presented to the emergency department complaints of fever and cough. Found to be profoundly hypoxic in the ED - hospitalist called for admission.     Assessment/Plan:   Acute hypoxic respiratory failure secondary to COPD exacerbation Acute metapneumovirus infection Viral panel positive for metapneumovirus Continue nebs/steroids/supportive care Flutter/incentive ongoing Azithromycin  x5 days Pred taper once clinically improving Scheduled and as needed bronchodilators. Wean oxygen as tolerated to room air - Will start hypertonic saline nebulizers twice a day, budesonide nebulizer twice daily   Gastroesophageal reflux disease Continue pantoprazole  40 mg p.o. daily.   Atherosclerosis, aorta, chronic Smoking cessation advised. Not on aspirin/statin per med rec    Medications     budesonide-glycopyrrolate-formoterol  2 puff Inhalation BID   cetirizine  10 mg Oral Daily   enoxaparin  (LOVENOX ) injection  40 mg Subcutaneous Q24H   guaiFENesin   600 mg Oral BID   ipratropium-albuterol   3 mL Nebulization TID   montelukast   10 mg Oral QHS   pantoprazole   40 mg Oral Daily   predniSONE   40 mg Oral Q breakfast     Data Reviewed:   CBG:  No results for input(s): "GLUCAP" in the last 168 hours.  SpO2: 95 % O2 Flow Rate (L/min): 2 L/min    Vitals:   03/13/24 0022 03/13/24 0400 03/13/24 0814 03/13/24 0817  BP:  133/85    Pulse:  82    Resp:  18    Temp:  97.7 F (36.5 C)    TempSrc:  Oral    SpO2: 95% 97% 95% 95%  Weight:      Height:          Data Reviewed:  Basic Metabolic Panel: Recent Labs  Lab  03/11/24 0120 03/12/24 0543 03/13/24 0605  NA 135 136 139  K 4.5 4.7 4.5  CL 98 104 106  CO2 26 25 27   GLUCOSE 113* 134* 99  BUN 20 20 24*  CREATININE 1.12 0.78 0.76  CALCIUM  8.9 8.7* 9.0    CBC: Recent Labs  Lab 03/11/24 0120 03/11/24 0837 03/12/24 0543 03/13/24 0605  WBC 9.8 8.1 16.9* 18.1*  NEUTROABS 6.6  --   --   --   HGB 14.2 14.2 14.1 13.3  HCT 42.5 43.8 43.4 41.6  MCV 94.7 97.8 98.0 98.8  PLT 233 225 237 237    LFT Recent Labs  Lab 03/12/24 0543  AST 24  ALT 32  ALKPHOS 54  BILITOT 0.7  PROT 7.0  ALBUMIN 3.4*     Antibiotics: Anti-infectives (From admission, onward)    Start     Dose/Rate Route Frequency Ordered Stop   03/11/24 2200  azithromycin  (ZITHROMAX ) 500 mg in sodium chloride  0.9 % 250 mL IVPB        500 mg 250 mL/hr over 60 Minutes Intravenous Every 24 hours 03/11/24 1645     03/11/24 0000  azithromycin  (ZITHROMAX ) tablet 500 mg        500 mg Oral  Once 03/10/24 2347 03/11/24 0026   03/10/24 0000  azithromycin  (ZITHROMAX ) 250 MG tablet        250 mg Oral Daily 03/10/24 2348  DVT prophylaxis: Lovenox   Code Status: Full code  Family Communication: No family at bedside   CONSULTS    Subjective   Still complains of shortness of breath   Objective    Physical Examination:   General-appears in no acute distress Heart-S1-S2, regular, no murmur auscultated Lungs-bilateral wheezing auscultated Abdomen-soft, nontender, no organomegaly Extremities-no edema in the lower extremities Neuro-alert, oriented x3, no focal deficit noted   Status is: Inpatient:             Ernesto Zukowski S Liora Myles   [facility] If 7PM-7AM, please contact night-coverage at [email or web address], Office  [phone]   03/13/2024, 8:27 AM  LOS: 2 days

## 2024-03-14 ENCOUNTER — Other Ambulatory Visit: Payer: Self-pay | Admitting: Nurse Practitioner

## 2024-03-14 DIAGNOSIS — J441 Chronic obstructive pulmonary disease with (acute) exacerbation: Secondary | ICD-10-CM

## 2024-03-14 DIAGNOSIS — J4540 Moderate persistent asthma, uncomplicated: Secondary | ICD-10-CM

## 2024-03-14 DIAGNOSIS — I7090 Unspecified atherosclerosis: Secondary | ICD-10-CM | POA: Diagnosis not present

## 2024-03-14 DIAGNOSIS — K219 Gastro-esophageal reflux disease without esophagitis: Secondary | ICD-10-CM | POA: Diagnosis not present

## 2024-03-14 MED ORDER — IPRATROPIUM-ALBUTEROL 0.5-2.5 (3) MG/3ML IN SOLN: 3.0000 mL | Freq: Two times a day (BID) | RESPIRATORY_TRACT | Status: DC

## 2024-03-14 MED ORDER — IPRATROPIUM-ALBUTEROL 0.5-2.5 (3) MG/3ML IN SOLN: 3.0000 mL | Freq: Four times a day (QID) | RESPIRATORY_TRACT | 1 refills | Status: AC | PRN

## 2024-03-14 MED ORDER — PREDNISONE 10 MG PO TABS
ORAL_TABLET | ORAL | 0 refills | Status: AC
Start: 2024-03-15 — End: ?

## 2024-03-14 MED ORDER — FLUTICASONE PROPIONATE 50 MCG/ACT NA SUSP: 1.0000 | Freq: Every day | NASAL | 2 refills | Status: AC

## 2024-03-14 MED ORDER — GUAIFENESIN ER 600 MG PO TB12
600.0000 mg | ORAL_TABLET | Freq: Two times a day (BID) | ORAL | 0 refills | Status: AC
Start: 2024-03-14 — End: 2024-03-24

## 2024-03-14 NOTE — Plan of Care (Signed)

## 2024-03-14 NOTE — Progress Notes (Incomplete)
 [facility]  PROGRESS NOTE  [name] Beebe [id number]:[id number] DOB: [birth year] DOA: 03/10/2024 PCP: Pcp, No   Brief HPI:   53 y.o. male with medical history significant of ADHD, atherosclerosis, palpitations, history of alcohol abuse in remission, tobacco use but not in the last 2 days, asthma, COPD, seasonal allergies, GERD, history of heart murmur as a child who presented to the emergency department complaints of fever and cough. Found to be profoundly hypoxic in the ED - hospitalist called for admission.     Assessment/Plan:   Acute hypoxic respiratory failure secondary to COPD exacerbation Acute metapneumovirus infection Viral panel positive for metapneumovirus Continue nebs/steroids/supportive care Flutter/incentive ongoing Azithromycin  x5 days Pred taper once clinically improving Scheduled and as needed bronchodilators. Wean oxygen as tolerated to room air - Will start hypertonic saline nebulizers twice a day, budesonide nebulizer twice daily   Gastroesophageal reflux disease Continue pantoprazole  40 mg p.o. daily.   Atherosclerosis, aorta, chronic Smoking cessation advised. Not on aspirin/statin per med rec    Medications     azithromycin   500 mg Oral Daily   budesonide (PULMICORT) nebulizer solution  0.25 mg Nebulization BID   enoxaparin  (LOVENOX ) injection  40 mg Subcutaneous Q24H   fluticasone   2 spray Each Nare Daily   guaiFENesin   600 mg Oral BID   ipratropium-albuterol   3 mL Nebulization Q6H   pantoprazole   40 mg Oral Daily   predniSONE   40 mg Oral Q breakfast   sodium chloride  HYPERTONIC  4 mL Nebulization BID     Data Reviewed:   CBG:  No results for input(s): "GLUCAP" in the last 168 hours.  SpO2: 99 % O2 Flow Rate (L/min): 2 L/min    Vitals:   03/13/24 2000 03/14/24 0408 03/14/24 0746 03/14/24 0802  BP: 139/76 127/77  121/85  Pulse: 85 61  87  Resp: 20 18    Temp: 97.6 F (36.4 C) (!) 97.5 F (36.4 C)  97.9 F (36.6 C)  TempSrc:  Oral Oral  Oral  SpO2: 94% 96% 95% 99%  Weight:      Height:          Data Reviewed:  Basic Metabolic Panel: Recent Labs  Lab 03/11/24 0120 03/12/24 0543 03/13/24 0605  NA 135 136 139  K 4.5 4.7 4.5  CL 98 104 106  CO2 26 25 27   GLUCOSE 113* 134* 99  BUN 20 20 24*  CREATININE 1.12 0.78 0.76  CALCIUM  8.9 8.7* 9.0    CBC: Recent Labs  Lab 03/11/24 0120 03/11/24 0837 03/12/24 0543 03/13/24 0605  WBC 9.8 8.1 16.9* 18.1*  NEUTROABS 6.6  --   --   --   HGB 14.2 14.2 14.1 13.3  HCT 42.5 43.8 43.4 41.6  MCV 94.7 97.8 98.0 98.8  PLT 233 225 237 237    LFT Recent Labs  Lab 03/12/24 0543  AST 24  ALT 32  ALKPHOS 54  BILITOT 0.7  PROT 7.0  ALBUMIN 3.4*     Antibiotics: Anti-infectives (From admission, onward)    Start     Dose/Rate Route Frequency Ordered Stop   03/13/24 1600  azithromycin  (ZITHROMAX ) tablet 500 mg        500 mg Oral Daily 03/13/24 0833 03/15/24 0959   03/11/24 2200  azithromycin  (ZITHROMAX ) 500 mg in sodium chloride  0.9 % 250 mL IVPB  Status:  Discontinued        500 mg 250 mL/hr over 60 Minutes Intravenous Every 24 hours 03/11/24 1645 03/13/24 5784  03/11/24 0000  azithromycin  (ZITHROMAX ) tablet 500 mg        500 mg Oral  Once 03/10/24 2347 03/11/24 0026   03/10/24 0000  azithromycin  (ZITHROMAX ) 250 MG tablet        250 mg Oral Daily 03/10/24 2348          DVT prophylaxis: Lovenox   Code Status: Full code  Family Communication: No family at bedside   CONSULTS    Subjective      Objective    Physical Examination:      Status is: Inpatient:             [name]   [facility] If 7PM-7AM, please contact night-coverage at [email or web address], Office  [phone]   03/14/2024, 8:07 AM  LOS: 3 days

## 2024-03-14 NOTE — Progress Notes (Signed)
   03/14/24 0910  TOC Brief Assessment  Insurance and Status Reviewed  Patient has primary care physician Yes  Home environment has been reviewed Apartment  Prior level of function: Independent  Prior/Current Home Services No current home services  Social Drivers of Health Review SDOH reviewed no interventions necessary  Readmission risk has been reviewed Yes  Transition of care needs transition of care needs identified, TOC will continue to follow

## 2024-03-14 NOTE — Progress Notes (Signed)
 SATURATION QUALIFICATIONS: (This note is used to comply with regulatory documentation for home oxygen)  Patient Saturations on Room Air at Rest = 97%  Patient Saturations on Room Air while Ambulating = 94%  Patient Saturations on 2 Liters of oxygen while Ambulating = 99%  Please briefly explain why patient needs home oxygen:

## 2024-03-14 NOTE — Discharge Summary (Signed)
 Physician Discharge Summary   Patient: [name] MRN: [id number] DOB: 02-19-1971  Admit date:     03/10/2024  Discharge date: 03/14/24  Discharge Physician: Ozell Blunt   PCP: Pcp, No   Recommendations at discharge:   Follow-up PCP as outpatient  Discharge Diagnoses: Principal Problem:   COPD with exacerbation (HCC) Active Problems:   Gastroesophageal reflux disease   Atherosclerosis  Resolved Problems:   * No resolved hospital problems. *  Hospital Course: 53 y.o. male with medical history significant of ADHD, atherosclerosis, palpitations, history of alcohol abuse in remission, tobacco use but not in the last 2 days, asthma, COPD, seasonal allergies, GERD, history of heart murmur as a child who presented to the emergency department complaints of fever and cough. Found to be profoundly hypoxic in the ED - hospitalist called for admission.   Assessment and Plan:  Acute hypoxic respiratory failure secondary to COPD exacerbation Acute metapneumovirus infection Viral panel positive for metapneumovirus -Treated with bronchodilators, steroids, budesonide, nebulizer treatments -Significantly improved, no longer requiring oxygen.  O2 sats 97% on room air on ambulation -Will discharge on prednisone  taper, Mucinex , Flonase     Gastroesophageal reflux disease Continue pantoprazole  40 mg p.o. daily.   Atherosclerosis, aorta, chronic Smoking cessation advised.         Consultants:  Procedures performed:  Disposition: Home Diet recommendation:  Discharge Diet Orders (From admission, onward)     Start     Ordered   03/14/24 0000  Diet - low sodium heart healthy        03/14/24 1203           Regular diet DISCHARGE MEDICATION: Allergies as of 03/14/2024       Reactions   Grass Pollen([id number] Vern) Anaphylaxis, Hives, Itching, Swelling, Other (See Comments)        Medication List     TAKE these medications    acetaminophen  500 MG  tablet Commonly known as: TYLENOL  Take 1,000 mg by mouth every 6 (six) hours as needed for fever or mild pain (pain score 1-3).   albuterol  108 (90 Base) MCG/ACT inhaler Commonly known as: VENTOLIN  HFA INHALE 2 PUFFS BY MOUTH EVERY 4 HOURS AS NEEDED FOR WHEEZE OR FOR SHORTNESS OF BREATH What changed:  See the new instructions. Another medication with the same name was removed. Continue taking this medication, and follow the directions you see here.   [facility] Olopatadine  HCl 0.2 % Soln Generic drug: Olopatadine  HCl Place 1 drop into both eyes 2 (two) times daily as needed (for allergies).   EPINEPHrine  0.3 mg/0.3 mL Soaj injection Commonly known as: EPI-PEN Inject 0.3 mg into the muscle as needed for anaphylaxis.   fluticasone  50 MCG/ACT nasal spray Commonly known as: FLONASE  Place 1 spray into both nostrils daily. Start taking on: Mar 15, 2024   glucosamine-chondroitin 500-400 MG tablet Take 2 tablets by mouth every evening.   guaiFENesin  600 MG 12 hr tablet Commonly known as: MUCINEX  Take 1 tablet (600 mg total) by mouth 2 (two) times daily for 10 days.   ipratropium-albuterol  0.5-2.5 (3) MG/3ML Soln Commonly known as: DUONEB Take 3 mLs by nebulization every 6 (six) hours as needed (shortness of breath).   levocetirizine 5 MG tablet Commonly known as: XYZAL  Take 1 tablet (5 mg total) by mouth every evening.   montelukast  10 MG tablet Commonly known as: SINGULAIR  TAKE 1 TABLET BY MOUTH EVERYDAY AT BEDTIME What changed: See the new instructions.   multivitamin with minerals Tabs tablet Take 1 tablet  by mouth every evening.   pantoprazole  40 MG tablet Commonly known as: PROTONIX  TAKE 1 TABLET BY MOUTH EVERY DAY What changed: when to take this   predniSONE  10 MG tablet Commonly known as: DELTASONE  Prednisone  40 mg po daily x 1 day then Prednisone  30 mg po daily x 1 day then Prednisone  20 mg po daily x 1 day then Prednisone  10 mg daily x 1 day then stop... Start taking  on: Mar 15, 2024   Trelegy Ellipta  200-62.5-25 MCG/ACT Aepb Generic drug: Fluticasone -Umeclidin-Vilant INHALE 1 PUFF BY MOUTH EVERY DAY What changed: See the new instructions.   UNKNOWN TO PATIENT Take 1 tablet by mouth See admin instructions. Unnamed OTC allergy  tablet- Take 1 tablet by mouth once a day as needed for allergies        Follow-up Information     Schedule an appointment as soon as possible for a visit  with [facility] with your PCP/Specialist as discussed.   Contact information: [email or web address] Call our physician referral line at [phone].               Discharge Exam: Filed Weights   03/10/24 2030 03/11/24 0622  Weight: 77.1 kg 75.8 kg   General-appears in no acute distress Heart-S1-S2, regular, no murmur auscultated Lungs-clear to auscultation bilaterally, no wheezing or crackles auscultated Abdomen-soft, nontender, no organomegaly Extremities-no edema in the lower extremities Neuro-alert, oriented x3, no focal deficit noted  Condition at discharge: good  The results of significant diagnostics from this hospitalization (including imaging, microbiology, ancillary and laboratory) are listed below for reference.   Imaging Studies: ECHOCARDIOGRAM COMPLETE Result Date: 03/11/2024    ECHOCARDIOGRAM REPORT   Patient Name:   [name] [facility] Date of Exam: 03/11/2024 Medical Rec #:  [id number]           Height:       66.0 in Accession #:    [id number]          Weight:       167.1 lb Date of Birth:  Jan 27, 1971           BSA:          1.853 m Patient Age:    53 years            BP:           128/76 mmHg Patient Gender: M                   HR:           98 bpm. Exam Location:  Inpatient Procedure: 2D Echo, Cardiac Doppler and Color Doppler (Both Spectral and Color            Flow Doppler were utilized during procedure). Indications:    R06.02 SOB; I51.89 Diastolic dysfunction  History:        Patient has prior history of Echocardiogram  examinations, most                 recent 04/27/2022. COPD; Risk Factors:Current Smoker. ETOH.  Sonographer:    Raynelle Callow RDCS Referring Phys: [id number] DAVID MANUEL ORTIZ  Sonographer Comments: Technically difficult study due to poor echo windows. Low parasternal window. IMPRESSIONS  1. Left ventricular ejection fraction, by estimation, is >75%. The left ventricle has hyperdynamic function. The left ventricle has no regional wall motion abnormalities. Left ventricular diastolic parameters are consistent with Grade I diastolic dysfunction (impaired relaxation).  2. Right ventricular systolic function is normal. The right ventricular size is normal.  3. The mitral valve is degenerative. No evidence of mitral valve regurgitation. No evidence of mitral stenosis.  4. The aortic valve is tricuspid. Aortic valve regurgitation is not visualized. No aortic stenosis is present. FINDINGS  Left Ventricle: Left ventricular ejection fraction, by estimation, is >75%. The left ventricle has hyperdynamic function. The left ventricle has no regional wall motion abnormalities. The left ventricular internal cavity size was normal in size. There is no left ventricular hypertrophy. Left ventricular diastolic parameters are consistent with Grade I diastolic dysfunction (impaired relaxation). Right Ventricle: The right ventricular size is normal. Right ventricular systolic function is normal. Left Atrium: Left atrial size was normal in size. Right Atrium: Right atrial size was normal in size. Pericardium: There is no evidence of pericardial effusion. Mitral Valve: The mitral valve is degenerative in appearance. No evidence of mitral valve regurgitation. No evidence of mitral valve stenosis. Tricuspid Valve: The tricuspid valve is normal in structure. Tricuspid valve regurgitation is trivial. No evidence of tricuspid stenosis. Aortic Valve: The aortic valve is tricuspid. Aortic valve regurgitation is not visualized. No aortic stenosis is  present. Pulmonic Valve: The pulmonic valve was not well visualized. Pulmonic valve regurgitation is not visualized. No evidence of pulmonic stenosis. Aorta: The aortic root and ascending aorta are structurally normal, with no evidence of dilitation. IAS/Shunts: No atrial level shunt detected by color flow Doppler.  LEFT VENTRICLE PLAX 2D LVIDd:         4.20 cm     Diastology LVIDs:         2.10 cm     LV e' medial:    7.29 cm/s LV PW:         1.00 cm     LV E/e' medial:  11.5 LV IVS:        1.10 cm     LV e' lateral:   11.40 cm/s LVOT diam:     2.00 cm     LV E/e' lateral: 7.3 LV SV:         86 LV SV Index:   46 LVOT Area:     3.14 cm  LV Volumes (MOD) LV vol d, MOD A2C: 51.2 ml LV vol d, MOD A4C: 35.9 ml LV vol s, MOD A2C: 14.9 ml LV vol s, MOD A4C: 9.1 ml LV SV MOD A2C:     36.3 ml LV SV MOD A4C:     35.9 ml LV SV MOD BP:      31.7 ml RIGHT VENTRICLE             IVC RV S prime:     11.10 cm/s  IVC diam: 1.80 cm TAPSE (M-mode): 1.7 cm LEFT ATRIUM             Index       RIGHT ATRIUM           Index LA diam:        2.30 cm 1.24 cm/m  RA Area:     11.80 cm LA Vol (A2C):   14.3 ml 7.72 ml/m  RA Volume:   27.80 ml  15.00 ml/m LA Vol (A4C):   13.8 ml 7.45 ml/m LA Biplane Vol: 14.3 ml 7.72 ml/m  AORTIC VALVE LVOT Vmax:   151.00 cm/s LVOT Vmean:  89.600 cm/s LVOT VTI:    0.273 m  AORTA Ao Root diam: 1.90 cm Ao Asc diam:  3.20 cm MITRAL VALVE MV Area (PHT): 3.91 cm    SHUNTS MV Decel Time: 194 msec  Systemic VTI:  0.27 m MV E velocity: 83.75 cm/s  Systemic Diam: 2.00 cm MV A velocity: 98.75 cm/s MV E/A ratio:  0.85 Alexandria Angel MD Electronically signed by Alexandria Angel MD Signature Date/Time: 03/11/2024/3:14:45 PM    Final    DG Chest 2 View Result Date: 03/10/2024 CLINICAL DATA:  Cough and fevers EXAM: CHEST - 2 VIEW COMPARISON:  12/18/2023 FINDINGS: The heart size and mediastinal contours are within normal limits. Both lungs are clear. The visualized skeletal structures are unremarkable. IMPRESSION: No  active cardiopulmonary disease. Electronically Signed   By: Violeta Grey M.D.   On: 03/10/2024 21:35    Microbiology: Results for orders placed or performed during the hospital encounter of 03/10/24  Resp panel by RT-PCR (RSV, Flu A&B, Covid) Anterior Nasal Swab     Status: None   Collection Time: 03/10/24  8:31 PM   Specimen: Anterior Nasal Swab  Result Value Ref Range Status   SARS Coronavirus 2 by RT PCR NEGATIVE NEGATIVE Final    Comment: (NOTE) SARS-CoV-2 target nucleic acids are NOT DETECTED.  The SARS-CoV-2 RNA is generally detectable in upper respiratory specimens during the acute phase of infection. The lowest concentration of SARS-CoV-2 viral copies this assay can detect is 138 copies/mL. A negative result does not preclude SARS-Cov-2 infection and should not be used as the sole basis for treatment or other patient management decisions. A negative result may occur with  improper specimen collection/handling, submission of specimen other than nasopharyngeal swab, presence of viral mutation(s) within the areas targeted by this assay, and inadequate number of viral copies(<138 copies/mL). A negative result must be combined with clinical observations, patient history, and epidemiological information. The expected result is Negative.  Fact Sheet for Patients:  BloggerCourse.com  Fact Sheet for Healthcare Providers:  SeriousBroker.it  This test is no t yet approved or cleared by the [place]  FDA and  has been authorized for detection and/or diagnosis of SARS-CoV-2 by FDA under an Emergency Use Authorization (EUA). This EUA will remain  in effect (meaning this test can be used) for the duration of the COVID-19 declaration under Section 564(b)(1) of the Act, 21 U.S.C.section 360bbb-3(b)(1), unless the authorization is terminated  or revoked sooner.       Influenza A by PCR NEGATIVE NEGATIVE Final   Influenza B by PCR  NEGATIVE NEGATIVE Final    Comment: (NOTE) The Xpert Xpress SARS-CoV-2/FLU/RSV plus assay is intended as an aid in the diagnosis of influenza from Nasopharyngeal swab specimens and should not be used as a sole basis for treatment. Nasal washings and aspirates are unacceptable for Xpert Xpress SARS-CoV-2/FLU/RSV testing.  Fact Sheet for Patients: BloggerCourse.com  Fact Sheet for Healthcare Providers: SeriousBroker.it  This test is not yet approved or cleared by the [place]  FDA and has been authorized for detection and/or diagnosis of SARS-CoV-2 by FDA under an Emergency Use Authorization (EUA). This EUA will remain in effect (meaning this test can be used) for the duration of the COVID-19 declaration under Section 564(b)(1) of the Act, 21 U.S.C. section 360bbb-3(b)(1), unless the authorization is terminated or revoked.     Resp Syncytial Virus by PCR NEGATIVE NEGATIVE Final    Comment: (NOTE) Fact Sheet for Patients: BloggerCourse.com  Fact Sheet for Healthcare Providers: SeriousBroker.it  This test is not yet approved or cleared by the [place]  FDA and has been authorized for detection and/or diagnosis of SARS-CoV-2 by FDA under an Emergency Use Authorization (EUA). This EUA will remain in effect (meaning this  test can be used) for the duration of the COVID-19 declaration under Section 564(b)(1) of the Act, 21 U.S.C. section 360bbb-3(b)(1), unless the authorization is terminated or revoked.  Performed at [facility], [address], [place], [place] [address]   Respiratory (~20 pathogens) panel by PCR     Status: Abnormal   Collection Time: 03/11/24  2:37 PM   Specimen: Nasopharyngeal Swab; Respiratory  Result Value Ref Range Status   Adenovirus NOT DETECTED NOT DETECTED Final   Coronavirus 229E NOT DETECTED NOT DETECTED Final    Comment:  (NOTE) The Coronavirus on the Respiratory Panel, DOES NOT test for the novel  Coronavirus (2019 nCoV)    Coronavirus HKU1 NOT DETECTED NOT DETECTED Final   Coronavirus NL63 NOT DETECTED NOT DETECTED Final   Coronavirus OC43 NOT DETECTED NOT DETECTED Final   Metapneumovirus DETECTED (A) NOT DETECTED Final   Rhinovirus / Enterovirus NOT DETECTED NOT DETECTED Final   Influenza A NOT DETECTED NOT DETECTED Final   Influenza B NOT DETECTED NOT DETECTED Final   Parainfluenza Virus 1 NOT DETECTED NOT DETECTED Final   Parainfluenza Virus 2 NOT DETECTED NOT DETECTED Final   Parainfluenza Virus 3 NOT DETECTED NOT DETECTED Final   Parainfluenza Virus 4 NOT DETECTED NOT DETECTED Final   Respiratory Syncytial Virus NOT DETECTED NOT DETECTED Final   Bordetella pertussis NOT DETECTED NOT DETECTED Final   Bordetella Parapertussis NOT DETECTED NOT DETECTED Final   Chlamydophila pneumoniae NOT DETECTED NOT DETECTED Final   Mycoplasma pneumoniae NOT DETECTED NOT DETECTED Final    Comment: Performed at [facility], [address], [place], [place] [address]    Labs: CBC: Recent Labs  Lab 03/11/24 0120 03/11/24 0837 03/12/24 0543 03/13/24 0605  WBC 9.8 8.1 16.9* 18.1*  NEUTROABS 6.6  --   --   --   HGB 14.2 14.2 14.1 13.3  HCT 42.5 43.8 43.4 41.6  MCV 94.7 97.8 98.0 98.8  PLT 233 225 237 237   Basic Metabolic Panel: Recent Labs  Lab 03/11/24 0120 03/12/24 0543 03/13/24 0605  NA 135 136 139  K 4.5 4.7 4.5  CL 98 104 106  CO2 26 25 27   GLUCOSE 113* 134* 99  BUN 20 20 24*  CREATININE 1.12 0.78 0.76  CALCIUM  8.9 8.7* 9.0   Liver Function Tests: Recent Labs  Lab 03/12/24 0543  AST 24  ALT 32  ALKPHOS 54  BILITOT 0.7  PROT 7.0  ALBUMIN 3.4*   CBG: No results for input(s): "GLUCAP" in the last 168 hours.  Discharge time spent: greater than 30 minutes.  Signed: Ozell Blunt, MD [facility] 03/14/2024

## 2024-03-27 ENCOUNTER — Ambulatory Visit (INDEPENDENT_AMBULATORY_CARE_PROVIDER_SITE_OTHER): Payer: Self-pay

## 2024-03-27 DIAGNOSIS — J309 Allergic rhinitis, unspecified: Secondary | ICD-10-CM | POA: Diagnosis not present

## 2024-04-04 ENCOUNTER — Other Ambulatory Visit: Payer: Self-pay | Admitting: Pulmonary Disease

## 2024-04-04 DIAGNOSIS — J4489 Other specified chronic obstructive pulmonary disease: Secondary | ICD-10-CM

## 2024-04-10 DIAGNOSIS — J441 Chronic obstructive pulmonary disease with (acute) exacerbation: Secondary | ICD-10-CM | POA: Diagnosis not present

## 2024-04-24 ENCOUNTER — Ambulatory Visit (INDEPENDENT_AMBULATORY_CARE_PROVIDER_SITE_OTHER)

## 2024-04-24 DIAGNOSIS — J309 Allergic rhinitis, unspecified: Secondary | ICD-10-CM | POA: Diagnosis not present

## 2024-04-24 NOTE — Telephone Encounter (Signed)
 Authorization is pending Ins Approval      Copied from CRM [id number]. Topic: Referral - Status >> Apr 24, 2024 12:04 PM Fredrica W wrote: Reason for CRM: Consuelo LIGHTER called from [facility] at [phone]. Patient has upcoming appt. Checking for pre authorization Needs completed by tomorrow at 10 or will have to cancel appt. Thank You

## 2024-04-25 ENCOUNTER — Telehealth: Payer: Self-pay | Admitting: *Deleted

## 2024-04-25 NOTE — Telephone Encounter (Signed)
 Copied from CRM [id number]. Topic: Referral - Status >> Apr 24, 2024 12:04 PM Fredrica W wrote: Reason for CRM: Consuelo LIGHTER called from [facility] at [phone]. Patient has upcoming appt. Checking for pre authorization Needs completed by tomorrow at 10 or will have to cancel appt. Thank You >> Apr 25, 2024 10:47 AM Zane F wrote:

## 2024-04-25 NOTE — Telephone Encounter (Signed)
 Copied from CRM [id number]. Topic: Referral - Status >> Apr 24, 2024 12:04 PM Fredrica W wrote: Reason for CRM: Consuelo LIGHTER called from [facility] at [phone]. Patient has upcoming appt. Checking for pre authorization Needs completed by tomorrow at 10 or will have to cancel appt. Thank You >> Apr 25, 2024  2:19 PM Miquel SAILOR wrote: CT CHEST LCS NODULE F/U LOW DOSE WO CONTRAST (Order [id number])   Consuelo from [facility] callin gon update imaging order. Called and transferred to office due to app is for 07/07 >> Apr 25, 2024 10:47 AM Zane F wrote:

## 2024-04-29 ENCOUNTER — Ambulatory Visit
Admission: RE | Admit: 2024-04-29 | Discharge: 2024-04-29 | Disposition: A | Discharge status: Home / Self Care | Source: Ambulatory Visit | Attending: Internal Medicine | Admitting: Internal Medicine

## 2024-04-29 ENCOUNTER — Inpatient Hospital Stay: Admission: RE | Admit: 2024-04-29 | Source: Ambulatory Visit

## 2024-04-29 DIAGNOSIS — I2510 Atherosclerotic heart disease of native coronary artery without angina pectoris: Secondary | ICD-10-CM | POA: Diagnosis not present

## 2024-04-29 DIAGNOSIS — Z122 Encounter for screening for malignant neoplasm of respiratory organs: Secondary | ICD-10-CM

## 2024-04-29 DIAGNOSIS — J439 Emphysema, unspecified: Secondary | ICD-10-CM | POA: Diagnosis not present

## 2024-04-29 DIAGNOSIS — I700 Atherosclerosis of aorta: Secondary | ICD-10-CM | POA: Diagnosis not present

## 2024-04-29 DIAGNOSIS — R918 Other nonspecific abnormal finding of lung field: Secondary | ICD-10-CM | POA: Diagnosis not present

## 2024-04-29 DIAGNOSIS — Z87891 Personal history of nicotine dependence: Secondary | ICD-10-CM

## 2024-05-04 ENCOUNTER — Other Ambulatory Visit: Payer: Self-pay | Admitting: Nurse Practitioner

## 2024-05-04 DIAGNOSIS — K219 Gastro-esophageal reflux disease without esophagitis: Secondary | ICD-10-CM

## 2024-05-24 ENCOUNTER — Ambulatory Visit

## 2024-05-24 DIAGNOSIS — J309 Allergic rhinitis, unspecified: Secondary | ICD-10-CM | POA: Diagnosis not present

## 2024-05-27 ENCOUNTER — Encounter: Payer: Self-pay | Admitting: Acute Care

## 2024-06-08 ENCOUNTER — Other Ambulatory Visit: Payer: Self-pay | Admitting: Nurse Practitioner

## 2024-06-08 DIAGNOSIS — J4540 Moderate persistent asthma, uncomplicated: Secondary | ICD-10-CM

## 2024-06-08 DIAGNOSIS — J441 Chronic obstructive pulmonary disease with (acute) exacerbation: Secondary | ICD-10-CM

## 2024-06-19 ENCOUNTER — Ambulatory Visit (INDEPENDENT_AMBULATORY_CARE_PROVIDER_SITE_OTHER)

## 2024-06-19 DIAGNOSIS — J309 Allergic rhinitis, unspecified: Secondary | ICD-10-CM

## 2024-06-19 MED ORDER — EPINEPHRINE 0.3 MG/0.3ML IJ SOAJ: 0.3000 mg | INTRAMUSCULAR | 1 refills | Status: AC | PRN

## 2024-07-18 DIAGNOSIS — L989 Disorder of the skin and subcutaneous tissue, unspecified: Secondary | ICD-10-CM | POA: Diagnosis not present

## 2024-07-18 DIAGNOSIS — L02211 Cutaneous abscess of abdominal wall: Secondary | ICD-10-CM | POA: Diagnosis not present

## 2024-07-22 ENCOUNTER — Ambulatory Visit (INDEPENDENT_AMBULATORY_CARE_PROVIDER_SITE_OTHER)

## 2024-07-22 DIAGNOSIS — J309 Allergic rhinitis, unspecified: Secondary | ICD-10-CM

## 2024-07-24 DIAGNOSIS — R300 Dysuria: Secondary | ICD-10-CM | POA: Diagnosis not present

## 2024-07-31 ENCOUNTER — Encounter: Payer: Self-pay | Admitting: Pulmonary Disease

## 2024-07-31 ENCOUNTER — Other Ambulatory Visit: Payer: Self-pay | Admitting: *Deleted

## 2024-07-31 ENCOUNTER — Ambulatory Visit: Admitting: Pulmonary Disease

## 2024-07-31 ENCOUNTER — Encounter: Payer: Self-pay | Admitting: *Deleted

## 2024-07-31 VITALS — BP 136/88 | HR 73 | Temp 98.3°F | Ht 66.0 in | Wt 166.0 lb

## 2024-07-31 DIAGNOSIS — J302 Other seasonal allergic rhinitis: Secondary | ICD-10-CM | POA: Diagnosis not present

## 2024-07-31 DIAGNOSIS — J4540 Moderate persistent asthma, uncomplicated: Secondary | ICD-10-CM

## 2024-07-31 DIAGNOSIS — J4489 Other specified chronic obstructive pulmonary disease: Secondary | ICD-10-CM

## 2024-07-31 DIAGNOSIS — J309 Allergic rhinitis, unspecified: Secondary | ICD-10-CM

## 2024-07-31 DIAGNOSIS — F17210 Nicotine dependence, cigarettes, uncomplicated: Secondary | ICD-10-CM

## 2024-07-31 DIAGNOSIS — J3089 Other allergic rhinitis: Secondary | ICD-10-CM | POA: Diagnosis not present

## 2024-07-31 DIAGNOSIS — Z87891 Personal history of nicotine dependence: Secondary | ICD-10-CM

## 2024-07-31 DIAGNOSIS — J301 Allergic rhinitis due to pollen: Secondary | ICD-10-CM | POA: Diagnosis not present

## 2024-07-31 DIAGNOSIS — R918 Other nonspecific abnormal finding of lung field: Secondary | ICD-10-CM

## 2024-07-31 DIAGNOSIS — Z122 Encounter for screening for malignant neoplasm of respiratory organs: Secondary | ICD-10-CM

## 2024-07-31 LAB — CBC WITH DIFFERENTIAL/PLATELET
Basophils Absolute: 0 K/uL (ref 0.0–0.1)
Basophils Relative: 0.4 % (ref 0.0–3.0)
Eosinophils Absolute: 0.1 K/uL (ref 0.0–0.7)
Eosinophils Relative: 1.5 % (ref 0.0–5.0)
HCT: 45 % (ref 39.0–52.0)
Hemoglobin: 15.1 g/dL (ref 13.0–17.0)
Lymphocytes Relative: 32.8 % (ref 12.0–46.0)
Lymphs Abs: 2.4 K/uL (ref 0.7–4.0)
MCHC: 33.5 g/dL (ref 30.0–36.0)
MCV: 94 fl (ref 78.0–100.0)
Monocytes Absolute: 0.6 K/uL (ref 0.1–1.0)
Monocytes Relative: 8.6 % (ref 3.0–12.0)
Neutro Abs: 4.1 K/uL (ref 1.4–7.7)
Neutrophils Relative %: 56.7 % (ref 43.0–77.0)
Platelets: 343 K/uL (ref 150.0–400.0)
RBC: 4.79 Mil/uL (ref 4.22–5.81)
RDW: 14.5 % (ref 11.5–15.5)
WBC: 7.3 K/uL (ref 4.0–10.5)

## 2024-07-31 LAB — POCT EXHALED NITRIC OXIDE: FeNO level (ppb): 14

## 2024-07-31 MED ORDER — AIRSUPRA 90-80 MCG/ACT IN AERO: 2.0000 | INHALATION_SPRAY | RESPIRATORY_TRACT | 3 refills | Status: AC | PRN

## 2024-07-31 NOTE — Patient Instructions (Signed)
  VISIT SUMMARY: Today, we discussed your asthma and COPD management, recent exacerbations, and smoking cessation. We also reviewed your allergic rhinitis treatment and current medications.  YOUR PLAN: ASTHMA AND COPD: You have had two exacerbations this year that required hospitalization. Your current medications include Singulair , Trelegy, and Albuterol . -Replace your Albuterol  inhaler with Avsupra for rescue use. -Continue taking Trelegy and Singulair  daily. -We will order a FENO test to guide your treatment. -We will obtain blood tests for eosinophil counts and IgE levels. -We will start the paperwork for Nucala injection therapy.  ALLERGIC RHINITIS: Your allergic rhinitis is being managed with monthly allergy  shots, which have been effective. -Continue your monthly allergy  shots.  TOBACCO USE: You resumed smoking last November after quitting in 2017 and want to quit again. -Discuss smoking cessation options with your primary care provider when you are ready to quit. -Try to quit smoking on your own first.

## 2024-07-31 NOTE — Progress Notes (Signed)
 [name]    [id number]    [birth year]  Primary Care Physician:Skakle, Massie DO  Referring Physician: Valentin Massie, DO [address] [place],  [place] [address]  Chief complaint: Follow-up for COPD, asthma  HPI: 53 y.o.  with past medical history of COPD, asthma, ADHD. He has a history of childhood asthma.   Complains of bronchitis with increasing cough, congestion, sputum for the past 2 months.  He had been given several rounds of prednisone .  Started on [place] generic inhaler 2 months ago. States that he is somewhat better but still continues to have symptoms of congestion, dyspnea on exertion, cough with white mucus. Has significant history of allergies, sinus disease and GERD  He has started receiving [facility] [place] [facility] September 2024   Interim history: Discussed the use of AI scribe software for clinical note transcription with the patient, who gave verbal consent to proceed.  History of Present Illness TRYONE KILLE Woods is a 53 year old male with asthma and COPD who presents for follow-up after two hospitalizations this year.  Asthma and chronic obstructive pulmonary disease exacerbations - Two exacerbations this year requiring hospitalization - First exacerbation triggered by influenza requiring hospitalization in February 2025 - Second exacerbation triggered by Metapneumovirus infection, with hospitalization in May 2025 lasting approximately one week - Currently stable respiratory status - Previous use of prednisone , possibly after hospital stay  Respiratory symptoms and triggers - Breathing currently stable - Recent episode of coughing, possibly triggered by exposure to perfume - Increased Albuterol  use during humid weather - Uses Albuterol  less frequently than recommended, especially the nebulizer - Has both Albuterol  inhaler and nebulizer  Allergic rhinitis and immunotherapy -  Continues monthly allergy  shots - Allergy  shots effective in reducing sneezing and other allergy  symptoms  Tobacco use - Resumed smoking in November of last year after quitting in 2017 - Difficulty quitting smoking again - Expresses desire to stop smoking  Current respiratory medications - Currently taking Singulair , Trelegy, and Albuterol    Relevant pulmonary history Pets: Has a dog, no cats Occupation: Works as a Production designer, 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  No mold, hot tub, Jacuzzi, humidifier Smoking history: 30-pack-year smoker.  Quit in 2017.  Resumed smoking in November 2024 Travel history: No significant travel Relevant family history: Family history of emphysema.  Outpatient Encounter Medications as of 07/31/2024  Medication Sig   acetaminophen  (TYLENOL ) 500 MG tablet Take 1,000 mg by mouth every 6 (six) hours as needed for fever or mild pain (pain score 1-3).   albuterol  (VENTOLIN  HFA) 108 (90 Base) MCG/ACT inhaler INHALE 2 PUFFS BY MOUTH EVERY 4 HOURS AS NEEDED FOR WHEEZE OR FOR SHORTNESS OF BREATH   [employer] OLOPATADINE  HCL 0.2 % SOLN Place 1 drop into both eyes 2 (two) times daily as needed (for allergies).   EPINEPHrine  0.3 mg/0.3 mL IJ SOAJ injection Inject 0.3 mg into the muscle as needed for anaphylaxis.   fluticasone  (FLONASE ) 50 MCG/ACT nasal spray Place 1 spray into both nostrils daily.   glucosamine-chondroitin 500-400 MG tablet Take 2 tablets by mouth every evening.   ipratropium-albuterol  (DUONEB) 0.5-2.5 (3) MG/3ML SOLN Take 3 mLs by nebulization every 6 (six) hours as needed (shortness of breath).   levocetirizine (XYZAL ) 5 MG tablet Take 1 tablet (5 mg total) by mouth every evening.   montelukast  (SINGULAIR )  10 MG tablet TAKE 1 TABLET BY MOUTH EVERYDAY AT BEDTIME   Multiple Vitamin (MULTIVITAMIN WITH MINERALS) TABS tablet Take 1 tablet by mouth every evening.    pantoprazole  (PROTONIX ) 40 MG tablet TAKE 1 TABLET BY MOUTH EVERY DAY   TRELEGY ELLIPTA  200-62.5-25 MCG/ACT AEPB INHALE 1 PUFF BY MOUTH EVERY DAY   UNKNOWN TO PATIENT Take 1 tablet by mouth See admin instructions. Unnamed OTC allergy  tablet- Take 1 tablet by mouth once a day as needed for allergies   predniSONE  (DELTASONE ) 10 MG tablet Prednisone  40 mg po daily x 1 day then Prednisone  30 mg po daily x 1 day then Prednisone  20 mg po daily x 1 day then Prednisone  10 mg daily x 1 day then stop... (Patient not taking: Reported on 07/31/2024)   Facility-Administered Encounter Medications as of 07/31/2024  Medication   EPINEPHrine  (EPI-PEN) injection 0.3 mg   Vitals:   07/31/24 0911  BP: 136/88  Pulse: 73  Temp: 98.3 F (36.8 C)  Height: 5' 6 (1.676 m)  Weight: 166 lb (75.3 kg)  SpO2: 94%  TempSrc: Oral  BMI (Calculated): 26.81     Physical Exam GEN: No acute distress CV: Regular rate and rhythm no murmurs LUNGS: Clear to auscultation bilaterally normal respiratory effort SKIN JOINTS: Warm and dry no rash    Data Reviewed: Imaging: Chest x-ray 09/22/2018- clear lungs with no airspace consolidation or effusion.  No active cardiopulmonary disease. CT lung cancer screening 10/09/2022-stable pulmonary nodule, mild emphysema. CT lung cancer screening 04/29/2024-centrilobular and paraseptal emphysema.  Calcified granulomas and stable lung nodule I had reviewed the images personally.  PFTs: 12/05/2018 FVC 3.43 [78%), FEV1 1.77 [52%], F/F 52, TLC 116, DLCO 82% Severe obstruction with air trapping and bronchodilator response  ACQ score 12/05/2018- 2.86 ACT score 07/31/2024-17  FENO  12/05/2018-32 07/31/2024-14  Labs: CBC 09/22/2018-WBC 8.9, eos 3%, absolute eosinophil count 267 CBC 11/19/2018-WBC 12.2, eos 1.8%, absolute eosinophil count 220 IgE 11/19/2018- 56  Assessment & Plan Asthma and chronic obstructive pulmonary disease (COPD) with recent exacerbations Asthma and COPD with two  exacerbations this year requiring hospitalization and steroid treatment. Exacerbations were triggered by flu and metapneumovirus infections. Current medications include Singulair , Trelegy, and albuterol . Albuterol  inhaler to be replaced with Avsupra, which contains albuterol  and a steroid, for better control. Consideration of adding [place]re  nebulizer or biologic injection therapy (Dupixent or Nucala) due to frequent exacerbations. Nucala may be suitable based on eosinophil count which has ranged between 200-300. Injection therapy may be more beneficial due to the allergic inflammation component of asthma.  Low levels of FeNO today indicate control airway inflammation - Replace albuterol  inhaler with Avsupra for rescue use. - Continue Trelegy and Singulair  daily. - Obtain blood tests for eosinophil counts and IgE levels. - Initiate paperwork for Nucala injection therapy.  Allergic rhinitis on immunotherapy Allergic rhinitis managed with monthly allergy  shots, which have been effective in reducing symptoms compared to last year. - Continue monthly allergy  shots.  Tobacco use disorder Resumed smoking in November last year after quitting in 2017. Expresses desire to quit smoking without pharmacological aid initially. Discussed options for smoking cessation including Chantix and nicotine replacement therapy, but he prefers to attempt quitting independently first.  Time spent counseling-5 minutes.  Reassess at return visit. - Discuss smoking cessation options with primary care provider if ready to quit. - Encourage attempt to quit smoking independently.  Lung Nodules Stable on previous screening CT. Next screening CT scheduled for 10/23/2023. -Continue with scheduled CT scan.  Plan/Recommendations:  Trelegy Start paperwork for [employer] CBC differential, IgE Continue allergy  shots Smoking cessation Annual low-dose screening CT chest.  I personally spent a total of 32 minutes in the care of  the patient today including preparing to see the patient, getting/reviewing separately obtained history, performing a medically appropriate exam/evaluation, counseling and educating, and placing orders.   Marelly Wehrman MD [facility] 07/31/2024, 9:15 AM  CC: Valentin Skates, DO  Cardiac

## 2024-07-31 NOTE — Progress Notes (Signed)
 VIALS MADE 07-31-24

## 2024-08-01 LAB — IGE: IgE (Immunoglobulin E), Serum: 200 kU/L — ABNORMAL HIGH (ref <=114)

## 2024-08-02 ENCOUNTER — Other Ambulatory Visit (HOSPITAL_COMMUNITY): Payer: Self-pay

## 2024-08-02 ENCOUNTER — Telehealth: Payer: Self-pay

## 2024-08-02 NOTE — Telephone Encounter (Signed)
 Pt enrolled in [place] copay card:  BIN: [id number] PCN: [id number] Group: [id number] ID: [id number]  Phone #: [phone]

## 2024-08-02 NOTE — Telephone Encounter (Signed)
 Spoke to patient - scheduled for new start Nucala on 08/07/24 at 3PM. Using sample.   He asks about use of Airsupra versus Trelegy. Reviewed purpose of each - use Airsupra PRN and Trelegy daily as maintenance.   Patient verbalizes understanding and agreement with plan.  Aleck Puls, PharmD, BCPS, CPP Clinical Pharmacist  [facility]

## 2024-08-02 NOTE — Telephone Encounter (Signed)
 Submitted a Prior Authorization request to [employer] for NUCALA via CoverMyMeds. Authorization has been APPROVED from 07/03/24 to 01/29/25. Approval letter sent to scan center.   Unable to run test claim because pt must fill through [facility] [phone].  Authorization #: [id number] Phone #: [phone]  Will send message to pt to enroll in Nucala copay card.

## 2024-08-07 ENCOUNTER — Other Ambulatory Visit: Payer: Self-pay | Admitting: Pulmonary Disease

## 2024-08-07 ENCOUNTER — Ambulatory Visit

## 2024-08-07 DIAGNOSIS — J4489 Other specified chronic obstructive pulmonary disease: Secondary | ICD-10-CM | POA: Diagnosis not present

## 2024-08-07 DIAGNOSIS — F17210 Nicotine dependence, cigarettes, uncomplicated: Secondary | ICD-10-CM

## 2024-08-07 DIAGNOSIS — Z7189 Other specified counseling: Secondary | ICD-10-CM

## 2024-08-07 DIAGNOSIS — J441 Chronic obstructive pulmonary disease with (acute) exacerbation: Secondary | ICD-10-CM

## 2024-08-07 MED ORDER — NUCALA 100 MG/ML ~~LOC~~ SOAJ: 100.0000 mg | SUBCUTANEOUS | 3 refills | Status: DC

## 2024-08-07 NOTE — Progress Notes (Signed)
 HPI Patient presents today to [facility] to see pharmacy team for [facility] new start.  Past medical history includes COPD, asthma, and tobacco use. Last seen by Dr. Theophilus on 07/31/24. At that time, he was prescribed Airsupra for rescue inhaler, counseled on tobacco cessation, and plan to start biologic. History of frequent courses of steroid use.   Tobacco use reviewed - Quit for 10 years at a time, restarted smoking 1y ago. Past successful quit attempt was in the setting of being sick and by delaying urges to smoke a cigarette. Currently smoking 10-12 cigarettes/day. Girlfriend smokes, maybe interested in quitting as well. Past quit aids include use of nicotine patches but he didn't notice that it helped. Was smoking with patch on. Has not tried other quit aids. Denies withdrawal symptoms when he is without cigarettes, feels he continues to smoke out of habit.   Respiratory Medications Current regimen: Trelegy 200-62.5-25 mcg/act (Inhale 1 puff by mouth every day), Singulair  10mg  tab (Take 1 tab by mouth daily), Airsupra 90-80 mcg/act (Inhale 2 puffs into the lungs every 4 (four) hours as needed)  Patient reports no known adherence challenges  OBJECTIVE Allergies  Allergen Reactions   Grass Pollen(K-O-R-T-Swt Vern) Anaphylaxis, Hives, Itching, Swelling and Other (See Comments)    Outpatient Encounter Medications as of 08/07/2024  Medication Sig   acetaminophen  (TYLENOL ) 500 MG tablet Take 1,000 mg by mouth every 6 (six) hours as needed for fever or mild pain (pain score 1-3).   Albuterol -Budesonide  (AIRSUPRA) 90-80 MCG/ACT AERO Inhale 2 puffs into the lungs every 4 (four) hours as needed.   [facility] OLOPATADINE  HCL 0.2 % SOLN Place 1 drop into both eyes 2 (two) times daily as needed (for allergies).   EPINEPHrine  0.3 mg/0.3 mL IJ SOAJ injection Inject 0.3 mg into the muscle as needed for anaphylaxis.   fluticasone  (FLONASE ) 50 MCG/ACT nasal spray Place 1 spray into both nostrils daily.    glucosamine-chondroitin 500-400 MG tablet Take 2 tablets by mouth every evening.   ipratropium-albuterol  (DUONEB) 0.5-2.5 (3) MG/3ML SOLN Take 3 mLs by nebulization every 6 (six) hours as needed (shortness of breath).   levocetirizine (XYZAL ) 5 MG tablet Take 1 tablet (5 mg total) by mouth every evening.   montelukast  (SINGULAIR ) 10 MG tablet TAKE 1 TABLET BY MOUTH EVERYDAY AT BEDTIME   Multiple Vitamin (MULTIVITAMIN WITH MINERALS) TABS tablet Take 1 tablet by mouth every evening.   pantoprazole  (PROTONIX ) 40 MG tablet TAKE 1 TABLET BY MOUTH EVERY DAY   predniSONE  (DELTASONE ) 10 MG tablet Prednisone  40 mg po daily x 1 day then Prednisone  30 mg po daily x 1 day then Prednisone  20 mg po daily x 1 day then Prednisone  10 mg daily x 1 day then stop... (Patient not taking: Reported on 07/31/2024)   TRELEGY ELLIPTA  200-62.5-25 MCG/ACT AEPB INHALE 1 PUFF BY MOUTH EVERY DAY   UNKNOWN TO PATIENT Take 1 tablet by mouth See admin instructions. Unnamed OTC allergy  tablet- Take 1 tablet by mouth once a day as needed for allergies   Facility-Administered Encounter Medications as of 08/07/2024  Medication   EPINEPHrine  (EPI-PEN) injection 0.3 mg     Immunization History  Administered Date(s) Administered   Influenza, Mdck, Trivalent,PF 6+ MOS(egg free) 07/31/2024   Influenza,inj,Quad PF,6+ Mos 07/05/2017, 09/01/2018   PFIZER(Purple Top)SARS-COV-2 Vaccination 12/21/2019, 01/11/2020   PNEUMOCOCCAL CONJUGATE-20 12/25/2023   Tdap 01/13/2021     PFTs    Latest Ref Rng & Units 12/05/2018   11:42 AM  PFT Results  FVC-Pre  L 3.10   FVC-Predicted Pre % 71   FVC-Post L 3.43   FVC-Predicted Post % 78   Pre FEV1/FVC % % 51   Post FEV1/FCV % % 52   FEV1-Pre L 1.57   FEV1-Predicted Pre % 46   FEV1-Post L 1.77   DLCO uncorrected ml/min/mmHg 21.01   DLCO UNC% % 82   DLCO corrected ml/min/mmHg 21.25   DLCO COR %Predicted % 82   DLVA Predicted % 83   TLC L 7.04   TLC % Predicted % 116   RV % Predicted %  206      Eosinophils Most recent blood eosinophil count was 100 cells/microL taken on 07/31/24.   IgE: 200 on 07/31/24   Assessment   Biologics training for mepolizumab (Nucala)  Goals of therapy: Mechanism of Action: Not fully understood. It does act an interleukin-5 (IL-5) antagonist monoclonal antibody that reduces the production and survival of eosinophils by blocking the binding of IL-5 to the alpha chain of the receptor complex on the eosinophil cell surface. Reviewed that Nucala is add-on medication and patient must continue maintenance inhaler regimen. Response to therapy: may take 3 months to 6 months to determine efficacy. Discussed that patients generally feel improvement sooner than 3 months.  Side effects: headache (19%), injection site reaction (7-15%), antibody development (6%), backache (5%), fatigue (5%)  Dose: 100 mg subcutaneously every 4 weeks  Administration/Storage:  Reviewed administration sites of thigh or abdomen (at least 2-3 inches away from abdomen). Reviewed the upper arm is only appropriate if caregiver is administering injection  Do not shake the reconstituted solution as this could lead to product foaming or precipitation. Solution should be clear to opalescent and colorless to pale yellow or pale brown, essentially particle free. Small air bubbles, however, are expected and acceptable. If particulate matter remains in the solution or if the solution appears cloudy or milky, discard the solution.  Reviewed storage of medication in refrigerator. Reviewed that Nucala can be stored at room temperature in unopened carton for up to 7 days.  Access: Approval of Nucala through: insurance Patient enrolled into copay card program to help with copay assistance. BIN: [id number] PCN: [id number] Group: [id number] ID: [id number]  Phone #: [phone]  Patient self-administered Nucala 100mg /mL in left lower abdomen using sample Nucala 100mg /mL autoinjector pen NDC:  [phone] Lot: [id number] Expiration: 2027-JUN  Patient monitored for 30 minutes for adverse reaction.  Patient tolerated well.  Injection site noted. Patient denies itchiness and irritation at injection.  Medication Reconciliation  A drug regimen assessment was performed, including review of allergies, interactions, disease-state management, dosing and immunization history. Medications were reviewed with the patient, including name, instructions, indication, goals of therapy, potential side effects, importance of adherence, and safe use.  Drug interaction(s): none noted   PLAN Continue Nucala 100mg  every 4 weeks. Next dose is due 09/04/24 and every 4 weeks thereafter. Rx sent to: [facility]: [phone]. Patient provided with pharmacy phone number and advised to call later this week to schedule shipment to home. Patient provided with copay card information to provide to pharmacy if quoted copay exceeds $5 per month. Continue maintenance regimen of: Trelegy 200-62.5-25 mcg/act (Inhale 1 puff by mouth every day), Singulair  10mg  tab (Take 1 tab by mouth daily) Provided handouts with information for [place] Quitline. He can call 1-800-QUIT NOW. Provided handout with information on behavioral strategies to help quit smoking. He plans to meet with PCP to develop plan for tobacco cessation.  All questions encouraged and answered.  Instructed patient to reach out with any further questions or concerns.  Thank you for allowing pharmacy to participate in this patient's care.  This appointment required 45 minutes of patient care (this includes precharting, chart review, review of results, face-to-face care, etc.).

## 2024-08-07 NOTE — Patient Instructions (Signed)
 Your next Nucala dose is due on 09/04/24 and every 28 days thereafter.   CONTINUE Trelegy 200-62.5-25 mcg/act (Inhale 1 puff by mouth every day) and montelukast  (Singulair ) 10mg  tab (Take 1 tab by mouth daily)   Your prescription will be shipped from [facility]: [phone]. Please call to schedule shipment and confirm address. They will mail your medication to your home.  Your copay should be affordable. If you call the pharmacy and it is not affordable, please double-check that they are billing through your copay card as secondary coverage. That Nucala copay card information is: BIN: [id number] PCN: [id number] Group: [id number] ID: [id number]  Phone #: [phone]  You will need to be seen by your provider in 3 to 4 months to assess how Nucala is working for you. Please ensure you have a follow-up appointment scheduled in January 2026. Call our clinic if you need to make this appointment. [phone].  Stay up to date on all routine vaccines: influenza, pneumonia, COVID19, Shingles  How to manage an injection site reaction: Remember the 5 C's: COUNTER - leave on the counter at least 30 minutes but up to overnight to bring medication to room temperature. This may help prevent stinging COLD - place something cold (like an ice gel pack or cold water bottle) on the injection site just before cleansing with alcohol. This may help reduce pain CLARITIN  - use Claritin  (generic name is loratadine ) for the first two weeks of treatment or the day of, the day before, and the day after injecting. This will help to minimize injection site reactions CORTISONE CREAM - apply if injection site is irritated and itching CALL ME - if injection site reaction is bigger than the size of your fist, looks infected, blisters, or if you develop hives

## 2024-08-13 ENCOUNTER — Ambulatory Visit: Payer: Self-pay | Admitting: Pulmonary Disease

## 2024-08-20 ENCOUNTER — Ambulatory Visit

## 2024-08-20 DIAGNOSIS — J309 Allergic rhinitis, unspecified: Secondary | ICD-10-CM | POA: Diagnosis not present

## 2024-08-23 ENCOUNTER — Ambulatory Visit: Admitting: Internal Medicine

## 2024-08-23 ENCOUNTER — Encounter: Payer: Self-pay | Admitting: Internal Medicine

## 2024-08-23 ENCOUNTER — Other Ambulatory Visit: Payer: Self-pay

## 2024-08-23 VITALS — BP 118/90 | Temp 98.6°F | Resp 16 | Ht 65.0 in | Wt 167.4 lb

## 2024-08-23 DIAGNOSIS — J302 Other seasonal allergic rhinitis: Secondary | ICD-10-CM | POA: Diagnosis not present

## 2024-08-23 DIAGNOSIS — J4489 Other specified chronic obstructive pulmonary disease: Secondary | ICD-10-CM | POA: Diagnosis not present

## 2024-08-23 DIAGNOSIS — J3089 Other allergic rhinitis: Secondary | ICD-10-CM | POA: Diagnosis not present

## 2024-08-23 NOTE — Patient Instructions (Signed)
 Chronic Rhinitis Seasonal and Perennial Allergic:  - Control: Well controlled  - Prevention:  -  Continue allergen avoidance  - Consider allergy  shots as long term control of your symptoms by teaching your immune system to be more tolerant of your allergy  triggers  - Symptom control: - Consider Nasal Steroid Spray: Best results if used daily. - Options include Flonase  (fluticasone ), Nasocort (triamcinolone ), Nasonex (mometasome) 1- 2 sprays in each nostril daily.  - All can be purchased over-the-counter if not covered by insurance. - Consider Antihistamine: daily or daily as needed.   -Options include Zyrtec  (Cetirizine ) 10mg , Claritin  (Loratadine ) 10mg , Allegra (Fexofenadine) 180mg , or Xyzal  (Levocetirinze) 5mg  - Can be purchased over-the-counter if not covered by insurance.  - AIT PLAN: Continue allergy  injections per protocol - Will add epi rinses to reduce large local reactions - Continue to take antihistamine at least 1 hour prior to injections  Allergic Conjunctivitis:  - Consider Allergy  Eye drops-great options include Pataday  (Olopatadine ) or Zaditor (ketotifen) for eye symptoms daily as needed-both sold over the counter if not covered by insurance. and Rewetting Drops such as Systane,TheraTears, etc  -Avoid eye drops that say red eye relief as they may contain medications that dry out your eyes    Asthma/COPD  - Breathing test look great today - Continue all medications prescribed by pulmonary - Do not come in for allergy  injection if you are having a flare of your asthma/COPD  Follow up: 12 months   Thank you so much for letting me partake in your care today.  Don't hesitate to reach out if you have any additional concerns!  Hargis Springer, MD  [facility] [place], [place]

## 2024-08-23 NOTE — Progress Notes (Signed)
 FOLLOW UP Date of Service/Encounter:  08/23/24  Subjective:  [name] (DOB: 1970-11-11) is a 53 y.o. male who [facility] in re-evaluation of the following: rhinitis on AIT, asthma/copd  History obtained from: chart review and patient.  For Review, LV was on 03/21/2023 with Dr. Lorin seen for rush appointment. See below for summary of history and diagnostics.    ----------------------------------------------------- Pertinent History/Diagnostics:  Asthma/COPD overlap: Managed by [place] [facility] -On Trelegy -01/21/2020 CXR 2 view: Mild background emphysema without airspace disease, effusion or pneumothorax. -12/05/2020 PFTs: FVC 78, FEV1 52, ratio 52, TLC 116, DLCOcor 82.  Moderately severe obstructive airway disease with reversibility -04/27/2022 echocardiogram: EF 60-65, GIDD,  -CT 10/07/22. small 3 millimeter nodule. Lung RADS 2. Atherosclerosis.  Symptoms stable on Trelegy Allergic Rhinitis: Perennial itchy/watery eyes, nasal congestion, sneezing RUSH (03/21/23): - SPT environmental panel (02/02/23): Grass, weed, tree, mold, dust mite -Rx Flonase , Astelin , Singulair , Xyzal  Other: GERD on Protonix  --------------------------------------------------- Today presents for follow-up. Discussed the use of AI scribe software for clinical note transcription with the patient, who gave verbal consent to proceed.  History of Present Illness [name] [name] is a 53 year old male who presents for follow-up on allergy  shots.  Allergen immunotherapy reactions - Receiving allergy  shots for over one year. - 06/17/23: received 0.35mL of first red vial, 15 minutes after leaving developed pruritus of hands, feet, and neck, followed by dizziness. - Required administration of epinephrine  and Xyzal  for reaction. - Dosage was reduced to 0.1mL and build up switched to A schedule with success  - Recently experienced localized swelling at  the injection site, approximately half a golf ball in size, resolving the next day. - Swelling accompanied by pruritus, but not bothersome. - Did not take antihistamines on days of these reactions.  Allergic rhinitis symptoms - Significant improvement in allergy  symptoms since starting immunotherapy. - No issues during the past spring season, which he found remarkable compared to previous years. - not needing any medications to control symptoms  - Previously experienced severe symptoms during spring, described as miserable.  Respiratory symptoms and asthma/copd management - Follows with pulmonary for asthma/COPD management. - Development Worker, Community, Trelegy for management.  - No recent need for Duonebs. - well controlled    All medications reviewed by clinical staff and updated in chart. No new pertinent medical or surgical history except as noted in HPI.  ROS: All others negative except as noted per HPI.   Objective:  BP (!) 118/90 (BP Location: Left Arm, Patient Position: Sitting, Cuff Size: Normal)   Temp 98.6 F (37 C) (Temporal)   Resp 16   Ht 5' 5 (1.651 m)   Wt 167 lb 6.4 oz (75.9 kg)   SpO2 95%   BMI 27.86 kg/m  Body mass index is 27.86 kg/m. Physical Exam: General Appearance:  Alert, cooperative, no distress, appears stated age  Head:  Normocephalic, without obvious abnormality, atraumatic  Eyes:  Conjunctiva clear, EOM's intact  Ears EACs normal bilaterally  Nose: Nares normal, normal mucosa, no visible anterior polyps, and septum midline  Throat: Lips, tongue normal; teeth and gums normal, normal posterior oropharynx  Neck: Supple, symmetrical  Lungs:   clear to auscultation bilaterally, Respirations unlabored, no coughing  Heart:  regular rate and rhythm and no murmur, Appears well perfused  Extremities: No edema  Skin: Skin color, texture, turgor normal and no rashes or lesions on visualized portions of skin  Neurologic: No gross deficits  Labs:  Lab Orders   No laboratory test(s) ordered today    Spirometry:  Tracings reviewed. His effort: Good reproducible efforts. FVC: 3.20 L FEV1: 1.67 L, 52% predicted FEV1/FVC ratio: 52% Interpretation: Spirometry consistent with moderate obstructive disease.  Baseline Please see scanned spirometry results for details.    Assessment/Plan   Patient Instructions  Chronic Rhinitis Seasonal and Perennial Allergic:  - Control: Well controlled  - Prevention:  -  Continue allergen avoidance  - Consider allergy  shots as long term control of your symptoms by teaching your immune system to be more tolerant of your allergy  triggers  - Symptom control: - Consider Nasal Steroid Spray: Best results if used daily. - Options include Flonase  (fluticasone ), Nasocort (triamcinolone ), Nasonex (mometasome) 1- 2 sprays in each nostril daily.  - All can be purchased over-the-counter if not covered by insurance. - Consider Antihistamine: daily or daily as needed.   -Options include Zyrtec  (Cetirizine ) 10mg , Claritin  (Loratadine ) 10mg , Allegra (Fexofenadine) 180mg , or Xyzal  (Levocetirinze) 5mg  - Can be purchased over-the-counter if not covered by insurance.  - AIT PLAN: Continue allergy  injections per protocol - Will add epi rinses to reduce large local reactions - Continue to take antihistamine at least 1 hour prior to injections  Allergic Conjunctivitis:  - Consider Allergy  Eye drops-great options include Pataday  (Olopatadine ) or Zaditor (ketotifen) for eye symptoms daily as needed-both sold over the counter if not covered by insurance. and Rewetting Drops such as Systane,TheraTears, etc  -Avoid eye drops that say red eye relief as they may contain medications that dry out your eyes    Asthma/COPD  - Breathing test look great today - Continue all medications prescribed by pulmonary - Do not come in for allergy  injection if you are having a flare of your asthma/COPD  Follow up: 12 months   Thank you so much  for letting me partake in your care today.  Don't hesitate to reach out if you have any additional concerns!  Hargis Springer, MD  [facility] [place], [place]    Other:    Thank you so much for letting me partake in your care today.  Don't hesitate to reach out if you have any additional concerns!  Hargis Springer, MD  [facility] [place], [place]

## 2024-09-04 DIAGNOSIS — Z8739 Personal history of other diseases of the musculoskeletal system and connective tissue: Secondary | ICD-10-CM | POA: Diagnosis not present

## 2024-09-04 DIAGNOSIS — E785 Hyperlipidemia, unspecified: Secondary | ICD-10-CM | POA: Diagnosis not present

## 2024-09-04 DIAGNOSIS — Z125 Encounter for screening for malignant neoplasm of prostate: Secondary | ICD-10-CM | POA: Diagnosis not present

## 2024-09-10 ENCOUNTER — Other Ambulatory Visit: Payer: Self-pay | Admitting: Internal Medicine

## 2024-09-10 DIAGNOSIS — J4489 Other specified chronic obstructive pulmonary disease: Secondary | ICD-10-CM

## 2024-09-10 MED ORDER — NUCALA 100 MG/ML ~~LOC~~ SOAJ: 100.0000 mg | SUBCUTANEOUS | 3 refills | Status: DC

## 2024-09-10 NOTE — Telephone Encounter (Signed)
 Copied from CRM [id number]. Topic: Clinical - Medication Refill >> Sep 10, 2024 12:18 PM Lavanda D wrote: Medication: Liam Riggs from [facility] is calling to request a new Rx to be sent for Nucala from Dr. Geronimo  Has the patient contacted their pharmacy? Yes (Agent: If no, request that the patient contact the pharmacy for the refill. If patient does not wish to contact the pharmacy document the reason why and proceed with request.) (Agent: If yes, when and what did the pharmacy advise?)  This is the patient's preferred pharmacy:  [facility] - [place], [place] - [address] [facility] [address] [place] [address] Phone: [phone] Fax: [phone]  Is this the correct pharmacy for this prescription? Yes If no, delete pharmacy and type the correct one.   Has the prescription been filled recently? No  Is the patient out of the medication? Unsure  Has the patient been seen for an appointment in the last year OR does the patient have an upcoming appointment? Yes  Can we respond through MyChart? No  Agent: Please be advised that Rx refills may take up to 3 business days. We ask that you follow-up with your pharmacy.

## 2024-09-10 NOTE — Telephone Encounter (Signed)
 Refill sent for NUCALA to [facility]: [phone]  Dose: 100mg  [place] every 28 days   Last OV: 07/31/2024 Provider: Dr. Theophilus  Next OV: due around 10/31/24, not yet scheduled.   Aleck Puls, PharmD, BCPS Clinical Pharmacist  [facility]

## 2024-09-10 NOTE — Telephone Encounter (Signed)
 Pt requesting refill of specialty medication - routing to Rx team to advise.

## 2024-09-11 DIAGNOSIS — Z0000 Encounter for general adult medical examination without abnormal findings: Secondary | ICD-10-CM | POA: Diagnosis not present

## 2024-09-11 DIAGNOSIS — E785 Hyperlipidemia, unspecified: Secondary | ICD-10-CM | POA: Diagnosis not present

## 2024-09-11 DIAGNOSIS — Z1331 Encounter for screening for depression: Secondary | ICD-10-CM | POA: Diagnosis not present

## 2024-09-11 DIAGNOSIS — Z1339 Encounter for screening examination for other mental health and behavioral disorders: Secondary | ICD-10-CM | POA: Diagnosis not present

## 2024-09-16 ENCOUNTER — Ambulatory Visit (INDEPENDENT_AMBULATORY_CARE_PROVIDER_SITE_OTHER)

## 2024-09-16 DIAGNOSIS — J309 Allergic rhinitis, unspecified: Secondary | ICD-10-CM

## 2024-09-16 MED ORDER — NUCALA 100 MG/ML ~~LOC~~ SOAJ: 100.0000 mg | SUBCUTANEOUS | 3 refills | Status: AC

## 2024-09-16 NOTE — Telephone Encounter (Signed)
 Called [facility] to give verbal order for Nucala .

## 2024-09-16 NOTE — Addendum Note (Signed)
 Addended by: Kari Montero L on: 09/16/2024 01:45 PM   Modules accepted: Orders

## 2024-09-16 NOTE — Telephone Encounter (Signed)
 Copied from CRM [id number]. Topic: Clinical - Prescription Issue >> Sep 13, 2024  2:33 PM Lavanda D wrote: Reason for CRM: Julian with [facility] is calling because they have not yet received the prescription for Nucala , shows sent on 11/18 but they do not see it on their end. Requesting rx to be resent.  Fax #: [phone] CB #: [phone] Option 1 & Option 6  Please advise.

## 2024-09-16 NOTE — Telephone Encounter (Signed)
 Copied from CRM [id number]. Topic: Clinical - Medication Question >> Sep 16, 2024  2:36 PM Isabell A wrote: Reason for CRM: Patient is requesting to speak with Aleck.  Callback number: [phone]  Routing to [place].

## 2024-09-17 NOTE — Telephone Encounter (Signed)
 Called patient yesterday after 4PM. Patient reports [facility] told him they did not have what they needed. Confirmed that his last contact with [facility] was after I gave the verbal order.   Called [facility] to find out what is missing. They report they have what they need and received my verbal order. They attempted to reach patient this morning for scheduling shipment but were unable to reach patient. They will make another attempt.   MyChart message sent.  NFN.

## 2024-09-23 ENCOUNTER — Telehealth: Admitting: Family Medicine

## 2024-09-23 ENCOUNTER — Encounter: Payer: Self-pay | Admitting: Pulmonary Disease

## 2024-09-23 ENCOUNTER — Ambulatory Visit

## 2024-09-23 DIAGNOSIS — J069 Acute upper respiratory infection, unspecified: Secondary | ICD-10-CM | POA: Diagnosis not present

## 2024-09-23 DIAGNOSIS — J309 Allergic rhinitis, unspecified: Secondary | ICD-10-CM | POA: Diagnosis not present

## 2024-09-23 MED ORDER — PROMETHAZINE-DM 6.25-15 MG/5ML PO SYRP: 5.0000 mL | ORAL_SOLUTION | Freq: Four times a day (QID) | ORAL | 0 refills | Status: AC | PRN

## 2024-09-23 MED ORDER — BENZONATATE 100 MG PO CAPS: 100.0000 mg | ORAL_CAPSULE | Freq: Three times a day (TID) | ORAL | 0 refills | Status: AC | PRN

## 2024-09-23 MED ORDER — PREDNISONE 10 MG PO TABS: 40.0000 mg | ORAL_TABLET | Freq: Every day | ORAL | 0 refills | Status: AC

## 2024-09-23 NOTE — Telephone Encounter (Signed)
 It is okay to take the Tessalon .  It sounds like you may also need some prednisone  for wheezing and asthma, COPD exacerbation.  I have sent in a prescription for prednisone  to your pharmacy.

## 2024-09-23 NOTE — Progress Notes (Signed)

## 2024-09-30 ENCOUNTER — Ambulatory Visit (INDEPENDENT_AMBULATORY_CARE_PROVIDER_SITE_OTHER)

## 2024-09-30 DIAGNOSIS — J309 Allergic rhinitis, unspecified: Secondary | ICD-10-CM | POA: Diagnosis not present

## 2024-10-29 ENCOUNTER — Ambulatory Visit: Admitting: Pulmonary Disease

## 2024-10-29 ENCOUNTER — Encounter: Payer: Self-pay | Admitting: Pulmonary Disease

## 2024-10-29 VITALS — BP 124/81 | HR 76 | Temp 98.1°F | Ht 66.0 in | Wt 169.0 lb

## 2024-10-29 DIAGNOSIS — R918 Other nonspecific abnormal finding of lung field: Secondary | ICD-10-CM

## 2024-10-29 DIAGNOSIS — F17210 Nicotine dependence, cigarettes, uncomplicated: Secondary | ICD-10-CM | POA: Diagnosis not present

## 2024-10-29 DIAGNOSIS — J45909 Unspecified asthma, uncomplicated: Secondary | ICD-10-CM

## 2024-10-29 DIAGNOSIS — J449 Chronic obstructive pulmonary disease, unspecified: Secondary | ICD-10-CM | POA: Diagnosis not present

## 2024-10-29 DIAGNOSIS — R911 Solitary pulmonary nodule: Secondary | ICD-10-CM

## 2024-10-29 DIAGNOSIS — J4489 Other specified chronic obstructive pulmonary disease: Secondary | ICD-10-CM

## 2024-10-29 DIAGNOSIS — J4540 Moderate persistent asthma, uncomplicated: Secondary | ICD-10-CM

## 2024-10-29 NOTE — Patient Instructions (Signed)
" °  VISIT SUMMARY: Today, you had a follow-up appointment to discuss your respiratory condition, including emphysema and asthma. We reviewed your current treatment plan and discussed your progress with smoking reduction.  YOUR PLAN: ASTHMA: Your asthma is well-managed with Nucala  injections and allergy  shots. You have not had any recent exacerbations requiring prednisone . -Continue Nucala  injections as prescribed. -Continue with your allergy  shots.  EMPHYSEMA: Your CT scan from July showed emphysema with stable lung nodules. Your lungs are clear on examination. -Continue with your current management regimen.  PULMONARY NODULES: Your lung nodules are stable, measuring up to 2.5 mm as per the July CT scan. -No changes needed at this time.  NICOTINE DEPENDENCE: You are currently smoking less than half a pack per day and are making efforts to reduce smoking. -Continue your efforts to reduce smoking.                               Contains text generated by [employer].   "

## 2024-10-29 NOTE — Progress Notes (Signed)
 [name]    [id number]    Jan 21, 1971  Primary Care Physician:Skakle, Massie, DO  Referring Physician: Valentin Massie, DO [address] [place],  [place] [address]  Chief complaint:  Follow-up for COPD, asthma Started Nucala  in October 2025  HPI: 54 y.o.  with past medical history of COPD, asthma, ADHD. He has a history of childhood asthma.   Complains of bronchitis with increasing cough, congestion, sputum for the past 2 months.  He had been given several rounds of prednisone .  Started on [employer] generic inhaler 2 months ago. States that he is somewhat better but still continues to have symptoms of congestion, dyspnea on exertion, cough with white mucus. Has significant history of allergies, sinus disease and GERD  He has [facility] [place] [facility] September 2024 Had 2 exacerbations in 2025 requiring hospitalization triggered by flu and metapneumovirus. Initiated Nucala  treatment in October 2025  Interim history: Discussed the use of AI scribe software for clinical note transcription with the patient, who gave verbal consent to proceed. History of Present Illness [name] is a 54 year old male with emphysema who presents for follow-up of his respiratory condition.  Dyspnea and functional status - Exertional shortness of breath persists - Requires pacing of activities due to dyspnea - Uncertain if breathing has improved overall since starting current treatment, but perceives possible improvement  Emphysema and pulmonary nodules - CT from July showed emphysema - Stable lung nodules up to 2.5 mm  Respiratory therapy and medication response - On Nucala  injections since October; has administered two doses at home - No need for prednisone  since starting Nucala  - Continues allergy  immunotherapy (allergy  shots)  Tobacco use - Cutting back on smoking - Currently smoking less than half  a pack per day, though occasionally smokes more   Relevant pulmonary history Pets: Has a dog, no cats Occupation: Works as a production designer, 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  No mold, hot tub, Jacuzzi, humidifier Smoking history: 30-pack-year smoker.  Quit in 2017.  Resumed smoking in November 2024 Travel history: No significant travel Relevant family history: Family history of emphysema.  Outpatient Encounter Medications as of 10/29/2024  Medication Sig   acetaminophen  (TYLENOL ) 500 MG tablet Take 1,000 mg by mouth every 6 (six) hours as needed for fever or mild pain (pain score 1-3).   Albuterol -Budesonide  (AIRSUPRA ) 90-80 MCG/ACT AERO Inhale 2 puffs into the lungs every 4 (four) hours as needed.   benzonatate  (TESSALON ) 100 MG capsule Take 1 capsule (100 mg total) by mouth 3 (three) times daily as needed for cough.   [employer] OLOPATADINE  HCL 0.2 % SOLN Place 1 drop into both eyes 2 (two) times daily as needed (for allergies).   EPINEPHrine  0.3 mg/0.3 mL IJ SOAJ injection Inject 0.3 mg into the muscle as needed for anaphylaxis.   glucosamine-chondroitin 500-400 MG tablet Take 2 tablets by mouth every evening.   ipratropium-albuterol  (DUONEB) 0.5-2.5 (3) MG/3ML SOLN Take 3 mLs by nebulization every 6 (six) hours as needed (shortness of breath).   levocetirizine (XYZAL ) 5 MG tablet Take 1 tablet (5 mg total) by mouth every evening.   Mepolizumab  (NUCALA ) 100 MG/ML SOAJ Inject 1 mL (100 mg total) into the skin every 28 (twenty-eight) days.   montelukast  (SINGULAIR ) 10 MG tablet TAKE 1  TABLET BY MOUTH EVERYDAY AT BEDTIME   Multiple Vitamin (MULTIVITAMIN WITH MINERALS) TABS tablet Take 1 tablet by mouth every evening.   pantoprazole  (PROTONIX ) 40 MG tablet TAKE 1 TABLET BY MOUTH EVERY DAY   promethazine -dextromethorphan  (PROMETHAZINE -DM) 6.25-15 MG/5ML syrup Take 5 mLs by mouth 4 (four) times daily as needed for  cough.   TRELEGY ELLIPTA  200-62.5-25 MCG/ACT AEPB INHALE 1 PUFF BY MOUTH EVERY DAY   UNKNOWN TO PATIENT Take 1 tablet by mouth See admin instructions. Unnamed OTC allergy  tablet- Take 1 tablet by mouth once a day as needed for allergies   fluticasone  (FLONASE ) 50 MCG/ACT nasal spray Place 1 spray into both nostrils daily. (Patient not taking: Reported on 10/29/2024)   predniSONE  (DELTASONE ) 10 MG tablet Prednisone  40 mg po daily x 1 day then Prednisone  30 mg po daily x 1 day then Prednisone  20 mg po daily x 1 day then Prednisone  10 mg daily x 1 day then stop... (Patient not taking: Reported on 07/31/2024)   Facility-Administered Encounter Medications as of 10/29/2024  Medication   EPINEPHrine  (EPI-PEN) injection 0.3 mg   Vitals:   10/29/24 1521  BP: 124/81  Pulse: 76  Temp: 98.1 F (36.7 C)  Height: 5' 6 (1.676 m)  Weight: 169 lb (76.7 kg)  SpO2: 92%  TempSrc: Oral  BMI (Calculated): 27.29     Physical Exam GEN: No acute distress CV: Regular rate and rhythm no murmurs LUNGS: Clear to auscultation bilaterally normal respiratory effort SKIN JOINTS: Warm and dry no rash    Data Reviewed: Imaging: Chest x-ray 09/22/2018- clear lungs with no airspace consolidation or effusion.  No active cardiopulmonary disease. CT lung cancer screening 10/09/2022-stable pulmonary nodule, mild emphysema. CT lung cancer screening 04/29/2024-centrilobular and paraseptal emphysema.  Calcified granulomas and stable lung nodule I had reviewed the images personally.  PFTs: 12/05/2018 FVC 3.43 [78%), FEV1 1.77 [52%], F/F 52, TLC 116, DLCO 82% Severe obstruction with air trapping and bronchodilator response  ACQ score 12/05/2018- 2.86 ACT score 07/31/2024-17  FENO  12/05/2018-32 07/31/2024-14  Labs: CBC 09/22/2018-WBC 8.9, eos 3%, absolute eosinophil count 267 CBC 11/19/2018-WBC 12.2, eos 1.8%, absolute eosinophil count 220 CBC 07/31/2024-WBC 7.3, eos 1.5%, absolute eosinophil count 110  IgE 11/19/2018-  56 IgE 07/31/2024-200  Assessment & Plan Asthma and chronic obstructive pulmonary disease (COPD) with exacerbations Well-managed with Nucala  since October 2025. No recent exacerbations requiring prednisone . Continues allergy  shots.  Still has exertional dyspnea and discussed that full benefit of Nucala  may take up to six months. - Continue Nucala  injections - Continue Trelegy, Singulair , Airsupra  as needed - Continue allergy  shots for allergic rhinitis  Pulmonary nodules Lung nodules are stable, measuring up to 2.5 mm Continue annual screening.  Being ordered by primary care  Nicotine dependence Continues to smoke less than half a pack per day. Attempting to reduce smoking independently. No current need for cessation assistance.  Time spent counseling-5 minutes.  Reassess at return visit - Encouraged continued efforts to reduce smoking  Lung Nodules Stable on previous screening CT. Next screening CT scheduled for 10/23/2023. -Continue with scheduled CT scan.  Plan/Recommendations: Trelegy, Singulair , Airsupra  Nucala  injection Smoking cessation  Leroy Pettway MD [facility] 10/29/2024, 3:44 PM  CC: Valentin Skates, DO  Cardiac "

## 2024-11-04 ENCOUNTER — Ambulatory Visit

## 2024-11-04 DIAGNOSIS — J302 Other seasonal allergic rhinitis: Secondary | ICD-10-CM

## 2024-11-07 ENCOUNTER — Other Ambulatory Visit: Payer: Self-pay | Admitting: Nurse Practitioner

## 2024-11-07 DIAGNOSIS — K219 Gastro-esophageal reflux disease without esophagitis: Secondary | ICD-10-CM

## 2024-11-08 NOTE — Telephone Encounter (Signed)
 Pt requesting refill for Pantoprazole .  Med not listed in lov.  Please advise Dr. Theophilus

## 2025-08-22 ENCOUNTER — Ambulatory Visit: Admitting: Internal Medicine
